# Patient Record
Sex: Female | Born: 1960 | Hispanic: No | Marital: Single | Smoking: Former smoker
Health system: Southern US, Community
[De-identification: ages and names within clinical notes are randomized; demographics above are authoritative.]

## PROBLEM LIST (undated history)

## (undated) DIAGNOSIS — N200 Calculus of kidney: Secondary | ICD-10-CM

## (undated) DIAGNOSIS — E785 Hyperlipidemia, unspecified: Secondary | ICD-10-CM

## (undated) DIAGNOSIS — J45909 Unspecified asthma, uncomplicated: Secondary | ICD-10-CM

## (undated) DIAGNOSIS — I10 Essential (primary) hypertension: Secondary | ICD-10-CM

## (undated) DIAGNOSIS — G4730 Sleep apnea, unspecified: Secondary | ICD-10-CM

## (undated) DIAGNOSIS — N189 Chronic kidney disease, unspecified: Secondary | ICD-10-CM

## (undated) DIAGNOSIS — E119 Type 2 diabetes mellitus without complications: Secondary | ICD-10-CM

## (undated) DIAGNOSIS — I8290 Acute embolism and thrombosis of unspecified vein: Secondary | ICD-10-CM

## (undated) HISTORY — PX: TUBAL LIGATION: SHX77

## (undated) HISTORY — DX: Calculus of kidney: N20.0

## (undated) HISTORY — PX: CHOLECYSTECTOMY: SHX55

## (undated) HISTORY — DX: Acute embolism and thrombosis of unspecified vein: I82.90

## (undated) HISTORY — PX: ABLATION: SHX5711

## (undated) HISTORY — PX: ABDOMINAL HYSTERECTOMY: SHX81

## (undated) HISTORY — PX: REDUCTION MAMMAPLASTY: SUR839

## (undated) HISTORY — PX: ROTATOR CUFF REPAIR: SHX139

## (undated) HISTORY — DX: Chronic kidney disease, unspecified: N18.9

## (undated) HISTORY — DX: Type 2 diabetes mellitus without complications: E11.9

---

## 2000-02-23 ENCOUNTER — Encounter: Admission: RE | Admit: 2000-02-23 | Discharge: 2000-02-23 | Payer: Self-pay | Admitting: Neurosurgery

## 2015-02-24 ENCOUNTER — Emergency Department
Admit: 2015-02-24 | Disposition: A | Discharge status: Home / Self Care | Payer: Self-pay | Admitting: Emergency Medicine

## 2015-02-24 ENCOUNTER — Encounter: Payer: Self-pay | Admitting: Physician Assistant

## 2015-02-24 IMAGING — CR RIGHT FOOT COMPLETE - 3+ VIEW
1 series · 3 of 3 positions shown · non-contrast
Comparison: None.

CLINICAL DATA: Fell outside on sidewalk around 6pm. Pain right
lateral foot.

EXAM:
RIGHT FOOT COMPLETE - 3+ VIEW; RIGHT ANKLE - COMPLETE 3+ VIEW

[Series 1: dxr foot rt complete w/obliques · 0.14mm/px · 3 of 3 slices shown]
[im 1/3]
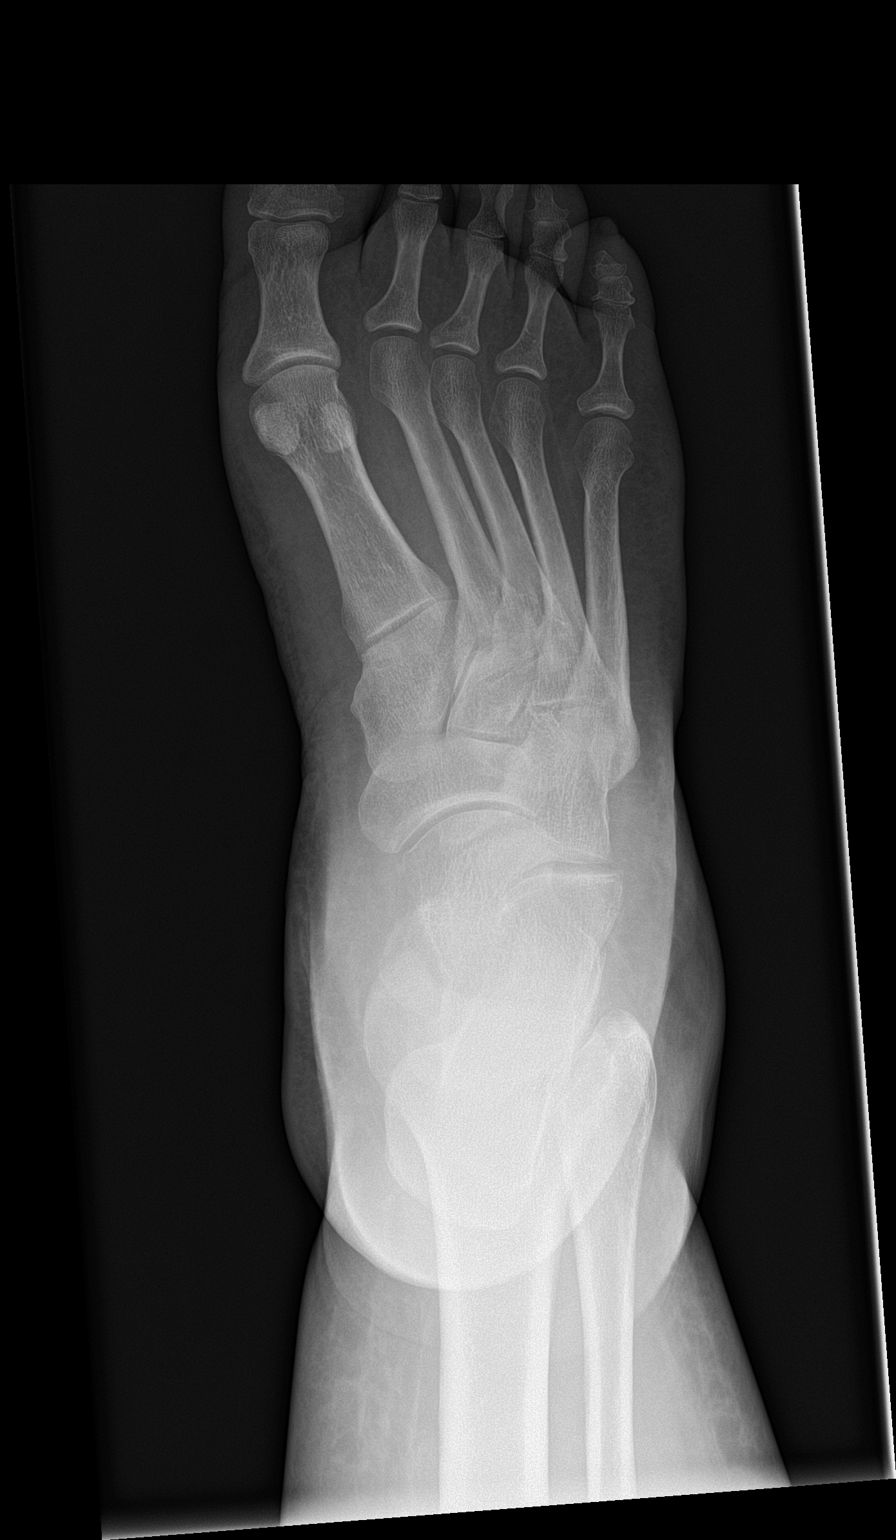
[im 2/3]
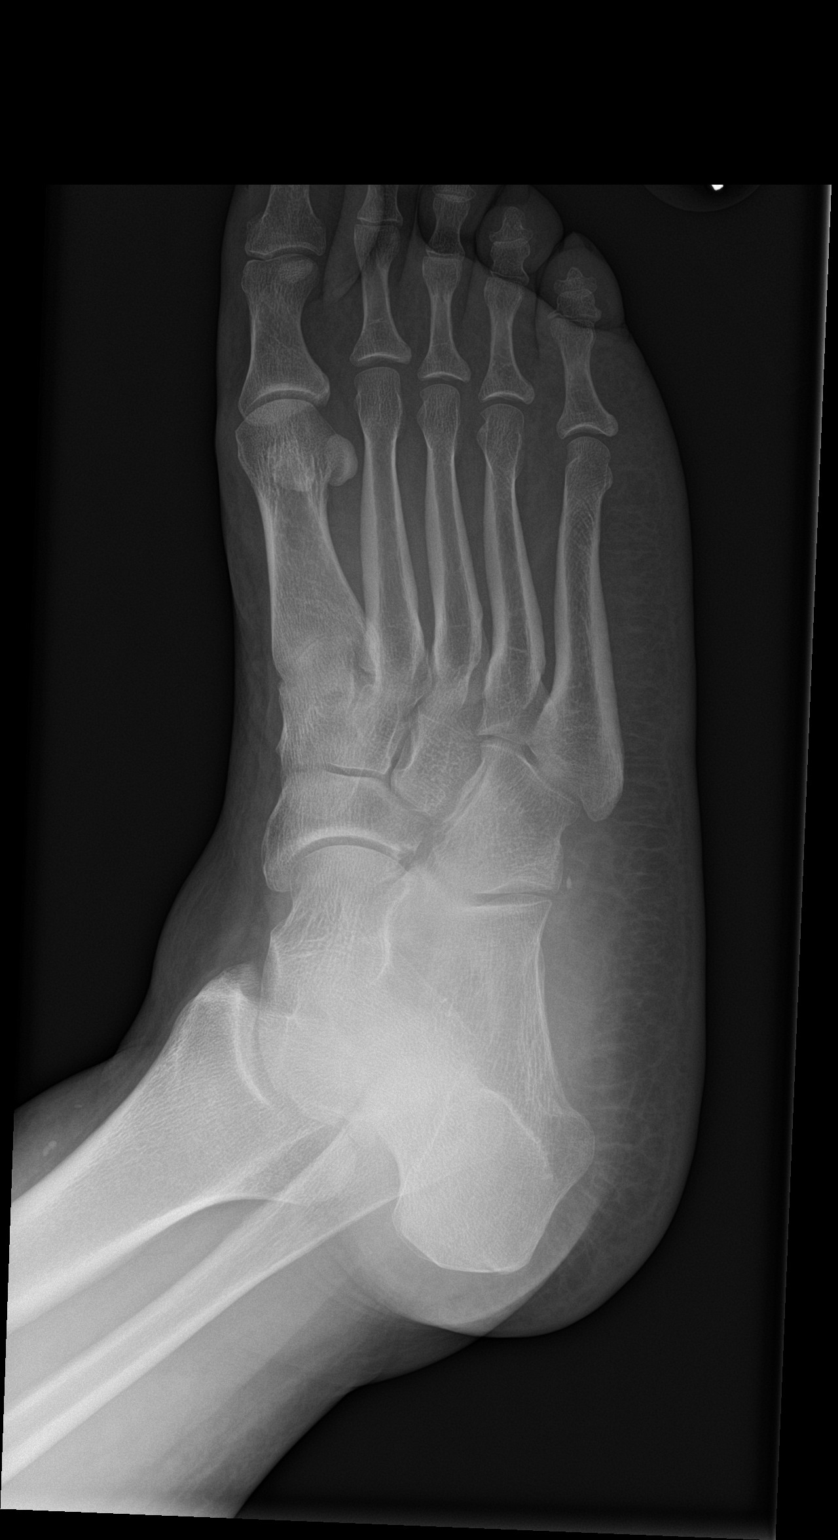
[im 3/3]
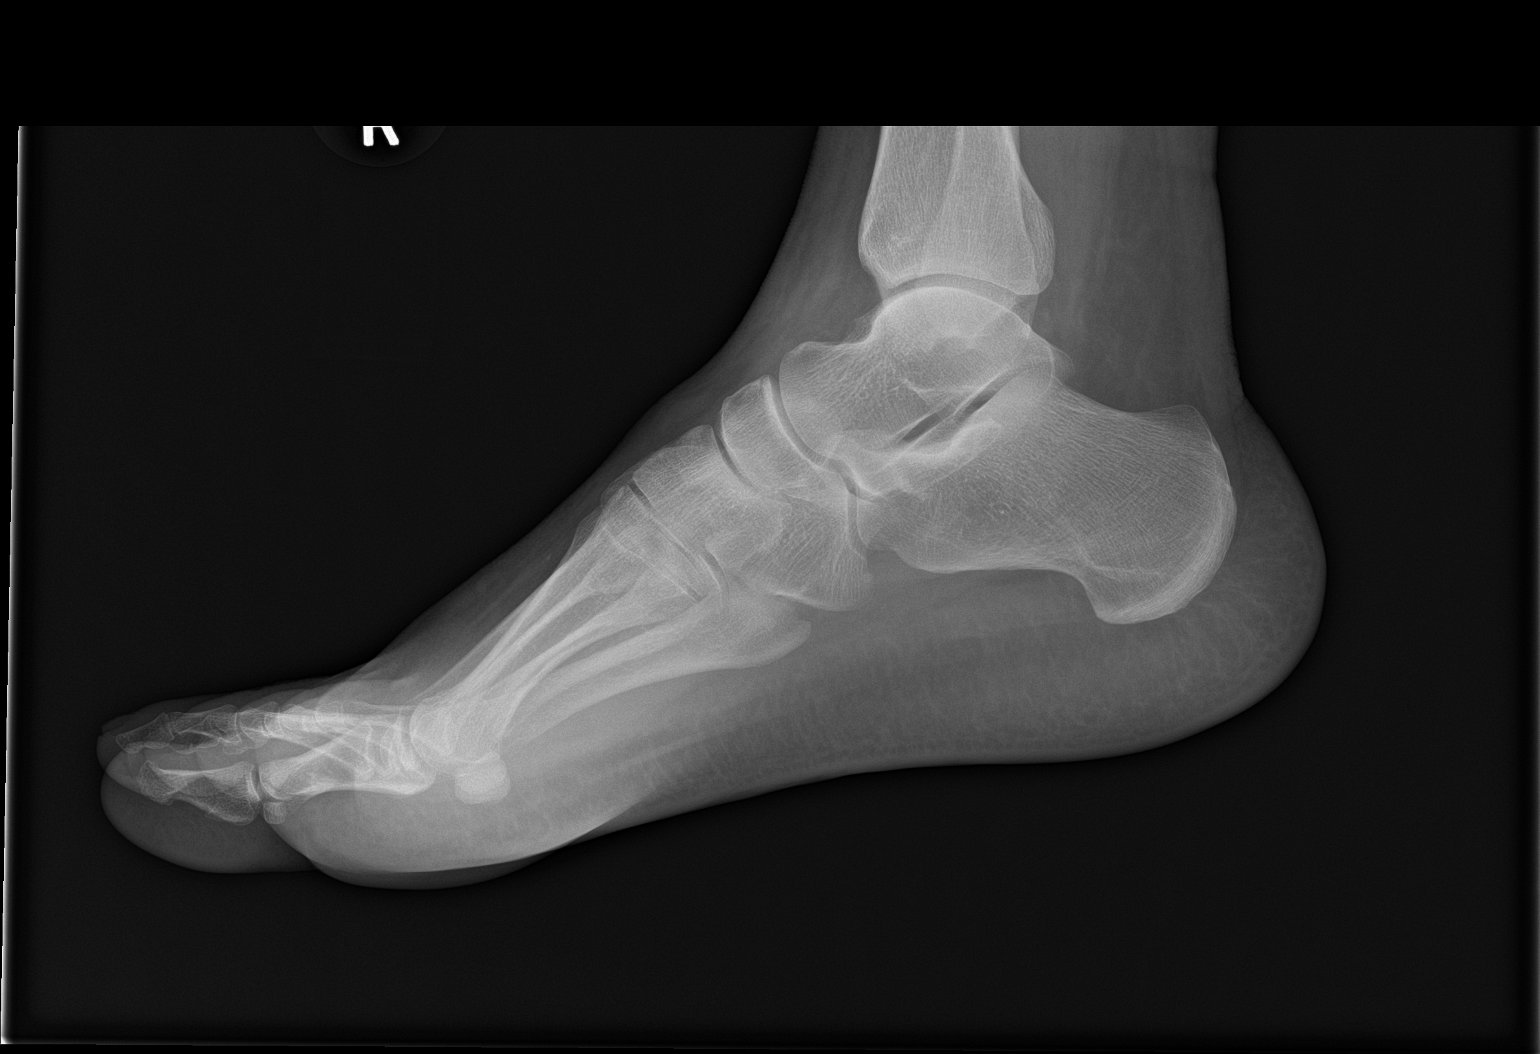

[3 of 3 positions shown; findings below may reference images not displayed]

FINDINGS: No fracture deformity nor dislocation. Tiny os perineum. The ankle
mortise appears congruent and the tibiofibular syndesmosis intact.
No destructive bony lesions. Lateral ankle soft tissue swelling
without subcutaneous gas or radiopaque foreign bodies. Phleboliths
in the pretibial soft tissues.
IMPRESSION: No acute osseous process.

By: Ohaila Crash

## 2015-02-24 IMAGING — CR DG KNEE COMPLETE 4+V*R*
1 series · 4 of 4 positions shown · non-contrast
Comparison: None.

CLINICAL DATA: pt fell on the side walk around 6pm. small scrape
anterior right knee, pain entire right knee, unable to bear weight.

EXAM:
RIGHT KNEE - COMPLETE 4+ VIEW

[Series 1: dxr knee rt comp with obliques · 0.14mm/px · 4 of 4 slices shown]
[im 1/4]
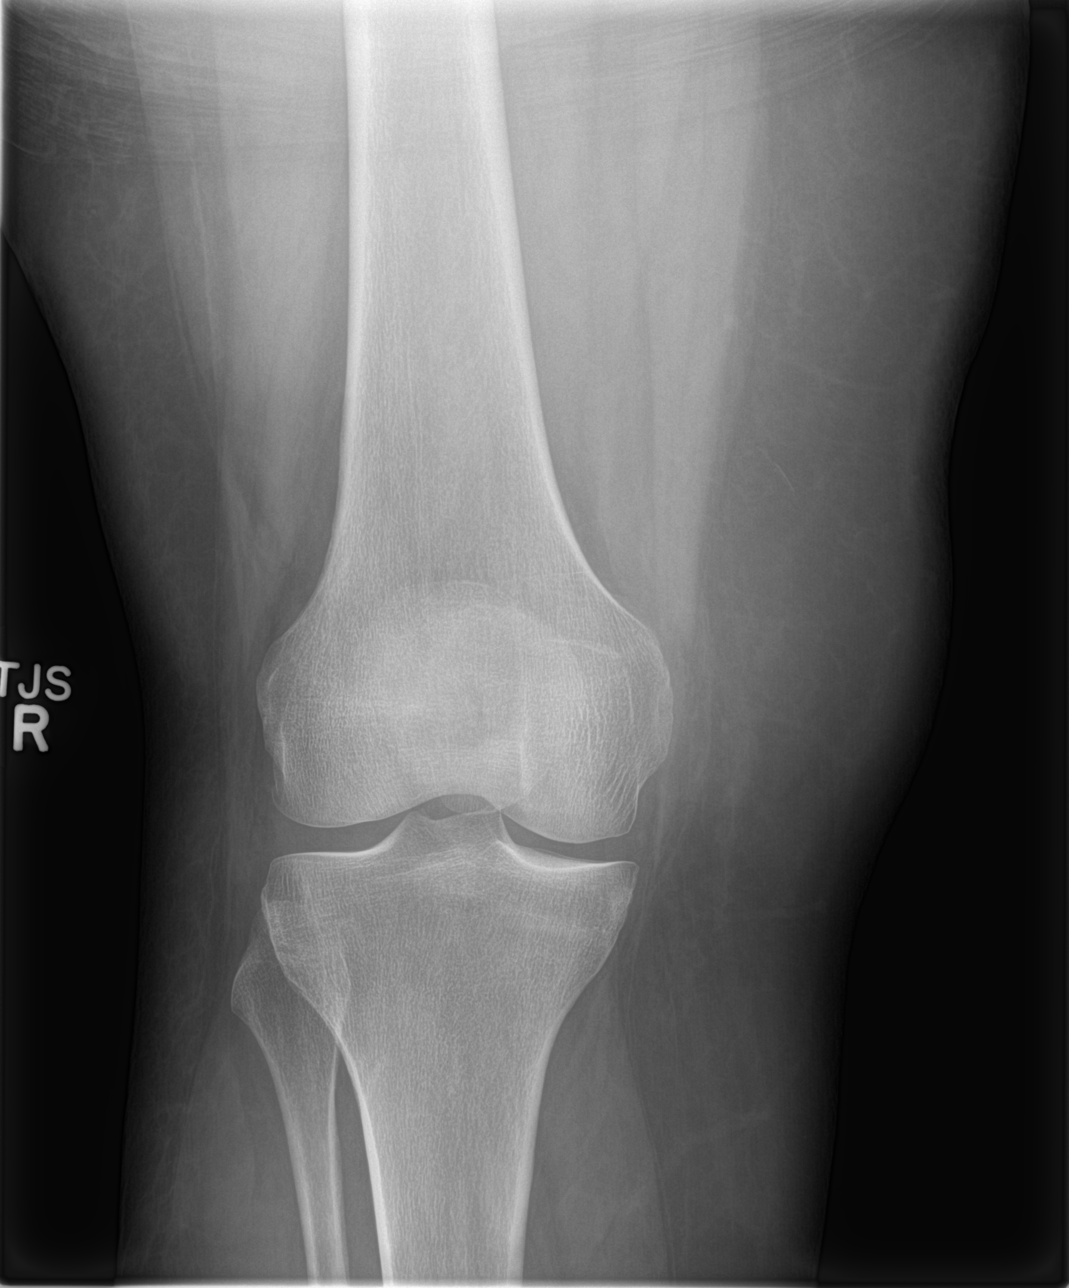
[im 2/4]
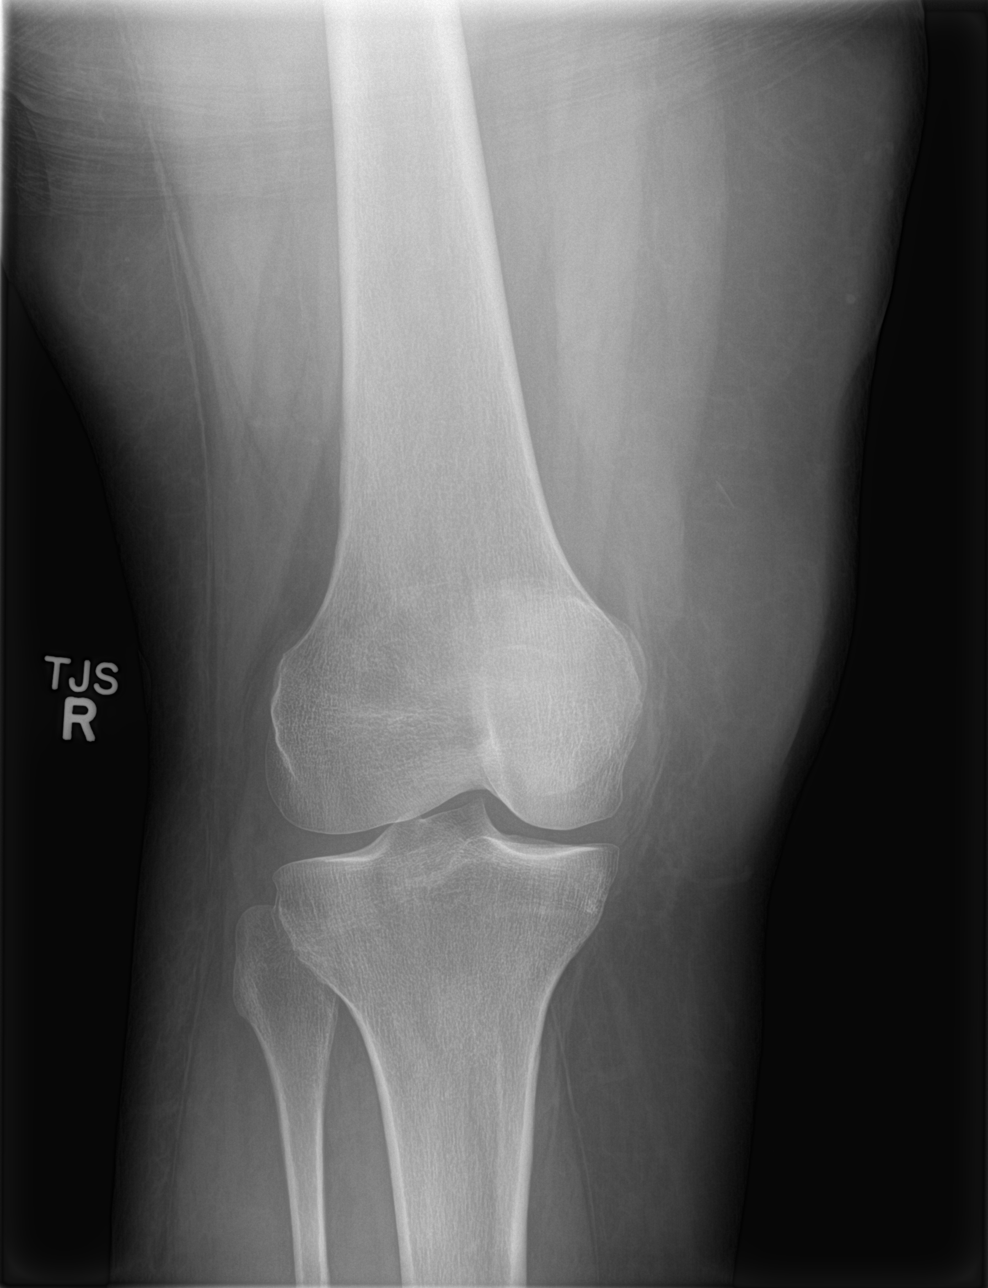
[im 3/4]
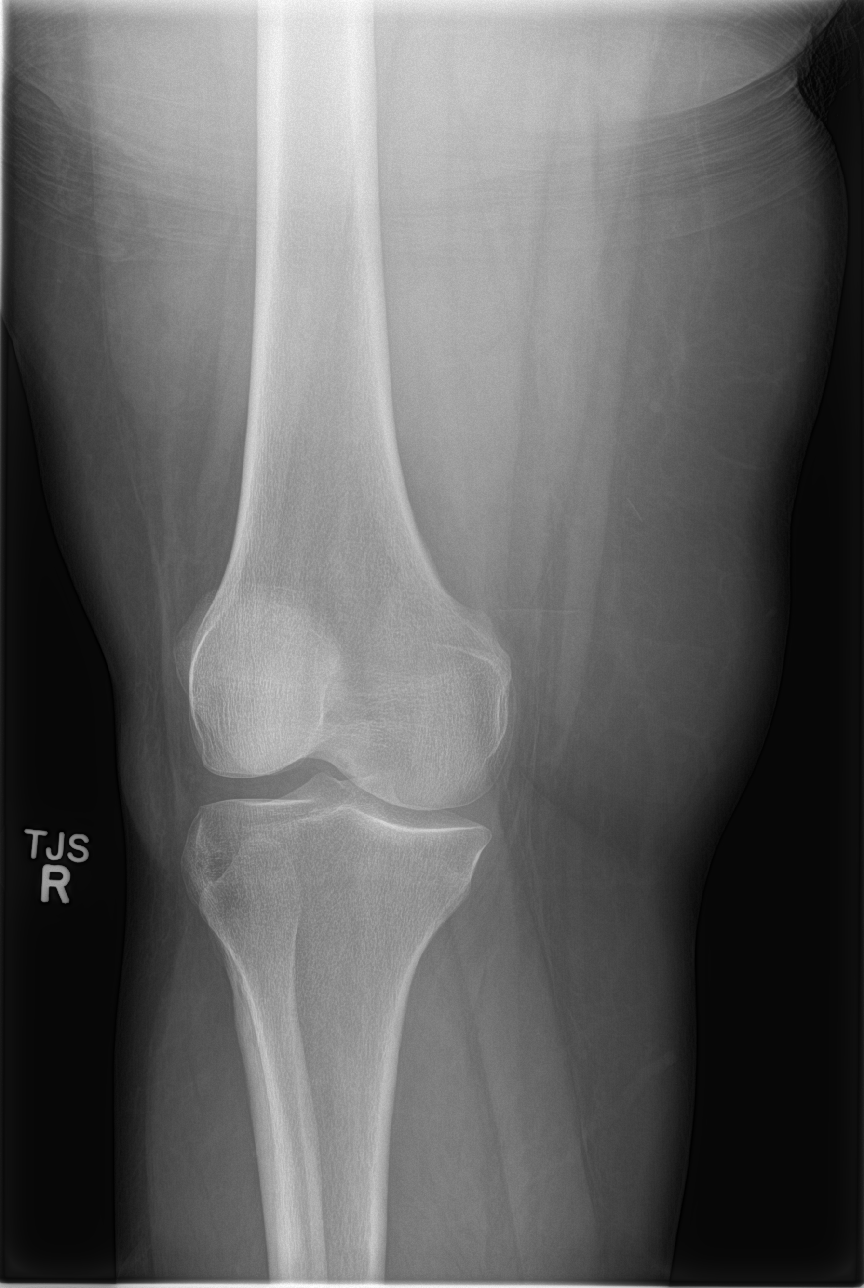
[im 4/4]
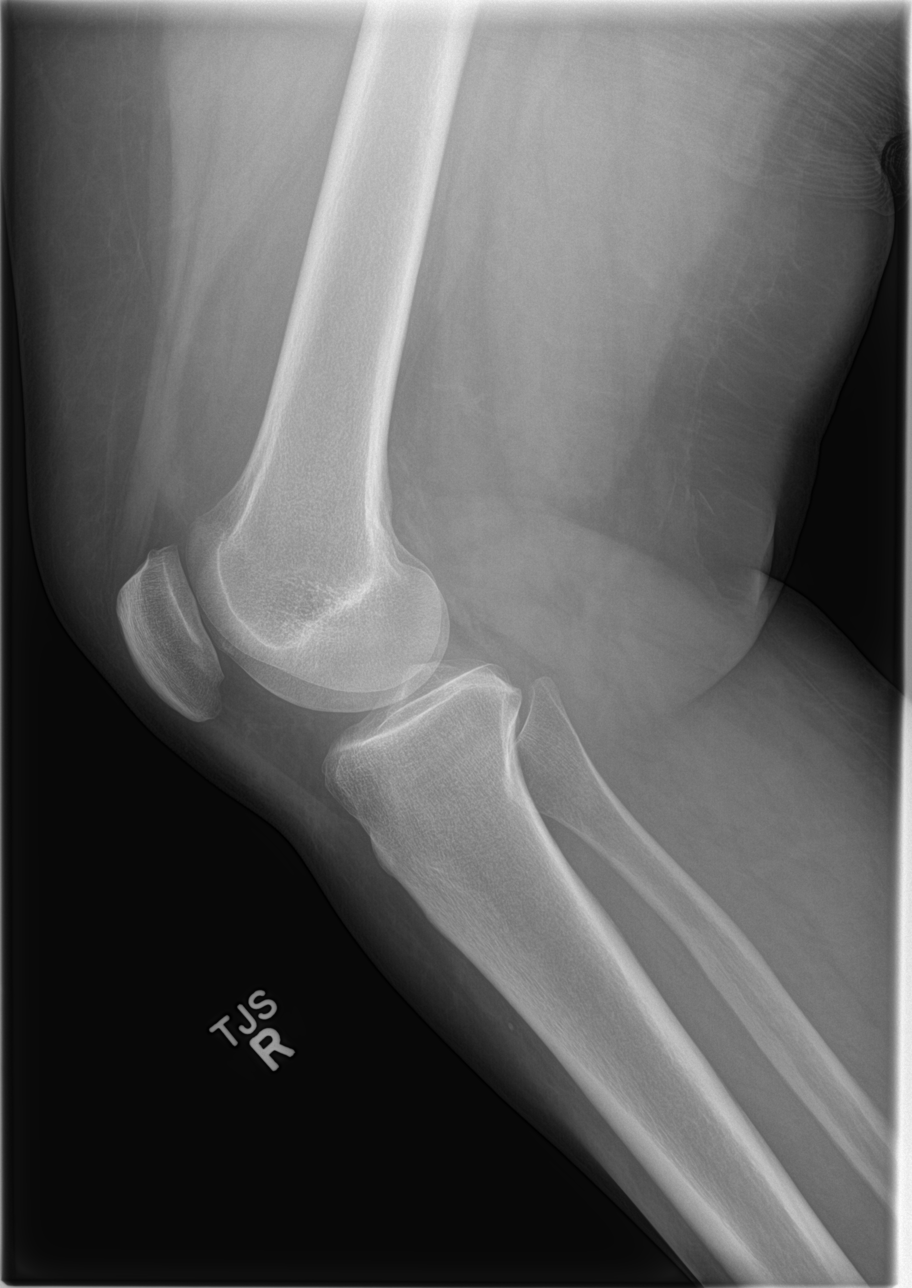

[4 of 4 positions shown; findings below may reference images not displayed]

FINDINGS: There is no evidence of fracture, dislocation, or joint effusion.
There is no evidence of arthropathy or other focal bone abnormality.
Soft tissues are unremarkable.
IMPRESSION: Negative.

## 2015-02-24 IMAGING — CR RIGHT ANKLE - COMPLETE 3+ VIEW
1 series · 3 of 3 positions shown · non-contrast
Comparison: None.

CLINICAL DATA: Fell outside on sidewalk around 6pm. Pain right
lateral foot.

EXAM:
RIGHT FOOT COMPLETE - 3+ VIEW; RIGHT ANKLE - COMPLETE 3+ VIEW

[Series 1: dxr ankle right complete · 0.14mm/px · 3 of 3 slices shown]
[im 1/3]
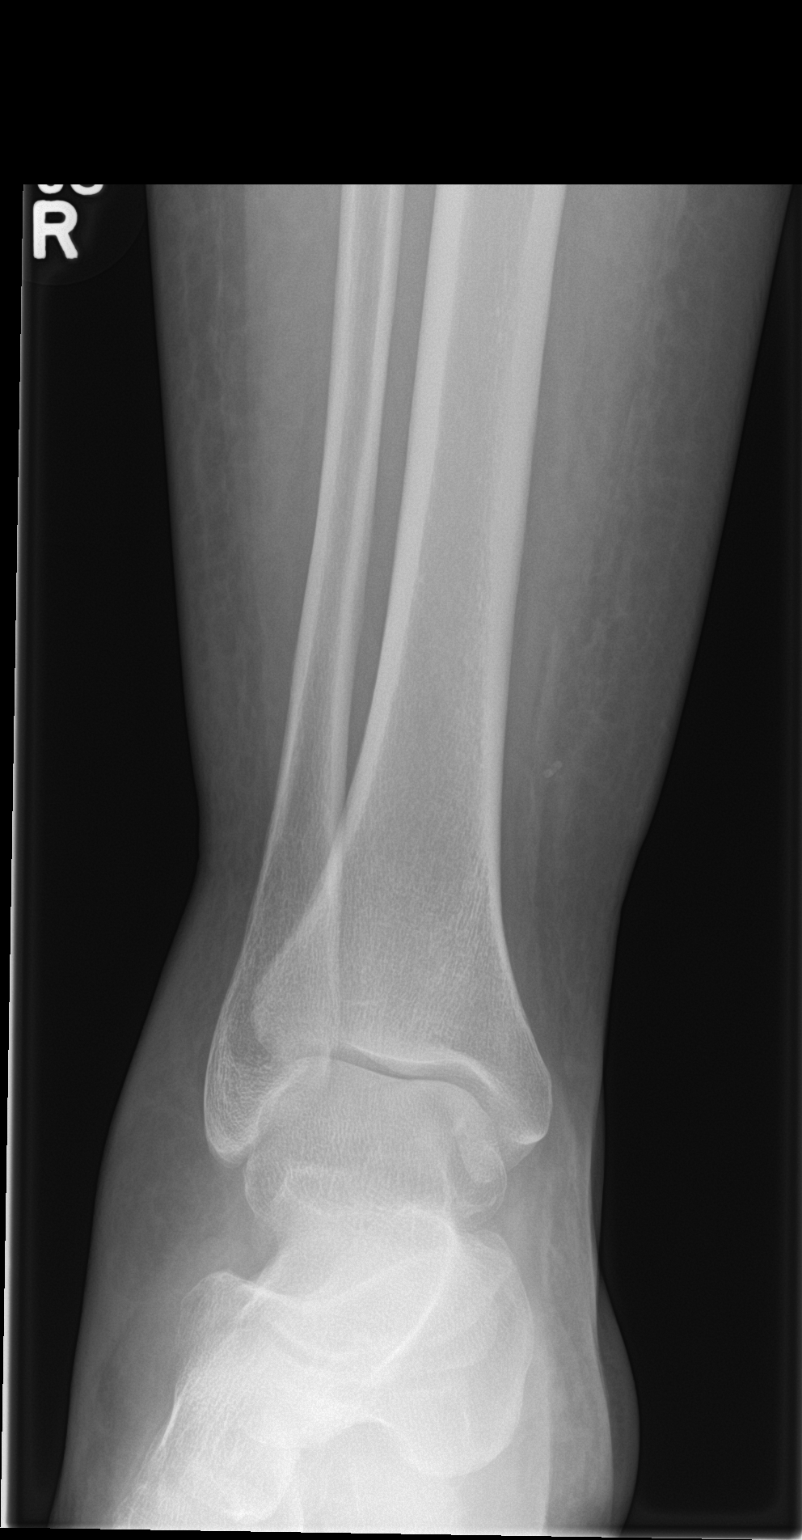
[im 2/3]
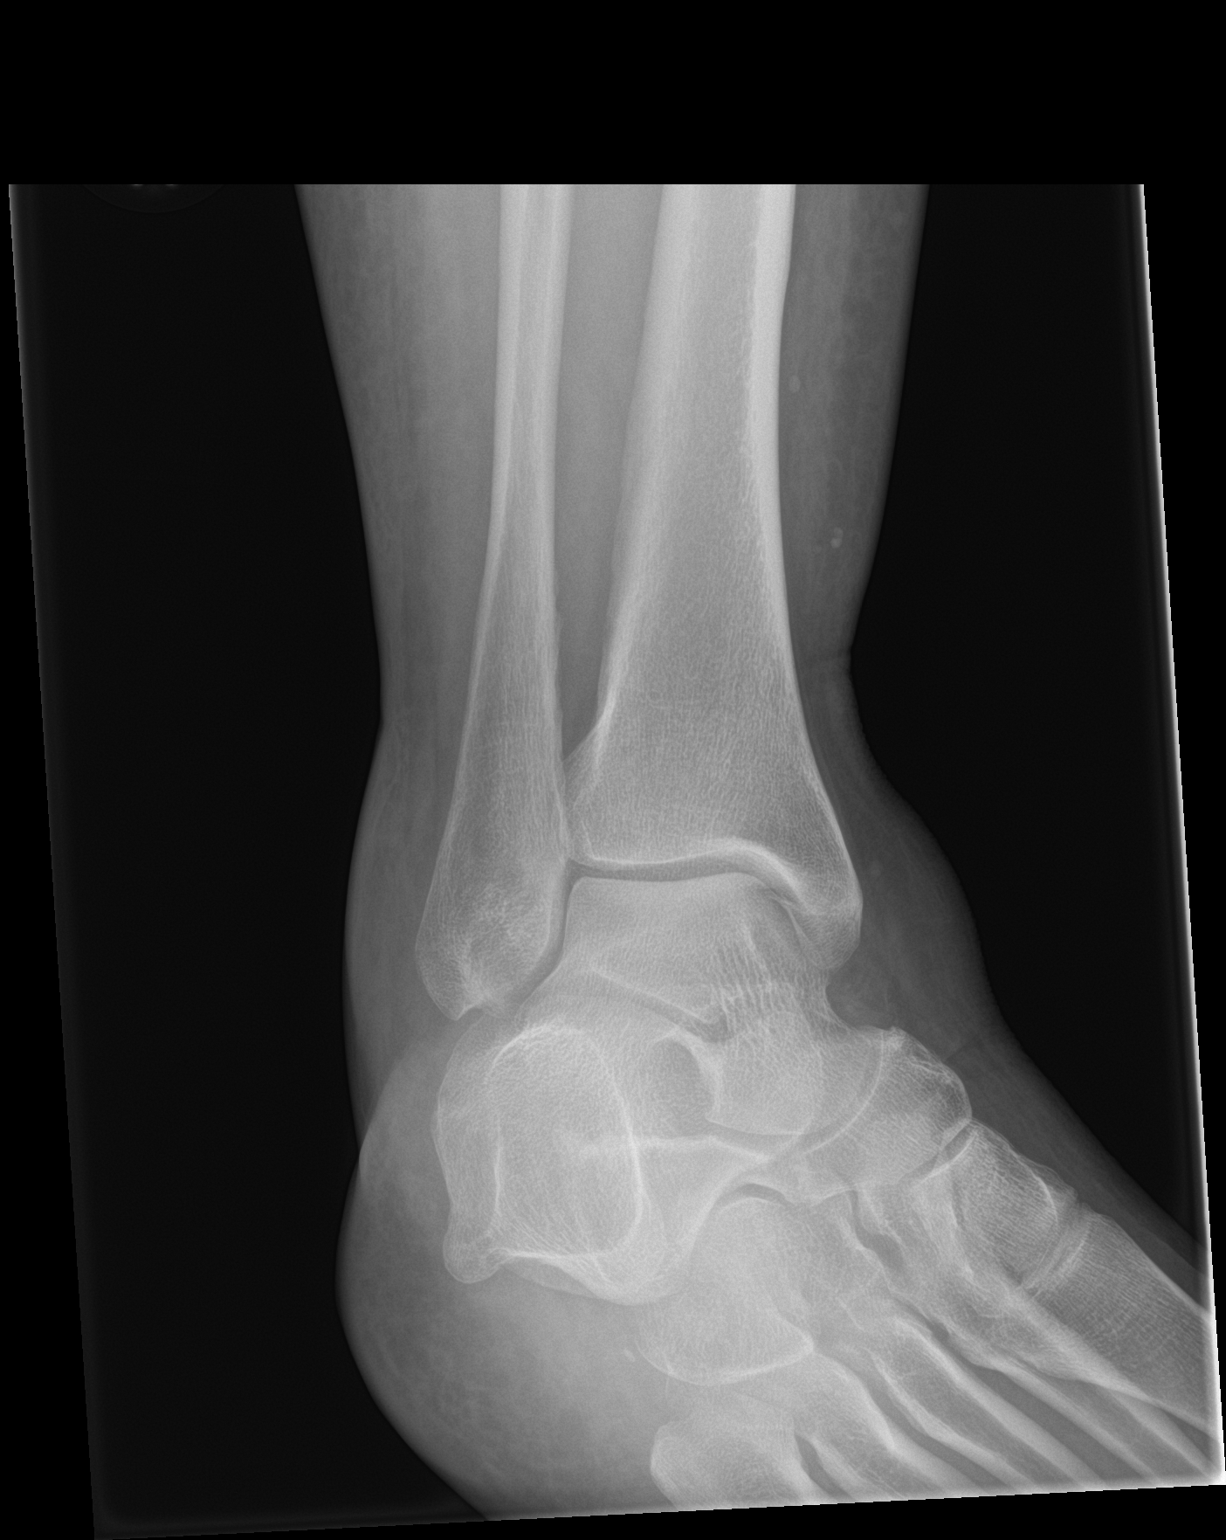
[im 3/3]
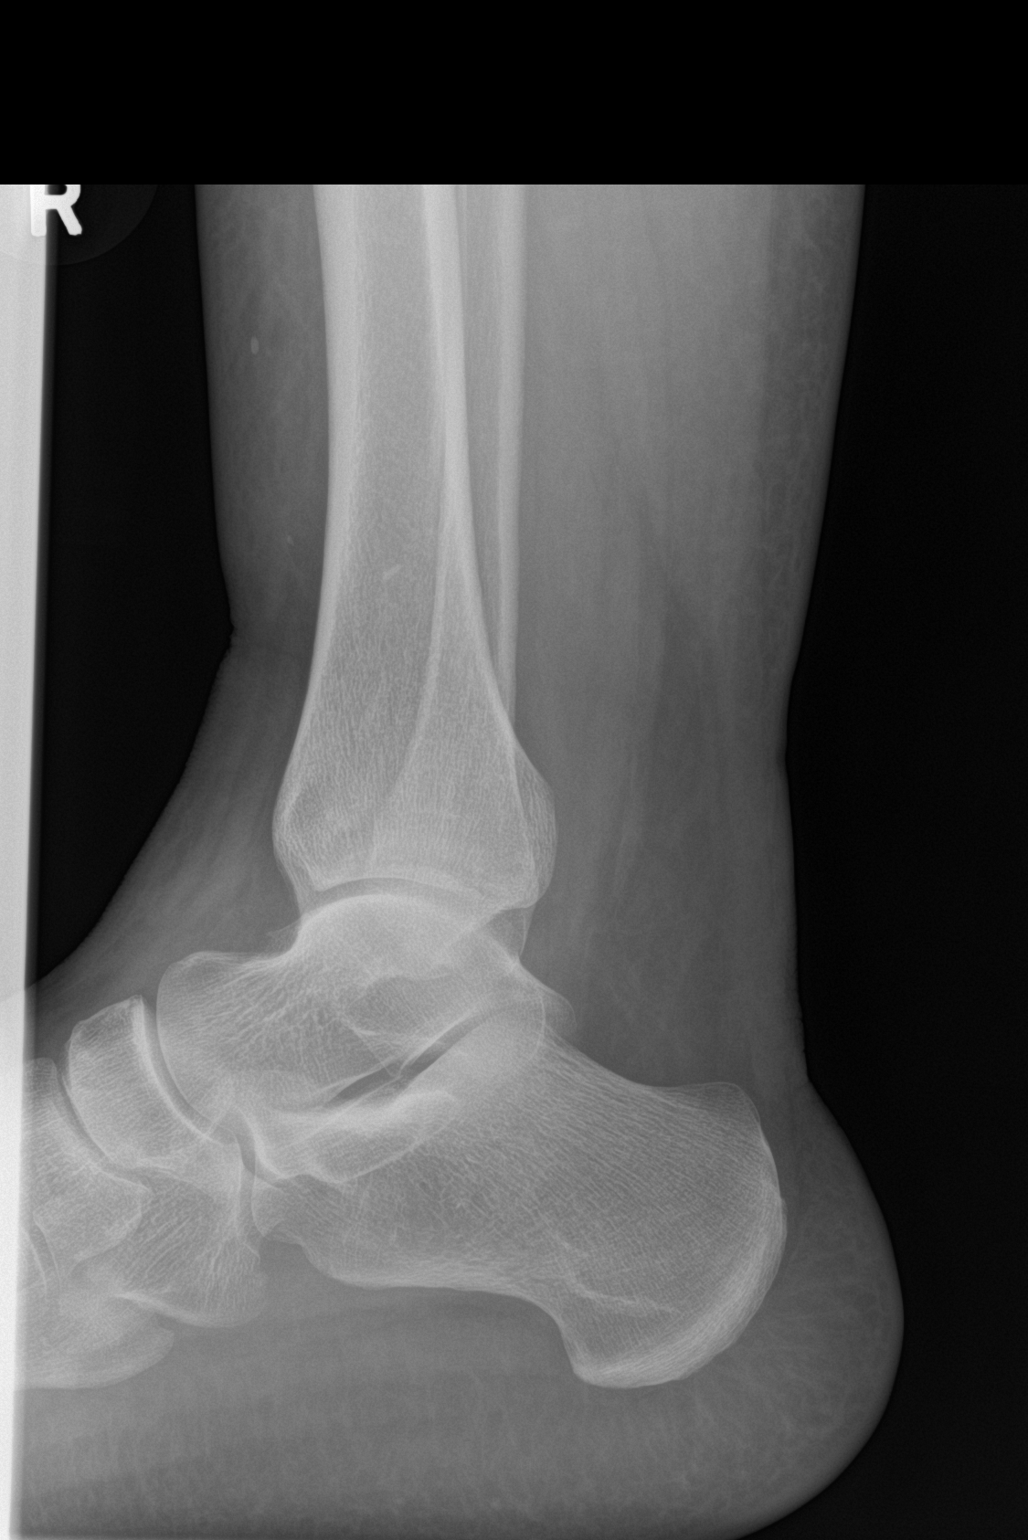

[3 of 3 positions shown; findings below may reference images not displayed]

FINDINGS: No fracture deformity nor dislocation. Tiny os perineum. The ankle
mortise appears congruent and the tibiofibular syndesmosis intact.
No destructive bony lesions. Lateral ankle soft tissue swelling
without subcutaneous gas or radiopaque foreign bodies. Phleboliths
in the pretibial soft tissues.
IMPRESSION: No acute osseous process.

By: Ohaila Crash

## 2015-10-07 ENCOUNTER — Emergency Department
Admission: EM | Admit: 2015-10-07 | Discharge: 2015-10-07 | Disposition: A | Discharge status: Home / Self Care | Payer: 59 | Attending: Emergency Medicine | Admitting: Emergency Medicine

## 2015-10-07 DIAGNOSIS — Z880 Allergy status to penicillin: Secondary | ICD-10-CM | POA: Diagnosis not present

## 2015-10-07 DIAGNOSIS — H6092 Unspecified otitis externa, left ear: Secondary | ICD-10-CM

## 2015-10-07 DIAGNOSIS — J320 Chronic maxillary sinusitis: Secondary | ICD-10-CM | POA: Diagnosis not present

## 2015-10-07 DIAGNOSIS — H6983 Other specified disorders of Eustachian tube, bilateral: Secondary | ICD-10-CM

## 2015-10-07 DIAGNOSIS — H6993 Unspecified Eustachian tube disorder, bilateral: Secondary | ICD-10-CM | POA: Insufficient documentation

## 2015-10-07 DIAGNOSIS — H9202 Otalgia, left ear: Secondary | ICD-10-CM | POA: Diagnosis present

## 2015-10-07 MED ORDER — FLUTICASONE PROPIONATE 50 MCG/ACT NA SUSP: 1.0000 | Freq: Two times a day (BID) | NASAL | Status: DC

## 2015-10-07 MED ORDER — CIPROFLOXACIN-DEXAMETHASONE 0.3-0.1 % OT SUSP
4.0000 [drp] | Freq: Once | OTIC | Status: AC
  Administered 2015-10-07: 4 [drp] via OTIC
  Filled 2015-10-07: qty 7.5

## 2015-10-07 MED ORDER — CIPROFLOXACIN-DEXAMETHASONE 0.3-0.1 % OT SUSP: 4.0000 [drp] | Freq: Two times a day (BID) | OTIC | Status: AC

## 2015-10-07 MED ORDER — CETIRIZINE HCL 10 MG PO TABS: 10.0000 mg | ORAL_TABLET | Freq: Every day | ORAL | Status: DC

## 2015-10-07 NOTE — ED Notes (Signed)
Pt presents with c/o left earache started Sunday and continues to throb and hurts when she swallows. Denies any drainage from left ear. Pt has been taking some tylenol with no relief.

## 2015-10-07 NOTE — ED Provider Notes (Signed)
[facility] Emergency Department Provider Note  ____________________________________________  Time seen: Approximately 9:09 AM  I have reviewed the triage vital signs and the nursing notes.   HISTORY  Chief Complaint Otalgia    HPI Shannon Bryan is a 54 y.o. female who presents to the emergency department complaining of sharp left ear pain 2 days. She states that symptoms began insidiously and have greatly increased over the last 2 days. She states that pain is sharp, constant, worse with palpation over the the external ear. She denies any headache, visual acuity changes, sore throat, difficulty breathing,. She does endorse chronic nasal congestion and mild intermittent cough. Patient denies any barotrauma to hear. She denies any drainage from ear   No past medical history on file.  There are no active problems to display for this patient.   No past surgical history on file.  Current Outpatient Rx  Name  Route  Sig  Dispense  Refill  . cetirizine (ZYRTEC) 10 MG tablet   Oral   Take 1 tablet (10 mg total) by mouth daily.   30 tablet   0   . ciprofloxacin-dexamethasone (CIPRODEX) otic suspension   Left Ear   Place 4 drops into the left ear 2 (two) times daily.   7.5 mL   0   . fluticasone (FLONASE) 50 MCG/ACT nasal spray   Each Nare   Place 1 spray into both nostrils 2 (two) times daily.   16 g   0     Allergies Bactrim and Penicillins  No family history on file.  Social History Social History  Substance Use Topics  . Smoking status: Not on file  . Smokeless tobacco: Not on file  . Alcohol Use: Not on file    Review of Systems Constitutional: No fever/chills Eyes: No visual changes. ENT: No sore throat. Endorses left ear pain. Endorses chronic nasal congestion. Cardiovascular: Denies chest pain. Respiratory: Denies shortness of breath. Gastrointestinal: No abdominal pain.  No nausea, no vomiting.  No diarrhea.  No  constipation. Genitourinary: Negative for dysuria. Musculoskeletal: Negative for back pain. Skin: Negative for rash. Neurological: Negative for headaches, focal weakness or numbness.  10-point ROS otherwise negative.  ____________________________________________   PHYSICAL EXAM:  VITAL SIGNS: ED Triage Vitals  Enc Vitals Group     BP 10/07/15 0837 144/92 mmHg     Pulse Rate 10/07/15 0837 65     Resp 10/07/15 0837 18     Temp 10/07/15 0837 98.7 F (37.1 C)     Temp Source 10/07/15 0837 Oral     SpO2 10/07/15 0837 96 %     Weight 10/07/15 0837 297 lb (134.718 kg)     Height 10/07/15 0837 5\' 9"  (1.753 m)     Head Cir --      Peak Flow --      Pain Score 10/07/15 0837 10     Pain Loc --      Pain Edu? --      Excl. in [place]? --     Constitutional: Alert and oriented. Well appearing and in no acute distress. Eyes: Conjunctivae are normal. PERRL. EOMI. Head: Atraumatic. Ears: External auditory canal  On right side is unremarkable. TM is visualized and is mildly bulging. No air-fluid level noted. External auditory canal is edematous and erythematous. External auditory canal is constricted but TM is visualized. TM is visualized and is mildly bulging with no air-fluid level. Nose: Mild congestion/rhinnorhea. Turbinates are boggy. Mouth/Throat: Mucous membranes are moist.  Oropharynx non-erythematous. Neck: No stridor.   Hematological/Lymphatic/Immunilogical: No cervical lymphadenopathy. Cardiovascular: Normal rate, regular rhythm. Grossly normal heart sounds.  Good peripheral circulation. Respiratory: Normal respiratory effort.  No retractions. Lungs CTAB. Gastrointestinal: Soft and nontender. No distention. No abdominal bruits. No CVA tenderness. Musculoskeletal: No lower extremity tenderness nor edema.  No joint effusions. Neurologic:  Normal speech and language. No gross focal neurologic deficits are appreciated. No gait instability. Skin:  Skin is warm, dry and intact. No rash  noted. Psychiatric: Mood and affect are normal. Speech and behavior are normal.  ____________________________________________   LABS (all labs ordered are listed, but only abnormal results are displayed)  Labs Reviewed - No data to display ____________________________________________  EKG   ____________________________________________  RADIOLOGY   ____________________________________________   PROCEDURES  Procedure(s) performed: None  Critical Care performed: No  ____________________________________________   INITIAL IMPRESSION / ASSESSMENT AND PLAN / ED COURSE  Pertinent labs & imaging results that were available during my care of the patient were reviewed by me and considered in my medical decision making (see chart for details).  Patient's diagnosis is consistent with acute otitis externa to the left ear, chronic maxillary sinusitis, and eustachian tube dysfunction. Patient is given Ciprodex eardrops here in the emergency department. Patient will be discharged home with prescription for Ciprodex, Flonase, and Zyrtec. Patient is to follow-up with primary care should symptoms persist past treatment course. Patient verbalizes understanding of diagnosis and treatment plan and verbalizes compliance with same. ____________________________________________   FINAL CLINICAL IMPRESSION(S) / ED DIAGNOSES  Final diagnoses:  Otitis externa, left  Chronic maxillary sinusitis  Eustachian tube dysfunction, bilateral      Darletta Moll, PA-C 10/07/15 [id number]  Lavonia Drafts, MD 10/07/15 1056

## 2015-10-07 NOTE — Discharge Instructions (Signed)
Ear Drops, Adult °You have been diagnosed with a condition requiring you to put drops of medicine into your outer ear. °HOME CARE INSTRUCTIONS  °· Put drops in the affected ear as instructed. After putting the drops in, you will need to lie down with the affected ear facing up for ten minutes so the drops will remain in the ear canal and run down and fill the canal. Continue using the ear drops for as long as directed by your health care provider. °· Prior to getting up, put a cotton ball gently in your ear canal. Leave enough of the cotton ball out so it can be easily removed. Do not attempt to push this down into the canal with a cotton-tipped swab or other instrument. °· Do not irrigate or wash out your ears if you have had a perforated eardrum or mastoid surgery, or unless instructed to do so by your health care provider. °· Keep appointments with your health care provider as instructed. °· Finish all medicine, or use for the length of time prescribed by your health care provider. Continue the drops even if your problem seems to be doing well after a couple days, or continue as instructed. °SEEK MEDICAL CARE IF: °· You become worse or develop increasing pain. °· You notice any unusual drainage from your ear (particularly if the drainage has a bad smell). °· You develop hearing difficulties. °· You experience a serious form of dizziness in which you feel as if the room is spinning, and you feel nauseated (vertigo). °· The outside of your ear becomes red or swollen or both. This may be a sign of an allergic reaction. °MAKE SURE YOU:  °· Understand these instructions. °· Will watch your condition. °· Will get help right away if you are not doing well or get worse. °  °This information is not intended to replace advice given to you by your health care provider. Make sure you discuss any questions you have with your health care provider. °  °Document Released: 10/12/2001 Document Revised: 11/08/2014 Document  Reviewed: 05/15/2013 °Elsevier Interactive Patient Education ©2016 Elsevier Inc[employer] ° °Otitis Externa °Otitis externa is a bacterial or fungal infection of the outer ear canal. This is the area from the eardrum to the outside of the ear. Otitis externa is sometimes called "swimmer's ear." °CAUSES  °Possible causes of infection include: °· Swimming in dirty water. °· Moisture remaining in the ear after swimming or bathing. °· Mild injury (trauma) to the ear. °· Objects stuck in the ear (foreign body). °· Cuts or scrapes (abrasions) on the outside of the ear. °SIGNS AND SYMPTOMS  °The first symptom of infection is often itching in the ear canal. Later signs and symptoms may include swelling and redness of the ear canal, ear pain, and yellowish-white fluid (pus) coming from the ear. The ear pain may be worse when pulling on the earlobe. °DIAGNOSIS  °Your health care provider will perform a physical exam. A sample of fluid may be taken from the ear and examined for bacteria or fungi. °TREATMENT  °Antibiotic ear drops are often given for 10 to 14 days. Treatment may also include pain medicine or corticosteroids to reduce itching and swelling. °HOME CARE INSTRUCTIONS  °· Apply antibiotic ear drops to the ear canal as prescribed by your health care provider. °· Take medicines only as directed by your health care provider. °· If you have diabetes, follow any additional treatment instructions from your health care provider. °· Keep all follow-up visits   as directed by your health care provider. PREVENTION   Keep your ear dry. Use the corner of a towel to absorb water out of the ear canal after swimming or bathing.  Avoid scratching or putting objects inside your ear. This can damage the ear canal or remove the protective wax that lines the canal. This makes it easier for bacteria and fungi to grow.  Avoid swimming in lakes, polluted water, or poorly chlorinated pools.  You may use ear drops made of rubbing alcohol and  vinegar after swimming. Combine equal parts of white vinegar and alcohol in a bottle. Put 3 or 4 drops into each ear after swimming. SEEK MEDICAL CARE IF:   You have a fever.  Your ear is still red, swollen, painful, or draining pus after 3 days.  Your redness, swelling, or pain gets worse.  You have a severe headache.  You have redness, swelling, pain, or tenderness in the area behind your ear. MAKE SURE YOU:   Understand these instructions.  Will watch your condition.  Will get help right away if you are not doing well or get worse.   This information is not intended to replace advice given to you by your health care provider. Make sure you discuss any questions you have with your health care provider.   Document Released: 10/18/2005 Document Revised: 11/08/2014 Document Reviewed: 11/04/2011 Elsevier Interactive Patient Education 2016 Elsevier Inc.  Sinusitis, Adult Sinusitis is redness, soreness, and inflammation of the paranasal sinuses. Paranasal sinuses are air pockets within the bones of your face. They are located beneath your eyes, in the middle of your forehead, and above your eyes. In healthy paranasal sinuses, mucus is able to drain out, and air is able to circulate through them by way of your nose. However, when your paranasal sinuses are inflamed, mucus and air can become trapped. This can allow bacteria and other germs to grow and cause infection. Sinusitis can develop quickly and last only a short time (acute) or continue over a long period (chronic). Sinusitis that lasts for more than 12 weeks is considered chronic. CAUSES Causes of sinusitis include:  Allergies.  Structural abnormalities, such as displacement of the cartilage that separates your nostrils (deviated septum), which can decrease the air flow through your nose and sinuses and affect sinus drainage.  Functional abnormalities, such as when the small hairs (cilia) that line your sinuses and help remove  mucus do not work properly or are not present. SIGNS AND SYMPTOMS Symptoms of acute and chronic sinusitis are the same. The primary symptoms are pain and pressure around the affected sinuses. Other symptoms include:  Upper toothache.  Earache.  Headache.  Bad breath.  Decreased sense of smell and taste.  A cough, which worsens when you are lying flat.  Fatigue.  Fever.  Thick drainage from your nose, which often is green and may contain pus (purulent).  Swelling and warmth over the affected sinuses. DIAGNOSIS Your health care provider will perform a physical exam. During your exam, your health care provider may perform any of the following to help determine if you have acute sinusitis or chronic sinusitis:  Look in your nose for signs of abnormal growths in your nostrils (nasal polyps).  Tap over the affected sinus to check for signs of infection.  View the inside of your sinuses using an imaging device that has a light attached (endoscope). If your health care provider suspects that you have chronic sinusitis, one or more of the following tests may  be recommended:  Allergy tests.  Nasal culture. A sample of mucus is taken from your nose, sent to a lab, and screened for bacteria.  Nasal cytology. A sample of mucus is taken from your nose and examined by your health care provider to determine if your sinusitis is related to an allergy. TREATMENT Most cases of acute sinusitis are related to a viral infection and will resolve on their own within 10 days. Sometimes, medicines are prescribed to help relieve symptoms of both acute and chronic sinusitis. These may include pain medicines, decongestants, nasal steroid sprays, or saline sprays. However, for sinusitis related to a bacterial infection, your health care provider will prescribe antibiotic medicines. These are medicines that will help kill the bacteria causing the infection. Rarely, sinusitis is caused by a fungal  infection. In these cases, your health care provider will prescribe antifungal medicine. For some cases of chronic sinusitis, surgery is needed. Generally, these are cases in which sinusitis recurs more than 3 times per year, despite other treatments. HOME CARE INSTRUCTIONS  Drink plenty of water. Water helps thin the mucus so your sinuses can drain more easily.  Use a humidifier.  Inhale steam 3-4 times a day (for example, sit in the bathroom with the shower running).  Apply a warm, moist washcloth to your face 3-4 times a day, or as directed by your health care provider.  Use saline nasal sprays to help moisten and clean your sinuses.  Take medicines only as directed by your health care provider.  If you were prescribed either an antibiotic or antifungal medicine, finish it all even if you start to feel better. SEEK IMMEDIATE MEDICAL CARE IF:  You have increasing pain or severe headaches.  You have nausea, vomiting, or drowsiness.  You have swelling around your face.  You have vision problems.  You have a stiff neck.  You have difficulty breathing.   This information is not intended to replace advice given to you by your health care provider. Make sure you discuss any questions you have with your health care provider.   Document Released: 10/18/2005 Document Revised: 11/08/2014 Document Reviewed: 11/02/2011 Elsevier Interactive Patient Education [employer].

## 2015-10-07 NOTE — ED Notes (Signed)
Pt ambulatory with steady gait; talking in complete coherent sentences; pt c/o left earache; says it started Sunday afternoon; feels swollen inside; says the pain is making her throat hurt and also painful to swallow

## 2015-11-24 ENCOUNTER — Encounter: Payer: Self-pay | Admitting: Emergency Medicine

## 2015-11-24 ENCOUNTER — Emergency Department
Admission: EM | Admit: 2015-11-24 | Discharge: 2015-11-24 | Disposition: A | Discharge status: Home / Self Care | Payer: 59 | Attending: Emergency Medicine | Admitting: Emergency Medicine

## 2015-11-24 DIAGNOSIS — Z79899 Other long term (current) drug therapy: Secondary | ICD-10-CM | POA: Diagnosis not present

## 2015-11-24 DIAGNOSIS — Z7951 Long term (current) use of inhaled steroids: Secondary | ICD-10-CM | POA: Insufficient documentation

## 2015-11-24 DIAGNOSIS — L03312 Cellulitis of back [any part except buttock]: Secondary | ICD-10-CM | POA: Insufficient documentation

## 2015-11-24 DIAGNOSIS — I10 Essential (primary) hypertension: Secondary | ICD-10-CM | POA: Insufficient documentation

## 2015-11-24 DIAGNOSIS — J340 Abscess, furuncle and carbuncle of nose: Secondary | ICD-10-CM

## 2015-11-24 DIAGNOSIS — Z880 Allergy status to penicillin: Secondary | ICD-10-CM | POA: Insufficient documentation

## 2015-11-24 DIAGNOSIS — F17200 Nicotine dependence, unspecified, uncomplicated: Secondary | ICD-10-CM | POA: Diagnosis not present

## 2015-11-24 DIAGNOSIS — J329 Chronic sinusitis, unspecified: Secondary | ICD-10-CM | POA: Insufficient documentation

## 2015-11-24 DIAGNOSIS — L02212 Cutaneous abscess of back [any part, except buttock]: Secondary | ICD-10-CM | POA: Diagnosis present

## 2015-11-24 HISTORY — DX: Hyperlipidemia, unspecified: E78.5

## 2015-11-24 HISTORY — DX: Essential (primary) hypertension: I10

## 2015-11-24 MED ORDER — IBUPROFEN 800 MG PO TABS: 800.0000 mg | ORAL_TABLET | Freq: Three times a day (TID) | ORAL | Status: DC | PRN

## 2015-11-24 MED ORDER — MUPIROCIN 2 % EX OINT: TOPICAL_OINTMENT | CUTANEOUS | Status: AC

## 2015-11-24 MED ORDER — DOXYCYCLINE HYCLATE 100 MG PO CAPS: 100.0000 mg | ORAL_CAPSULE | Freq: Two times a day (BID) | ORAL | Status: DC

## 2015-11-24 MED ORDER — HYDROCODONE-ACETAMINOPHEN 5-325 MG PO TABS: 1.0000 | ORAL_TABLET | ORAL | Status: DC | PRN

## 2015-11-24 NOTE — Discharge Instructions (Signed)

## 2015-11-24 NOTE — ED Notes (Signed)
Pt presents with reddened swollen area to back. Pt states feels like she has a boil inside her nostril.

## 2015-11-24 NOTE — ED Provider Notes (Signed)
[facility] Emergency Department Provider Note  ____________________________________________  Time seen: Approximately 11:25 AM  I have reviewed the triage vital signs and the nursing notes.   HISTORY  Chief Complaint Abscess  HPI Shannon Bryan is a 55 y.o. female presents to the emergency department for evaluation of abscess to her back and inside her left nare. Symptoms started on Friday. She states that her back started itching and when she scratched it, she noticed that it was sore. The area has increased in size. She also has a small abscess inside the left nare.    Past Medical History  Diagnosis Date  . Hypertension   . Hyperlipidemia     There are no active problems to display for this patient.   Past Surgical History  Procedure Laterality Date  . Rotator cuff repair    . Cholecystectomy    . Tubal ligation      Current Outpatient Rx  Name  Route  Sig  Dispense  Refill  . cetirizine (ZYRTEC) 10 MG tablet   Oral   Take 1 tablet (10 mg total) by mouth daily.   30 tablet   0   . doxycycline (VIBRAMYCIN) 100 MG capsule   Oral   Take 1 capsule (100 mg total) by mouth 2 (two) times daily.   20 capsule   0   . fluticasone (FLONASE) 50 MCG/ACT nasal spray   Each Nare   Place 1 spray into both nostrils 2 (two) times daily.   16 g   0   . HYDROcodone-acetaminophen (NORCO/VICODIN) 5-325 MG tablet   Oral   Take 1 tablet by mouth every 4 (four) hours as needed for moderate pain.   20 tablet   0   . ibuprofen (ADVIL,MOTRIN) 800 MG tablet   Oral   Take 1 tablet (800 mg total) by mouth every 8 (eight) hours as needed.   30 tablet   0   . mupirocin ointment (BACTROBAN) 2 %      Apply to affected area 3 times daily   22 g   0     Allergies Bactrim and Penicillins  No family history on file.  Social History Social History  Substance Use Topics  . Smoking status: Current Some Day Smoker  . Smokeless tobacco: Former Systems developer   . Alcohol Use: Yes     Comment: occasional    Review of Systems   Constitutional: No fever/chills Eyes: No visual changes. ENT: No congestion or rhinorrhea Cardiovascular: Denies chest pain. Respiratory: Denies shortness of breath. Gastrointestinal: No abdominal pain.  No nausea, no vomiting.  No diarrhea.  No constipation. Genitourinary: Negative for dysuria. Musculoskeletal: Negative for back pain. Skin: Abscess to the left back and inside the nostril. Neurological: Negative for headaches, focal weakness or numbness.  10-point ROS otherwise negative.  ____________________________________________   PHYSICAL EXAM:  VITAL SIGNS: ED Triage Vitals  Enc Vitals Group     BP 11/24/15 1053 150/83 mmHg     Pulse Rate 11/24/15 1053 68     Resp 11/24/15 1053 20     Temp 11/24/15 1053 98.3 F (36.8 C)     Temp Source 11/24/15 1053 Oral     SpO2 11/24/15 1053 100 %     Weight 11/24/15 1053 330 lb (149.687 kg)     Height 11/24/15 1053 5\' 9"  (1.753 m)     Head Cir --      Peak Flow --      Pain Score 11/24/15  1056 6     Pain Loc --      Pain Edu? --      Excl. in [place]? --     Constitutional: Alert and oriented. Well appearing and in no acute distress. Eyes: Conjunctivae are normal. PERRL. EOMI. Head: Atraumatic. Nose: No congestion/rhinnorhea. Mouth/Throat: Mucous membranes are moist.  Oropharynx non-erythematous. No oral lesions. Neck: No stridor. Cardiovascular: Normal rate, regular rhythm.  Good peripheral circulation. Respiratory: Normal respiratory effort.  No retractions. Lungs CTAB. Gastrointestinal: Soft and nontender. No distention. No abdominal bruits.  Musculoskeletal: No lower extremity tenderness nor edema.  No joint effusions. Neurologic:  Normal speech and language. No gross focal neurologic deficits are appreciated. Speech is normal. No gait instability. Skin:  Approximately 4 cm area of induration noted to the right mid back with surrounding erythema, but no  fluctuance. Small papule noted inside of the left nare with edematous nasal mucosa noted.  Psychiatric: Mood and affect are normal. Speech and behavior are normal.  ____________________________________________   LABS (all labs ordered are listed, but only abnormal results are displayed)  Labs Reviewed - No data to display ____________________________________________  EKG   ____________________________________________  RADIOLOGY  Not indicated ____________________________________________   PROCEDURES  Procedure(s) performed: None ____________________________________________   INITIAL IMPRESSION / ASSESSMENT AND PLAN / ED COURSE  Pertinent labs & imaging results that were available during my care of the patient were reviewed by me and considered in my medical decision making (see chart for details).  Patient was advised to return to the emergency department or follow-up with the primary care provider of her choice for symptoms that are not improving over the next 48 hours. She was advised to return to emergency department if she develops a fever. She was encouraged to take all of the antibiotic as prescribed. ____________________________________________   FINAL CLINICAL IMPRESSION(S) / ED DIAGNOSES  Final diagnoses:  Cellulitis of skin of back  Abscess of nose       Victorino Dike, FNP 11/24/15 1414  Carrie Mew, MD 11/24/15 1436

## 2015-11-28 ENCOUNTER — Encounter: Payer: Self-pay | Admitting: Radiology

## 2015-11-28 ENCOUNTER — Emergency Department: Payer: 59

## 2015-11-28 ENCOUNTER — Emergency Department
Admission: EM | Admit: 2015-11-28 | Discharge: 2015-11-28 | Disposition: A | Discharge status: Home / Self Care | Payer: 59 | Attending: Emergency Medicine | Admitting: Emergency Medicine

## 2015-11-28 DIAGNOSIS — L0291 Cutaneous abscess, unspecified: Secondary | ICD-10-CM

## 2015-11-28 DIAGNOSIS — Z880 Allergy status to penicillin: Secondary | ICD-10-CM | POA: Insufficient documentation

## 2015-11-28 DIAGNOSIS — Z792 Long term (current) use of antibiotics: Secondary | ICD-10-CM | POA: Diagnosis not present

## 2015-11-28 DIAGNOSIS — L03818 Cellulitis of other sites: Secondary | ICD-10-CM | POA: Diagnosis not present

## 2015-11-28 DIAGNOSIS — F17200 Nicotine dependence, unspecified, uncomplicated: Secondary | ICD-10-CM | POA: Diagnosis not present

## 2015-11-28 DIAGNOSIS — Z79899 Other long term (current) drug therapy: Secondary | ICD-10-CM | POA: Diagnosis not present

## 2015-11-28 DIAGNOSIS — I10 Essential (primary) hypertension: Secondary | ICD-10-CM | POA: Diagnosis not present

## 2015-11-28 DIAGNOSIS — L02212 Cutaneous abscess of back [any part, except buttock]: Secondary | ICD-10-CM | POA: Diagnosis present

## 2015-11-28 HISTORY — DX: Unspecified asthma, uncomplicated: J45.909

## 2015-11-28 LAB — CBC
HCT: 40.5 % (ref 35.0–47.0)
HEMOGLOBIN: 13.4 g/dL (ref 12.0–16.0)
MCH: 26.6 pg (ref 26.0–34.0)
MCHC: 33.1 g/dL (ref 32.0–36.0)
MCV: 80.3 fL (ref 80.0–100.0)
Platelets: 317 10*3/uL (ref 150–440)
RBC: 5.05 MIL/uL (ref 3.80–5.20)
RDW: 15.1 % — ABNORMAL HIGH (ref 11.5–14.5)
WBC: 12.2 10*3/uL — ABNORMAL HIGH (ref 3.6–11.0)

## 2015-11-28 LAB — BASIC METABOLIC PANEL
ANION GAP: 7 (ref 5–15)
BUN: 12 mg/dL (ref 6–20)
CHLORIDE: 109 mmol/L (ref 101–111)
CO2: 23 mmol/L (ref 22–32)
CREATININE: 0.82 mg/dL (ref 0.44–1.00)
Calcium: 8.8 mg/dL — ABNORMAL LOW (ref 8.9–10.3)
GFR calc non Af Amer: >60 mL/min (ref >60)
Glucose, Bld: 89 mg/dL (ref 65–99)
POTASSIUM: 3.9 mmol/L (ref 3.5–5.1)
SODIUM: 139 mmol/L (ref 135–145)

## 2015-11-28 IMAGING — CT CT T SPINE W/ CM
1 series · 12 of 14 positions shown, 15 images · non-contrast
Comparison: None.

CLINICAL DATA: Patient reports abscess in the mid back for
weeks. Patient has previously been seen for same problem here and is
on doxycycline. Patient reports the abscess has rib occurred.

EXAM:
CT THORACIC SPINE WITHOUT CONTRAST
TECHNIQUE: Multidetector CT imaging of the thoracic spine was performed without
intravenous contrast administration. Multiplanar CT image
reconstructions were also generated.

[Series 4: t spine soft · axial · 0.43mm/px · z∈[-926,-606]mm · 12 of 190 slices shown, 15 images]
[im 15/190  soft-tissue]
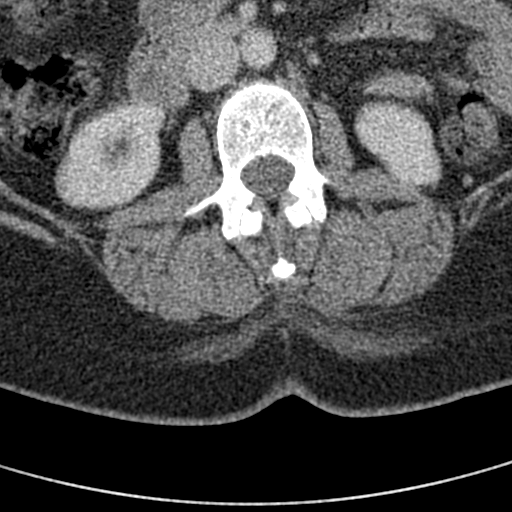
[im 15/190  bone]
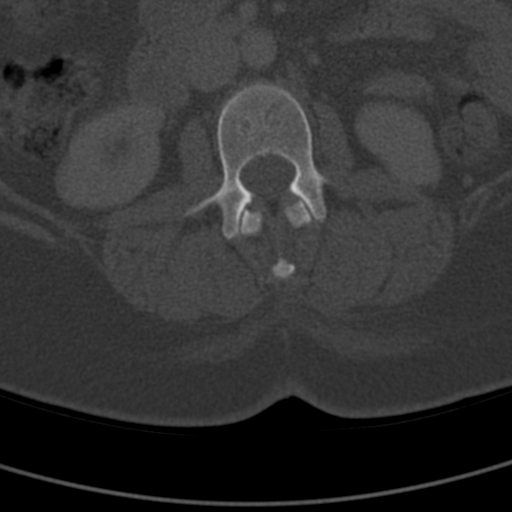
[im 30/190  bone]
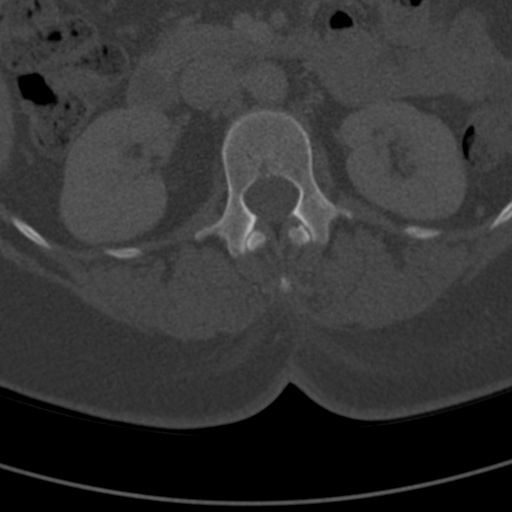
[im 44/190  bone]
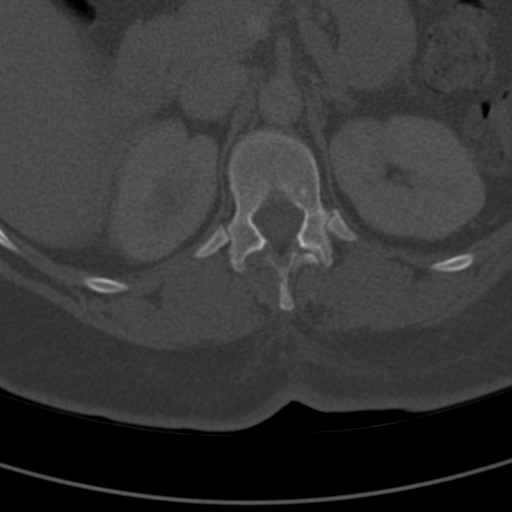
[im 59/190  bone]
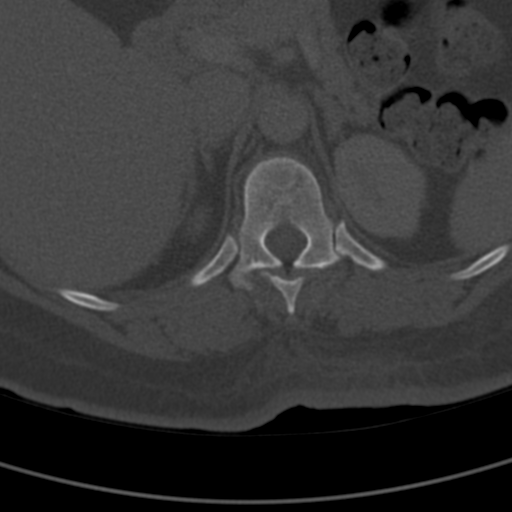
[im 73/190  soft-tissue]
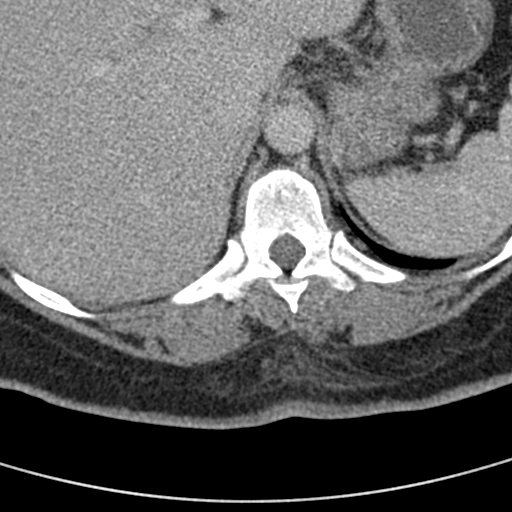
[im 73/190  bone]
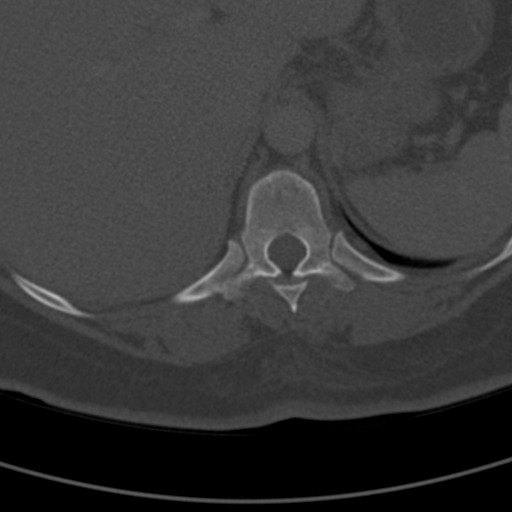
[im 88/190  bone]
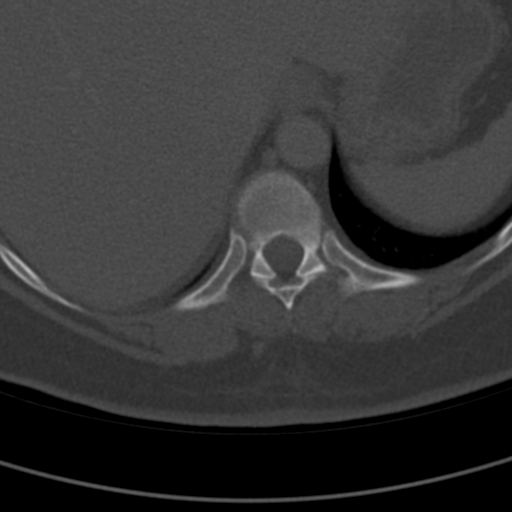
[im 102/190  bone]
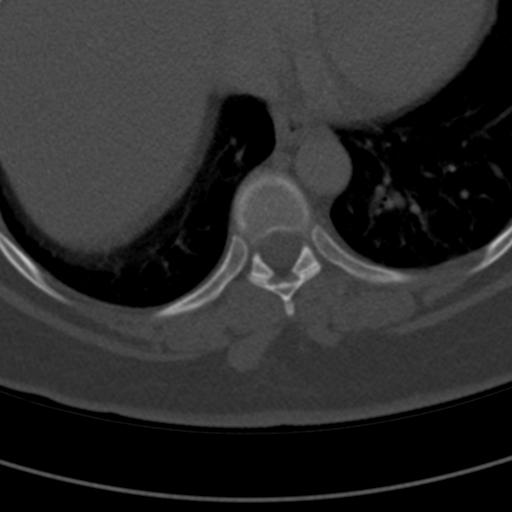
[im 117/190  bone]
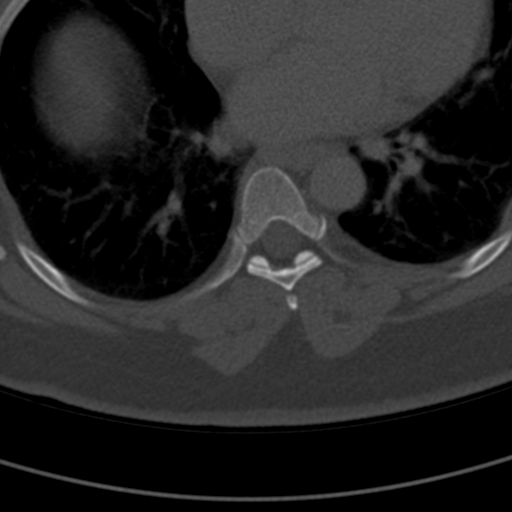
[im 131/190  soft-tissue]
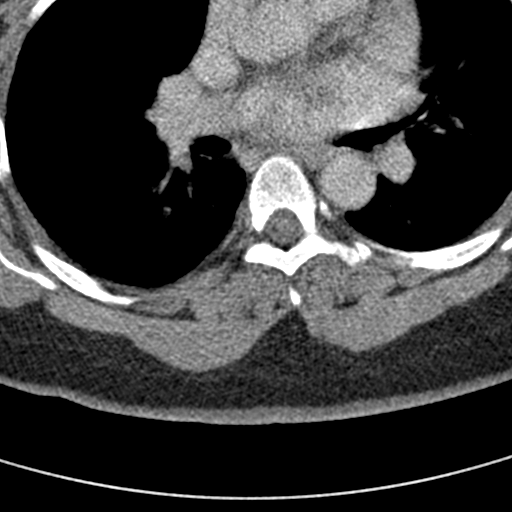
[im 131/190  bone]
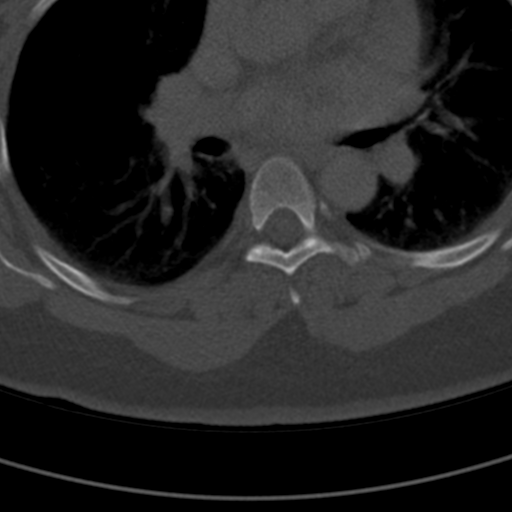
[im 146/190  bone]
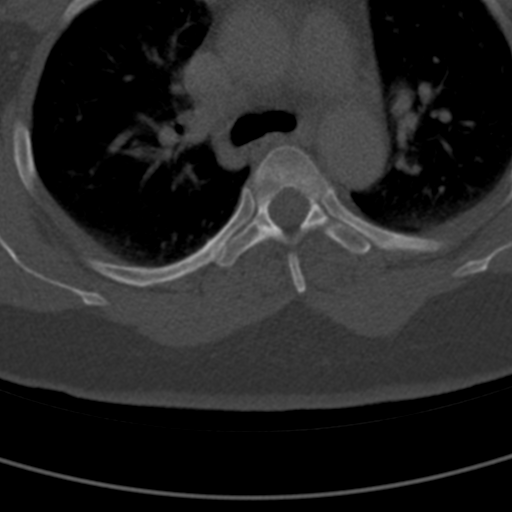
[im 160/190  bone]
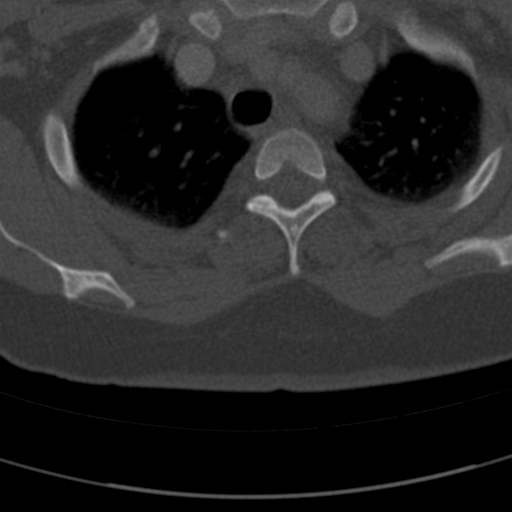
[im 175/190  bone]
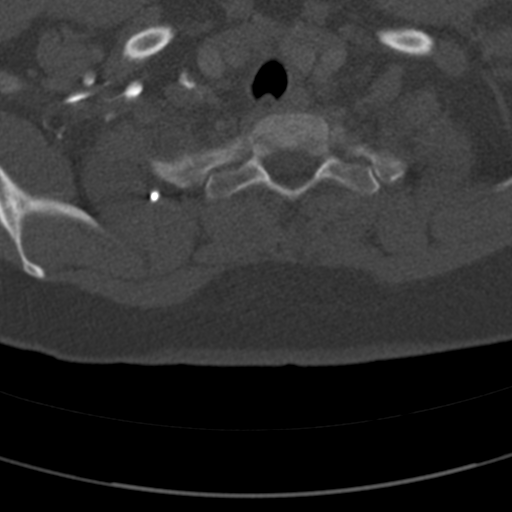

[12 of 14 positions shown; findings below may reference images not displayed]

FINDINGS: There is a focal area of skin thickening and induration in the
subcutaneous fat demonstrated to the left of midline posterior to
the T11 vertebra. This may indicate an area of cellulitis. There is
no discrete fluid collection to suggest an abscess. There is no
abnormal contrast enhancement. Involvement appears to be localized
to the skin and subcutaneous fat. No apparent intramuscular
involvement.

Normal alignment of the thoracic vertebrae. Mild degenerative
changes in the lower thoracic spine with narrowed interspaces and
endplate hypertrophic changes. Visualized central canal appears
intact without bony encroachment. No vertebral compression
deformities. No focal bone destruction or cortical destruction. No
paraspinal soft tissue swelling or infiltration. No abnormal dural
enhancement is identified.

Incidental note of postoperative changes cholecystectomy. Visualized
portions of the lungs are clear.
IMPRESSION: Focal area of skin thickening and infiltration in the subcutaneous
fat demonstrated to the left of midline at the level of T11
posteriorly. This suggests an area of cellulitis. No discrete fluid
collection to suggest an abscess.

## 2015-11-28 MED ORDER — LIDOCAINE-EPINEPHRINE 1 %-1:100000 IJ SOLN
1.3000 mL | Freq: Once | INTRAMUSCULAR | Status: DC
  Filled 2015-11-28: qty 1.3

## 2015-11-28 MED ORDER — HYDROCODONE-ACETAMINOPHEN 5-325 MG PO TABS: 1.0000 | ORAL_TABLET | Freq: Four times a day (QID) | ORAL | Status: DC | PRN

## 2015-11-28 MED ORDER — DIPHENHYDRAMINE HCL 50 MG/ML IJ SOLN
INTRAMUSCULAR | Status: AC
  Filled 2015-11-28: qty 1

## 2015-11-28 MED ORDER — CLINDAMYCIN PHOSPHATE 600 MG/50ML IV SOLN
600.0000 mg | Freq: Once | INTRAVENOUS | Status: AC
  Administered 2015-11-28: 600 mg via INTRAVENOUS
  Filled 2015-11-28: qty 50

## 2015-11-28 MED ORDER — CLINDAMYCIN PHOSPHATE 900 MG/6ML IJ SOLN
600.0000 mg | Freq: Once | INTRAMUSCULAR | Status: AC
  Administered 2015-11-28: 600 mg via INTRAMUSCULAR
  Filled 2015-11-28: qty 6

## 2015-11-28 MED ORDER — DIPHENHYDRAMINE HCL 50 MG/ML IJ SOLN
25.0000 mg | Freq: Once | INTRAMUSCULAR | Status: AC
  Administered 2015-11-28: 25 mg via INTRAVENOUS

## 2015-11-28 MED ORDER — LIDOCAINE-EPINEPHRINE (PF) 1 %-1:200000 IJ SOLN
INTRAMUSCULAR | Status: AC
  Administered 2015-11-28: 30 mL
  Filled 2015-11-28: qty 30

## 2015-11-28 MED ORDER — IOHEXOL 350 MG/ML SOLN
100.0000 mL | Freq: Once | INTRAVENOUS | Status: AC | PRN
  Administered 2015-11-28: 100 mL via INTRAVENOUS
  Filled 2015-11-28: qty 100

## 2015-11-28 MED ORDER — MORPHINE SULFATE (PF) 2 MG/ML IV SOLN
2.0000 mg | Freq: Once | INTRAVENOUS | Status: AC
  Administered 2015-11-28: 2 mg via INTRAVENOUS
  Filled 2015-11-28: qty 1

## 2015-11-28 NOTE — ED Notes (Signed)
Pt reports abscess on her back. Pt seen for same here and on doxycycline. Pt reports it has came back.

## 2015-11-28 NOTE — Discharge Instructions (Signed)
Abscess °An abscess is an infected area that contains a collection of pus and debris. It can occur in almost any part of the body. An abscess is also known as a furuncle or boil. °CAUSES  °An abscess occurs when tissue gets infected. This can occur from blockage of oil or sweat glands, infection of hair follicles, or a minor injury to the skin. As the body tries to fight the infection, pus collects in the area and creates pressure under the skin. This pressure causes pain. People with weakened immune systems have difficulty fighting infections and get certain abscesses more often.  °SYMPTOMS °Usually an abscess develops on the skin and becomes a painful mass that is red, warm, and tender. If the abscess forms under the skin, you may feel a moveable soft area under the skin. Some abscesses break open (rupture) on their own, but most will continue to get worse without care. The infection can spread deeper into the body and eventually into the bloodstream, causing you to feel ill.  °DIAGNOSIS  °Your caregiver will take your medical history and perform a physical exam. A sample of fluid may also be taken from the abscess to determine what is causing your infection. °TREATMENT  °Your caregiver may prescribe antibiotic medicines to fight the infection. However, taking antibiotics alone usually does not cure an abscess. Your caregiver may need to make a small cut (incision) in the abscess to drain the pus. In some cases, gauze is packed into the abscess to reduce pain and to continue draining the area. °HOME CARE INSTRUCTIONS  °· Only take over-the-counter or prescription medicines for pain, discomfort, or fever as directed by your caregiver. °· If you were prescribed antibiotics, take them as directed. Finish them even if you start to feel better. °· If gauze is used, follow your caregiver's directions for changing the gauze. °· To avoid spreading the infection: °· Keep your draining abscess covered with a  bandage. °· Wash your hands well. °· Do not share personal care items, towels, or whirlpools with others. °· Avoid skin contact with others. °· Keep your skin and clothes clean around the abscess. °· Keep all follow-up appointments as directed by your caregiver. °SEEK MEDICAL CARE IF:  °· You have increased pain, swelling, redness, fluid drainage, or bleeding. °· You have muscle aches, chills, or a general ill feeling. °· You have a fever. °MAKE SURE YOU:  °· Understand these instructions. °· Will watch your condition. °· Will get help right away if you are not doing well or get worse. °  °This information is not intended to replace advice given to you by your health care provider. Make sure you discuss any questions you have with your health care provider. °  °Document Released: 07/28/2005 Document Revised: 04/18/2012 Document Reviewed: 12/31/2011 °Elsevier Interactive Patient Education ©2016 [employer] ° °Cellulitis °Cellulitis is an infection of the skin and the tissue beneath it. The infected area is usually red and tender. Cellulitis occurs most often in the arms and lower legs.  °CAUSES  °Cellulitis is caused by bacteria that enter the skin through cracks or cuts in the skin. The most common types of bacteria that cause cellulitis are staphylococci and streptococci. °SIGNS AND SYMPTOMS  °· Redness and warmth. °· Swelling. °· Tenderness or pain. °· Fever. °DIAGNOSIS  °Your health care provider can usually determine what is wrong based on a physical exam. Blood tests may also be done. °TREATMENT  °Treatment usually involves taking an antibiotic medicine. °HOME CARE INSTRUCTIONS  °·   Take your antibiotic medicine as directed by your health care provider. Finish the antibiotic even if you start to feel better. °· Keep the infected arm or leg elevated to reduce swelling. °· Apply a warm cloth to the affected area up to 4 times per day to relieve pain. °· Take medicines only as directed by your health care  provider. °· Keep all follow-up visits as directed by your health care provider. °SEEK MEDICAL CARE IF:  °· You notice red streaks coming from the infected area. °· Your red area gets larger or turns dark in color. °· Your bone or joint underneath the infected area becomes painful after the skin has healed. °· Your infection returns in the same area or another area. °· You notice a swollen bump in the infected area. °· You develop new symptoms. °· You have a fever. °SEEK IMMEDIATE MEDICAL CARE IF:  °· You feel very sleepy. °· You develop vomiting or diarrhea. °· You have a general ill feeling (malaise) with muscle aches and pains. °  °This information is not intended to replace advice given to you by your health care provider. Make sure you discuss any questions you have with your health care provider. °  °Document Released: 07/28/2005 Document Revised: 07/09/2015 Document Reviewed: 01/03/2012 °Elsevier Interactive Patient Education ©2016 [employer] ° °Incision and Drainage °Incision and drainage is a procedure in which a sac-like structure (cystic structure) is opened and drained. The area to be drained usually contains material such as pus, fluid, or blood.  °LET YOUR CAREGIVER KNOW ABOUT:  °· Allergies to medicine. °· Medicines taken, including vitamins, herbs, eyedrops, over-the-counter medicines, and creams. °· Use of steroids (by mouth or creams). °· Previous problems with anesthetics or numbing medicines. °· History of bleeding problems or blood clots. °· Previous surgery. °· Other health problems, including diabetes and kidney problems. °· Possibility of pregnancy, if this applies. °RISKS AND COMPLICATIONS °· Pain. °· Bleeding. °· Scarring. °· Infection. °BEFORE THE PROCEDURE  °You may need to have an ultrasound or other imaging tests to see how large or deep your cystic structure is. Blood tests may also be used to determine if you have an infection or how severe the infection is. You may need to have a  tetanus shot. °PROCEDURE  °The affected area is cleaned with a cleaning fluid. The cyst area will then be numbed with a medicine (local anesthetic). A small incision will be made in the cystic structure. A syringe or catheter may be used to drain the contents of the cystic structure, or the contents may be squeezed out. The area will then be flushed with a cleansing solution. After cleansing the area, it is often gently packed with a gauze or another wound dressing. Once it is packed, it will be covered with gauze and tape or some other type of wound dressing.  °AFTER THE PROCEDURE  °· Often, you will be allowed to go home right after the procedure. °· You may be given antibiotic medicine to prevent or heal an infection. °· If the area was packed with gauze or some other wound dressing, you will likely need to come back in 1 to 2 days to get it removed. °· The area should heal in about 14 days. °  °This information is not intended to replace advice given to you by your health care provider. Make sure you discuss any questions you have with your health care provider. °  °Document Released: 04/13/2001 Document Revised: 04/18/2012 Document Reviewed: 12/13/2011 °  Elsevier Interactive Patient Education ©2016 [employer] Inc. ° °

## 2015-11-28 NOTE — ED Provider Notes (Signed)
CSN: [id number]     Arrival date & time 11/28/15  1614 History   First MD Initiated Contact with Patient 11/28/15 1701     Chief Complaint  Patient presents with  . Abscess     (Consider location/radiation/quality/duration/timing/severity/associated sxs/prior Treatment) HPI  55 year old female presents to the emergency department for evaluation of back pain. Back pain developed 9 days ago, she felt a bump on the left side of her thoracic spine. She came to the emergency department 4 days ago was diagnosed with abscess that was non-fluctuant and indurated, started on doxycycline. Patient saw improvement over the last few days, today developed drainage. Pain is currently 0 out of 10. She denies any fevers, numbness tingling or radicular symptoms. No weakness or loss of bowel or bladder symptoms.  Past Medical History  Diagnosis Date  . Hypertension   . Hyperlipidemia   . Asthma    Past Surgical History  Procedure Laterality Date  . Rotator cuff repair    . Cholecystectomy    . Tubal ligation     No family history on file. Social History  Substance Use Topics  . Smoking status: Current Some Day Smoker  . Smokeless tobacco: Former Systems developer  . Alcohol Use: Yes     Comment: occasional   OB History    No data available     Review of Systems  Constitutional: Negative for fever, chills, activity change and fatigue.  HENT: Negative for congestion, sinus pressure and sore throat.   Eyes: Negative for visual disturbance.  Respiratory: Negative for cough, chest tightness and shortness of breath.   Cardiovascular: Negative for chest pain and leg swelling.  Gastrointestinal: Negative for nausea, vomiting, abdominal pain and diarrhea.  Genitourinary: Negative for dysuria.  Musculoskeletal: Negative for arthralgias and gait problem.  Skin: Positive for wound. Negative for rash.  Neurological: Negative for weakness, numbness and headaches.  Hematological: Negative for adenopathy.   Psychiatric/Behavioral: Negative for behavioral problems, confusion and agitation.      Allergies  Bactrim and Penicillins  Home Medications   Prior to Admission medications   Medication Sig Start Date End Date Taking? Authorizing Provider  cetirizine (ZYRTEC) 10 MG tablet Take 1 tablet (10 mg total) by mouth daily. 10/07/15   Charline Bills Cuthriell, PA-C  doxycycline (VIBRAMYCIN) 100 MG capsule Take 1 capsule (100 mg total) by mouth 2 (two) times daily. 11/24/15   Victorino Dike, FNP  fluticasone (FLONASE) 50 MCG/ACT nasal spray Place 1 spray into both nostrils 2 (two) times daily. 10/07/15   Charline Bills Cuthriell, PA-C  HYDROcodone-acetaminophen (NORCO) 5-325 MG tablet Take 1 tablet by mouth every 6 (six) hours as needed for moderate pain. 11/28/15   Duanne Guess, PA-C  HYDROcodone-acetaminophen (NORCO/VICODIN) 5-325 MG tablet Take 1 tablet by mouth every 4 (four) hours as needed for moderate pain. 11/24/15   Victorino Dike, FNP  ibuprofen (ADVIL,MOTRIN) 800 MG tablet Take 1 tablet (800 mg total) by mouth every 8 (eight) hours as needed. 11/24/15   Victorino Dike, FNP  mupirocin ointment (BACTROBAN) 2 % Apply to affected area 3 times daily 11/24/15 11/23/16  Cari B Triplett, FNP   BP 127/76 mmHg  Pulse 63  Temp(Src) 98 F (36.7 C) (Oral)  Resp 18  Ht 5\' 9"  (1.753 m)  Wt 146.965 kg  BMI 47.82 kg/m2  SpO2 100% Physical Exam  Constitutional: She is oriented to person, place, and time. She appears well-developed and well-nourished. No distress.  HENT:  Head: Normocephalic and atraumatic.  Mouth/Throat: Oropharynx is clear and moist.  Eyes: EOM are normal. Pupils are equal, round, and reactive to light. Right eye exhibits no discharge. Left eye exhibits no discharge.  Neck: Normal range of motion. Neck supple.  Cardiovascular: Normal rate, regular rhythm and intact distal pulses.   Pulmonary/Chest: Effort normal and breath sounds normal. No respiratory distress. She exhibits no  tenderness.  Abdominal: Soft. She exhibits no distension. There is no tenderness.  Musculoskeletal:  Examination of the thoracic spine shows patient has no spinous process tenderness. Incision is intact with no signs of infection, warmth erythema or drainage. To the left of the incision there is a 6 cm in diameter area of induration with minimal drainage present. There is no fluctuance. No increase in drainage with compression. Mild warmth with erythema is present. Indurated area is tender to palpation. She is nontender over the spinous process she has full range of motion of the thoracic and lumbar spine with no discomfort. Examination of bilateral lower extremity shows patient has full range of motion of the hips knees and ankles with no discomfort.  Neurological: She is alert and oriented to person, place, and time. She has normal reflexes.  Skin: Skin is warm and dry.  Psychiatric: She has a normal mood and affect. Her behavior is normal. Thought content normal.    ED Course  Procedures (including critical care time) INCISION AND DRAINAGE Performed by: Feliberto Gottron Consent: Verbal consent obtained. Risks and benefits: risks, benefits and alternatives were discussed Type: abscess  Body area: Thoracic spine  Anesthesia: local infiltration  Incision was made with a scalpel.  Local anesthetic: lidocaine 1% % with epinephrine  Anesthetic total: 2 ml  Complexity: complex Blunt dissection to break up loculations  Drainage: purulent  Drainage amount: 2 cc   Packing material: 1/4 in iodoform gauze  Patient tolerance: Patient tolerated the procedure well with no immediate complications.     Labs Review Labs Reviewed  CBC - Abnormal; Notable for the following:    WBC 12.2 (*)    RDW 15.1 (*)    All other components within normal limits  BASIC METABOLIC PANEL - Abnormal; Notable for the following:    Calcium 8.8 (*)    All other components within normal limits   WOUND CULTURE    Imaging Review Ct Thoracic Spine W Contrast  11/28/2015  CLINICAL DATA:  Patient reports abscess in the mid back for 1.5 weeks. Patient has previously been seen for same problem here and is on doxycycline. Patient reports the abscess has rib occurred. EXAM: CT THORACIC SPINE WITHOUT CONTRAST TECHNIQUE: Multidetector CT imaging of the thoracic spine was performed without intravenous contrast administration. Multiplanar CT image reconstructions were also generated. COMPARISON:  None. FINDINGS: There is a focal area of skin thickening and induration in the subcutaneous fat demonstrated to the left of midline posterior to the T11 vertebra. This may indicate an area of cellulitis. There is no discrete fluid collection to suggest an abscess. There is no abnormal contrast enhancement. Involvement appears to be localized to the skin and subcutaneous fat. No apparent intramuscular involvement. Normal alignment of the thoracic vertebrae. Mild degenerative changes in the lower thoracic spine with narrowed interspaces and endplate hypertrophic changes. Visualized central canal appears intact without bony encroachment. No vertebral compression deformities. No focal bone destruction or cortical destruction. No paraspinal soft tissue swelling or infiltration. No abnormal dural enhancement is identified. Incidental note of postoperative changes cholecystectomy. Visualized portions of the lungs are clear. IMPRESSION: Focal  area of skin thickening and infiltration in the subcutaneous fat demonstrated to the left of midline at the level of T11 posteriorly. This suggests an area of cellulitis. No discrete fluid collection to suggest an abscess. Electronically Signed   By: Lucienne Capers M.D.   On: 11/28/2015 19:27   I have personally reviewed and evaluated these images and lab results as part of my medical decision-making.   EKG Interpretation None      MDM   Final diagnoses:  Abscess thoracic  spine  Cellulitis of other specified site    55 year old female presents with cellulitis and indurated area to the left thoracic spine along T11. Patient seen 4 days ago, treated with doxycycline, saw some improvement but noticed drainage today. She did see additional improvement after drainage. She was concerned about drainage so comes into the ED today. Vital signs are stable, afebrile. White count slightly elevated at 12.2. CT with contrast of the thoracic spine showed evidence of cellulitis and no focal area of abscess formation. Due to significant drainage earlier today, incision and drainage was made. Minimal amount of drainage was collected and sent for culture. Wound was packed with iodoform. She will follow-up in 2-3 days for a wound check. She is given 1 dose of IV clindamycin 600 mg. Continue doxycycline by mouth twice a day.     Duanne Guess, PA-C 11/28/15 2040  Ahmed Prima, MD 11/29/15 [phone]

## 2015-11-28 NOTE — ED Notes (Signed)
Reviewed d/c instructions, follow-up care and prescriptions with pt. Pt verbalized understanding 

## 2015-12-01 ENCOUNTER — Encounter: Payer: Self-pay | Admitting: Emergency Medicine

## 2015-12-01 ENCOUNTER — Emergency Department
Admission: EM | Admit: 2015-12-01 | Discharge: 2015-12-01 | Disposition: A | Discharge status: Home / Self Care | Payer: 59 | Attending: Emergency Medicine | Admitting: Emergency Medicine

## 2015-12-01 DIAGNOSIS — I10 Essential (primary) hypertension: Secondary | ICD-10-CM | POA: Diagnosis not present

## 2015-12-01 DIAGNOSIS — IMO0001 Reserved for inherently not codable concepts without codable children: Secondary | ICD-10-CM

## 2015-12-01 DIAGNOSIS — R21 Rash and other nonspecific skin eruption: Secondary | ICD-10-CM | POA: Diagnosis not present

## 2015-12-01 DIAGNOSIS — Z79899 Other long term (current) drug therapy: Secondary | ICD-10-CM | POA: Diagnosis not present

## 2015-12-01 DIAGNOSIS — Z792 Long term (current) use of antibiotics: Secondary | ICD-10-CM | POA: Insufficient documentation

## 2015-12-01 DIAGNOSIS — Z880 Allergy status to penicillin: Secondary | ICD-10-CM | POA: Diagnosis not present

## 2015-12-01 DIAGNOSIS — L02212 Cutaneous abscess of back [any part, except buttock]: Secondary | ICD-10-CM | POA: Diagnosis not present

## 2015-12-01 DIAGNOSIS — F17200 Nicotine dependence, unspecified, uncomplicated: Secondary | ICD-10-CM | POA: Insufficient documentation

## 2015-12-01 DIAGNOSIS — T814XXA Infection following a procedure, initial encounter: Secondary | ICD-10-CM

## 2015-12-01 DIAGNOSIS — Z4801 Encounter for change or removal of surgical wound dressing: Secondary | ICD-10-CM | POA: Diagnosis present

## 2015-12-01 LAB — WOUND CULTURE

## 2015-12-01 NOTE — ED Notes (Signed)
Packing intact to area on back   No increased swelling or drainage

## 2015-12-01 NOTE — ED Notes (Signed)
Here to have packing removed from cyst on back   Also developed a rash d/t antibiotics

## 2015-12-01 NOTE — Discharge Instructions (Signed)
Discontinue taking doxycycline and begin taking Benadryl  1 or 2 every 6 hours as needed for itching. Change dressing twice a day. Follow-up with [facility] clinic if any continued problems.

## 2015-12-01 NOTE — ED Provider Notes (Signed)
[facility] Emergency Department Provider Note  ____________________________________________  Time seen: Approximately 8:31 AM  I have reviewed the triage vital signs and the nursing notes.   HISTORY  Chief Complaint Wound Check   HPI Shannon Bryan is a 55 y.o. female is here to have packing removed from her back. Patient was here 2 days ago where she had an abscess that was I&D. Patient states that she developed a rash at the same time and is unsure whether it was the injection that she got or the oral medication. Patient states that she is relatively sure that it was the injections due to the fact she is taking oral medication before. She denies any fever or continued back pain.Patient does have a rash mostly on the trunk but upper extremities are involved as well. Patient is been taking Benadryl with some relief of the itching. She denies any difficulty swallowing, breathing or talking. Currently she is not having any pain.   Past Medical History  Diagnosis Date  . Hypertension   . Hyperlipidemia   . Asthma     There are no active problems to display for this patient.   Past Surgical History  Procedure Laterality Date  . Rotator cuff repair    . Cholecystectomy    . Tubal ligation      Current Outpatient Rx  Name  Route  Sig  Dispense  Refill  . cetirizine (ZYRTEC) 10 MG tablet   Oral   Take 1 tablet (10 mg total) by mouth daily.   30 tablet   0   . doxycycline (VIBRAMYCIN) 100 MG capsule   Oral   Take 1 capsule (100 mg total) by mouth 2 (two) times daily.   20 capsule   0   . fluticasone (FLONASE) 50 MCG/ACT nasal spray   Each Nare   Place 1 spray into both nostrils 2 (two) times daily.   16 g   0   . HYDROcodone-acetaminophen (NORCO) 5-325 MG tablet   Oral   Take 1 tablet by mouth every 6 (six) hours as needed for moderate pain.   15 tablet   0   . HYDROcodone-acetaminophen (NORCO/VICODIN) 5-325 MG tablet   Oral   Take 1  tablet by mouth every 4 (four) hours as needed for moderate pain.   20 tablet   0   . ibuprofen (ADVIL,MOTRIN) 800 MG tablet   Oral   Take 1 tablet (800 mg total) by mouth every 8 (eight) hours as needed.   30 tablet   0   . mupirocin ointment (BACTROBAN) 2 %      Apply to affected area 3 times daily   22 g   0     Allergies Bactrim; Penicillins; and Cleocin  No family history on file.  Social History Social History  Substance Use Topics  . Smoking status: Current Some Day Smoker  . Smokeless tobacco: Former Systems developer  . Alcohol Use: Yes     Comment: occasional    Review of Systems Constitutional: No fever/chills ENT: No sore throat. Cardiovascular: Denies chest pain. Respiratory: Denies shortness of breath. Gastrointestinal: No abdominal pain.  No nausea, no vomiting.   Musculoskeletal: Negative for back pain. Skin: Positive for abscess. Positive for rash. Neurological: Negative for headaches, focal weakness or numbness.  10-point ROS otherwise negative.  ____________________________________________   PHYSICAL EXAM:  VITAL SIGNS: ED Triage Vitals  Enc Vitals Group     BP --      Pulse --  Resp --      Temp --      Temp src --      SpO2 --      Weight --      Height --      Head Cir --      Peak Flow --      Pain Score --      Pain Loc --      Pain Edu? --      Excl. in [place]? --     Constitutional: Alert and oriented. Well appearing and in no acute distress. Eyes: Conjunctivae are normal. PERRL. EOMI. Head: Atraumatic. Nose: No congestion/rhinnorhea. Neck: No stridor.   Respiratory: Normal respiratory effort.   Gastrointestinal: Soft and nontender. No distention.  Musculoskeletal: No lower extremity tenderness nor edema.  No joint effusions. Neurologic:  Normal speech and language. No gross focal neurologic deficits are appreciated. No gait instability. Skin:  Skin is warm, dry and intact. Resolving abscess left lower back. Patient has   erythematous rash diffusely over the torso and upper extremities. This is suggestive and questionable for a drug reaction. Psychiatric: Mood and affect are normal. Speech and behavior are normal.  ____________________________________________   LABS (all labs ordered are listed, but only abnormal results are displayed)  Labs Reviewed - No data to display  PROCEDURES  Procedure(s) performed: None  Critical Care performed: No  ____________________________________________   INITIAL IMPRESSION / ASSESSMENT AND PLAN / ED COURSE  Pertinent labs & imaging results that were available during my care of the patient were reviewed by me and considered in my medical decision making (see chart for details).   ____________________________________________   FINAL CLINICAL IMPRESSION(S) / ED DIAGNOSES  Final diagnoses:  Wound abscess, initial encounter  Rash/skin eruption      Johnn Hai, PA-C 12/01/15 [place], MD 12/01/15 [phone]

## 2017-01-14 ENCOUNTER — Emergency Department
Admission: EM | Admit: 2017-01-14 | Discharge: 2017-01-14 | Disposition: A | Discharge status: Home / Self Care | Payer: 59 | Attending: Emergency Medicine | Admitting: Emergency Medicine

## 2017-01-14 ENCOUNTER — Encounter: Payer: Self-pay | Admitting: Emergency Medicine

## 2017-01-14 DIAGNOSIS — F17200 Nicotine dependence, unspecified, uncomplicated: Secondary | ICD-10-CM | POA: Insufficient documentation

## 2017-01-14 DIAGNOSIS — J301 Allergic rhinitis due to pollen: Secondary | ICD-10-CM | POA: Insufficient documentation

## 2017-01-14 DIAGNOSIS — Z79899 Other long term (current) drug therapy: Secondary | ICD-10-CM | POA: Insufficient documentation

## 2017-01-14 DIAGNOSIS — I10 Essential (primary) hypertension: Secondary | ICD-10-CM | POA: Insufficient documentation

## 2017-01-14 MED ORDER — FEXOFENADINE-PSEUDOEPHED ER 60-120 MG PO TB12: 1.0000 | ORAL_TABLET | Freq: Two times a day (BID) | ORAL | 0 refills | Status: DC

## 2017-01-14 MED ORDER — METHYLPREDNISOLONE 4 MG PO TBPK: ORAL_TABLET | ORAL | 0 refills | Status: DC

## 2017-01-14 MED ORDER — TRAMADOL HCL 50 MG PO TABS: 50.0000 mg | ORAL_TABLET | Freq: Four times a day (QID) | ORAL | 0 refills | Status: DC | PRN

## 2017-01-14 NOTE — ED Provider Notes (Signed)
[facility] Emergency Department Provider Note   ____________________________________________   None    (approximate)  I have reviewed the triage vital signs and the nursing notes.   HISTORY  Chief Complaint Sore Throat    HPI Shannon Bryan is a 56 y.o. female patient complain of nasal congestion, frontal headache, facial pain, and sore throat. Patient stated onset 2 days. Patient also complaining of bilateral ear pressure and watery eyes. Patient state increased windy conditions is caused a flareup of her allergic rhinitis. Patient also has a nonproductive cough with this complaint. Patient rates the pain as a 4/10. Patient describes her pain as "pressure/aching". No palliative measures for her complaint.   Past Medical History:  Diagnosis Date  . Asthma   . Hyperlipidemia   . Hypertension     There are no active problems to display for this patient.   Past Surgical History:  Procedure Laterality Date  . CHOLECYSTECTOMY    . ROTATOR CUFF REPAIR    . TUBAL LIGATION      Prior to Admission medications   Medication Sig Start Date End Date Taking? Authorizing Provider  cetirizine (ZYRTEC) 10 MG tablet Take 1 tablet (10 mg total) by mouth daily. 10/07/15   Charline Bills Cuthriell, PA-C  doxycycline (VIBRAMYCIN) 100 MG capsule Take 1 capsule (100 mg total) by mouth 2 (two) times daily. 11/24/15   Cari B Triplett, FNP  fexofenadine-pseudoephedrine (ALLEGRA-D) 60-120 MG 12 hr tablet Take 1 tablet by mouth 2 (two) times daily. 01/14/17   Sable Feil, PA-C  fluticasone (FLONASE) 50 MCG/ACT nasal spray Place 1 spray into both nostrils 2 (two) times daily. 10/07/15   Charline Bills Cuthriell, PA-C  HYDROcodone-acetaminophen (NORCO) 5-325 MG tablet Take 1 tablet by mouth every 6 (six) hours as needed for moderate pain. 11/28/15   Duanne Guess, PA-C  HYDROcodone-acetaminophen (NORCO/VICODIN) 5-325 MG tablet Take 1 tablet by mouth every 4 (four) hours as needed  for moderate pain. 11/24/15   Victorino Dike, FNP  ibuprofen (ADVIL,MOTRIN) 800 MG tablet Take 1 tablet (800 mg total) by mouth every 8 (eight) hours as needed. 11/24/15   Victorino Dike, FNP  methylPREDNISolone (MEDROL DOSEPAK) 4 MG TBPK tablet Take Tapered dose as directed 01/14/17   Sable Feil, PA-C  traMADol (ULTRAM) 50 MG tablet Take 1 tablet (50 mg total) by mouth every 6 (six) hours as needed for moderate pain. 01/14/17   Sable Feil, PA-C    Allergies Bactrim [sulfamethoxazole-trimethoprim]; Penicillins; and Cleocin [clindamycin hcl]  No family history on file.  Social History Social History  Substance Use Topics  . Smoking status: Current Some Day Smoker  . Smokeless tobacco: Former Systems developer  . Alcohol use Yes     Comment: occasional    Review of Systems Constitutional: No fever/chills Eyes: No visual changes. ENT: No sore throat. Cardiovascular: Denies chest pain. Respiratory: Denies shortness of breath. Gastrointestinal: No abdominal pain.  No nausea, no vomiting.  No diarrhea.  No constipation. Genitourinary: Negative for dysuria. Musculoskeletal: Negative for back pain. Skin: Negative for rash. Neurological: Negative for headaches, focal weakness or numbness. Endocrine:Hypertension hyperlipidemia. Allergic/Immunilogical: See medication list   ____________________________________________   PHYSICAL EXAM:  VITAL SIGNS: ED Triage Vitals [01/14/17 1350]  Enc Vitals Group     BP (!) 156/85     Pulse Rate 72     Resp 17     Temp 98.6 F (37 C)     Temp Source Oral  SpO2 96 %     Weight (!) 325 lb (147.4 kg)     Height 5\' 9"  (1.753 m)     Head Circumference      Peak Flow      Pain Score 4     Pain Loc      Pain Edu?      Excl. in [place]?     Constitutional: Alert and oriented. Well appearing and in no acute distress.Morbid obesity Eyes: Conjunctivae are normal. PERRL. EOMI. Head: Atraumatic. Nose: Bilaterally frontal maxillary sinus guarding.  Edematous bilateral nasal turbinates with clear rhinorrhea. Mouth/Throat: Mucous membranes are moist.  Oropharynx non-erythematous. Postnasal drainage Neck: No stridor.  No cervical spine tenderness to palpation. Cardiovascular: Normal rate, regular rhythm. Grossly normal heart sounds.  Good peripheral circulation. Respiratory: Normal respiratory effort.  No retractions. Lungs CTAB. Gastrointestinal: Soft and nontender. No distention. No abdominal bruits. No CVA tenderness. Musculoskeletal: No lower extremity tenderness nor edema.  No joint effusions. Neurologic:  Normal speech and language. No gross focal neurologic deficits are appreciated. No gait instability. Skin:  Skin is warm, dry and intact. No rash noted. Psychiatric: Mood and affect are normal. Speech and behavior are normal.  ____________________________________________   LABS (all labs ordered are listed, but only abnormal results are displayed)  Labs Reviewed - No data to display ____________________________________________  EKG   ____________________________________________  RADIOLOGY   ____________________________________________   PROCEDURES  Procedure(s) performed: None  Procedures  Critical Care performed: No  ____________________________________________   INITIAL IMPRESSION / ASSESSMENT AND PLAN / ED COURSE  Pertinent labs & imaging results that were available during my care of the patient were reviewed by me and considered in my medical decision making (see chart for details).  Allergic rhinitis. Patient given discharge care instructions. Patient given a work note. Patient given a prescription for Allegra-D, Medrol Dosepak, prescription of tramadol.      ____________________________________________   FINAL CLINICAL IMPRESSION(S) / ED DIAGNOSES  Final diagnoses:  Acute seasonal allergic rhinitis due to pollen      NEW MEDICATIONS STARTED DURING THIS VISIT:  New Prescriptions    FEXOFENADINE-PSEUDOEPHEDRINE (ALLEGRA-D) 60-120 MG 12 HR TABLET    Take 1 tablet by mouth 2 (two) times daily.   METHYLPREDNISOLONE (MEDROL DOSEPAK) 4 MG TBPK TABLET    Take Tapered dose as directed   TRAMADOL (ULTRAM) 50 MG TABLET    Take 1 tablet (50 mg total) by mouth every 6 (six) hours as needed for moderate pain.     Note:  This document was prepared using Dragon voice recognition software and may include unintentional dictation errors.    Sable Feil, PA-C 01/14/17 [place], MD 01/14/17 [phone]

## 2017-01-14 NOTE — ED Triage Notes (Signed)
Patient presents to ED via POV with c/o sore throat and HA that started last night. Tonsils are not swollen at this time. Patient speaking in full, clear sentences.

## 2017-03-25 ENCOUNTER — Ambulatory Visit (INDEPENDENT_AMBULATORY_CARE_PROVIDER_SITE_OTHER): Payer: Self-pay | Admitting: Nurse Practitioner

## 2017-03-25 ENCOUNTER — Encounter: Payer: Self-pay | Admitting: Nurse Practitioner

## 2017-03-25 VITALS — BP 135/86 | HR 66 | Temp 98.3°F | Resp 16 | Ht 69.0 in | Wt 306.5 lb

## 2017-03-25 DIAGNOSIS — R7303 Prediabetes: Secondary | ICD-10-CM | POA: Diagnosis not present

## 2017-03-25 DIAGNOSIS — L89892 Pressure ulcer of other site, stage 2: Secondary | ICD-10-CM

## 2017-03-25 DIAGNOSIS — Z7689 Persons encountering health services in other specified circumstances: Secondary | ICD-10-CM | POA: Diagnosis not present

## 2017-03-25 DIAGNOSIS — R600 Localized edema: Secondary | ICD-10-CM | POA: Diagnosis not present

## 2017-03-25 DIAGNOSIS — I10 Essential (primary) hypertension: Secondary | ICD-10-CM

## 2017-03-25 DIAGNOSIS — M5442 Lumbago with sciatica, left side: Secondary | ICD-10-CM

## 2017-03-25 MED ORDER — "AMD FOAM DRESSING 6""X6"" PADS": 4.0000 | MEDICATED_PAD | Freq: Once | 1 refills | Status: AC

## 2017-03-25 MED ORDER — HYDROCHLOROTHIAZIDE 12.5 MG PO TABS: 12.5000 mg | ORAL_TABLET | Freq: Every day | ORAL | 2 refills | Status: DC

## 2017-03-25 MED ORDER — BACLOFEN 10 MG PO TABS: 10.0000 mg | ORAL_TABLET | Freq: Three times a day (TID) | ORAL | 0 refills | Status: DC

## 2017-03-25 NOTE — Patient Instructions (Addendum)
Shannon Bryan, Thank you for coming in to clinic today.  1.For your BACK PAIN: - Start taking Tylenol extra strength 1 to 2 tablets every 6-8 hours for aches or fever/chills for next few days as needed.  Do not take more than 3,000 mg in 24 hours from all medicines.  May take Ibuprofen as well if tolerated 200-400mg  every 8 hours as needed. - Use heat and ice.  Apply this for 15 minutes at a time 6-8 times per day.   - Muscle rub with lidocaine.  Avoid using this with heat and ice to avoid burns. - Take baclofen 10 mg up to 3 times daily as needed for spasm.  2. For your Blood Pressure: - START hydrochlorothiazide 12.5 mg once daily. - Can continue your vinegar.  - Return for fasting blood work 8a-12 noon. - RETURN for follow up 4 weeks.  Bring log of BP readings, we can make med changes.  3. Shoulder lesions: - Use the foam dressing - keep it applied for about 5-7 days, then replace.  Use until skin has healed.  Please schedule a follow-up appointment with Cassell Smiles, AGNP to Return in about 4 weeks (around 04/22/2017) for Blood pressure.  If you have any other questions or concerns, please feel free to call the clinic or send a message through [facility]. You may also schedule an earlier appointment if necessary.  Cassell Smiles, DNP, AGNP-BC Adult Gerontology Nurse Practitioner [facility], [facility]   DASH Eating Plan DASH stands for "Dietary Approaches to Stop Hypertension." The DASH eating plan is a healthy eating plan that has been shown to reduce high blood pressure (hypertension). It may also reduce your risk for type 2 diabetes, heart disease, and stroke. The DASH eating plan may also help with weight loss. What are tips for following this plan? General guidelines   Avoid eating more than 2,300 mg (milligrams) of salt (sodium) a day. If you have hypertension, you may need to reduce your sodium intake to 1,500 mg a day.  Limit alcohol intake to no more than 1 drink a  day for nonpregnant women and 2 drinks a day for men. One drink equals 12 oz of beer, 5 oz of wine, or 1 oz of hard liquor.  Work with your health care provider to maintain a healthy body weight or to lose weight. Ask what an ideal weight is for you.  Get at least 30 minutes of exercise that causes your heart to beat faster (aerobic exercise) most days of the week. Activities may include walking, swimming, or biking.  Work with your health care provider or diet and nutrition specialist (dietitian) to adjust your eating plan to your individual calorie needs. Reading food labels   Check food labels for the amount of sodium per serving. Choose foods with less than 5 percent of the Daily Value of sodium. Generally, foods with less than 300 mg of sodium per serving fit into this eating plan.  To find whole grains, look for the word "whole" as the first word in the ingredient list. Shopping   Buy products labeled as "low-sodium" or "no salt added."  Buy fresh foods. Avoid canned foods and premade or frozen meals. Cooking   Avoid adding salt when cooking. Use salt-free seasonings or herbs instead of table salt or sea salt. Check with your health care provider or pharmacist before using salt substitutes.  Do not fry foods. Cook foods using healthy methods such as baking, boiling, grilling, and broiling instead.  Cook with heart-healthy oils, such as olive, canola, soybean, or sunflower oil. Meal planning    Eat a balanced diet that includes:  5 or more servings of fruits and vegetables each day. At each meal, try to fill half of your plate with fruits and vegetables.  Up to 6-8 servings of whole grains each day.  Less than 6 oz of lean meat, poultry, or fish each day. A 3-oz serving of meat is about the same size as a deck of cards. One egg equals 1 oz.  2 servings of low-fat dairy each day.  A serving of nuts, seeds, or beans 5 times each week.  Heart-healthy fats. Healthy fats  called Omega-3 fatty acids are found in foods such as flaxseeds and coldwater fish, like sardines, salmon, and mackerel.  Limit how much you eat of the following:  Canned or prepackaged foods.  Food that is high in trans fat, such as fried foods.  Food that is high in saturated fat, such as fatty meat.  Sweets, desserts, sugary drinks, and other foods with added sugar.  Full-fat dairy products.  Do not salt foods before eating.  Try to eat at least 2 vegetarian meals each week.  Eat more home-cooked food and less restaurant, buffet, and fast food.  When eating at a restaurant, ask that your food be prepared with less salt or no salt, if possible. What foods are recommended? The items listed may not be a complete list. Talk with your dietitian about what dietary choices are best for you. Grains  Whole-grain or whole-wheat bread. Whole-grain or whole-wheat pasta. Brown rice. [place]. Bulgur. Whole-grain and low-sodium cereals. Pita bread. Low-fat, low-sodium crackers. Whole-wheat flour tortillas. Vegetables  Fresh or frozen vegetables (raw, steamed, roasted, or grilled). Low-sodium or reduced-sodium tomato and vegetable juice. Low-sodium or reduced-sodium tomato sauce and tomato paste. Low-sodium or reduced-sodium canned vegetables. Fruits  All fresh, dried, or frozen fruit. Canned fruit in natural juice (without added sugar). Meat and other protein foods  Skinless chicken or [place]. Ground chicken or [place]. Pork with fat trimmed off. Fish and seafood. Egg whites. Dried beans, peas, or lentils. Unsalted nuts, nut butters, and seeds. Unsalted canned beans. Lean cuts of beef with fat trimmed off. Low-sodium, lean deli meat. Dairy  Low-fat (1%) or fat-free (skim) milk. Fat-free, low-fat, or reduced-fat cheeses. Nonfat, low-sodium ricotta or cottage cheese. Low-fat or nonfat yogurt. Low-fat, low-sodium cheese. Fats and oils  Soft margarine without trans fats. Vegetable oil.  Low-fat, reduced-fat, or light mayonnaise and salad dressings (reduced-sodium). Canola, safflower, olive, soybean, and sunflower oils. Avocado. Seasoning and other foods  Herbs. Spices. Seasoning mixes without salt. Unsalted popcorn and pretzels. Fat-free sweets. What foods are not recommended? The items listed may not be a complete list. Talk with your dietitian about what dietary choices are best for you. Grains  Baked goods made with fat, such as croissants, muffins, or some breads. Dry pasta or rice meal packs. Vegetables  Creamed or fried vegetables. Vegetables in a cheese sauce. Regular canned vegetables (not low-sodium or reduced-sodium). Regular canned tomato sauce and paste (not low-sodium or reduced-sodium). Regular tomato and vegetable juice (not low-sodium or reduced-sodium). Angie Fava. Olives. Fruits  Canned fruit in a light or heavy syrup. Fried fruit. Fruit in cream or butter sauce. Meat and other protein foods  Fatty cuts of meat. Ribs. Fried meat. Berniece Salines. Sausage. Bologna and other processed lunch meats. Salami. Fatback. Hotdogs. Bratwurst. Salted nuts and seeds. Canned beans with added salt. Canned or smoked  fish. Whole eggs or egg yolks. Chicken or [place] with skin. Dairy  Whole or 2% milk, cream, and half-and-half. Whole or full-fat cream cheese. Whole-fat or sweetened yogurt. Full-fat cheese. Nondairy creamers. Whipped toppings. Processed cheese and cheese spreads. Fats and oils  Butter. Stick margarine. Lard. Shortening. Ghee. Bacon fat. Tropical oils, such as coconut, palm kernel, or palm oil. Seasoning and other foods  Salted popcorn and pretzels. Onion salt, garlic salt, seasoned salt, table salt, and sea salt. Worcestershire sauce. Tartar sauce. Barbecue sauce. Teriyaki sauce. Soy sauce, including reduced-sodium. Steak sauce. Canned and packaged gravies. Fish sauce. Oyster sauce. Cocktail sauce. Horseradish that you find on the shelf. Ketchup. Mustard. Meat flavorings and  tenderizers. Bouillon cubes. Hot sauce and Tabasco sauce. Premade or packaged marinades. Premade or packaged taco seasonings. Relishes. Regular salad dressings. Where to find more information:  National Heart, Lung, and [place]: https://wilson-eaton.com/  American Heart Association: www.heart.org Summary  The DASH eating plan is a healthy eating plan that has been shown to reduce high blood pressure (hypertension). It may also reduce your risk for type 2 diabetes, heart disease, and stroke.  With the DASH eating plan, you should limit salt (sodium) intake to 2,300 mg a day. If you have hypertension, you may need to reduce your sodium intake to 1,500 mg a day.  When on the DASH eating plan, aim to eat more fresh fruits and vegetables, whole grains, lean proteins, low-fat dairy, and heart-healthy fats.  Work with your health care provider or diet and nutrition specialist (dietitian) to adjust your eating plan to your individual calorie needs. This information is not intended to replace advice given to you by your health care provider. Make sure you discuss any questions you have with your health care provider. Document Released: 10/07/2011 Document Revised: 10/11/2016 Document Reviewed: 10/11/2016 Elsevier Interactive Patient Education  2017 [employer].

## 2017-03-25 NOTE — Progress Notes (Signed)
Subjective:    Patient ID: Shannon Bryan, female    DOB: 1961/06/16, 56 y.o.   MRN: [id number]  Shannon Bryan is a 56 y.o. female presenting on 03/25/2017 for New Patient (Initial Visit) (Patient here today to establish care. Patient moved from [place]. Patient c/o lower back pain and needing BP medication. Patient reports she D/C lisinopril several months ago. Patient reports that she has been having worsening swelling on feet, pt took furosemide.)   HPI  Establish Care Last at a PCP 3-4 years ago when she lived in [place].  Pt is an LPN.  Hypertension Had taken Lisinopril - HCTZ 20-12.5 mg once daily and stopped taking last year.   Had been on it for years.  Has taken lasix 40 mg of someone else.  Has peed all day and had bad muscle cramps, so she took 40 meQ Kcl replacement.  Started changing diet and adding vinegar and states she has had "good BP readings" off medicine, but does not have exact numbers.  Stopped taking lisinopril b/c her Mother started fussing about lisinopril r/t side effect of angioedema.  Pt never experienced any side effects of lisinopril.  Pt endorses palpitations, dizziness (ear infection several months ago with severe infection), leg swelling. Pt denies headache, lightheadedness, changes in vision, chest tightness/pressure,  sudden loss of speech or loss of consiousness.  Diet: Has started "eating vegan."  Excluded soda.  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  Prediabetes Previously stated "Prediabetic" Long family history of DM. Endorses polydipsia.  Denies polyphagia, polyuria, headache, chills, diaphoresis, shakiness, tingling/numbness of extremities, and changes in vision.  Low back pain and Hip pain Pains in hips and lower back - DDD known.  Hips L leg shooting/burning pain,  Yesterday/today up back with tight muscle.  Took a friend's muscle relaxer (Flexeril) and hit helped.  Today better and bearable.      Skin lesion Skin wound bilateral shoulders r/t bra strap.  Had used silcone pads to try to prevent wound, got blisters.  Has not been able to find a cross-back bra locally to change where they sit on her skin.    Social History  Substance Use Topics  . Smoking status: Current Some Day Smoker    Years: 30.00  . Smokeless tobacco: Former Systems developer  . Alcohol use Yes     Comment: occasional    Review of Systems  Constitutional: Negative.   HENT: Negative.   Eyes: Negative.   Respiratory: Negative.   Cardiovascular: Positive for palpitations and leg swelling.       Hand swelling, abdomen swelling  Gastrointestinal: Negative.   Endocrine: Positive for polydipsia and polyuria.  Genitourinary: Negative.   Musculoskeletal: Negative.   Skin: Positive for wound.  Allergic/Immunologic: Negative.   Neurological: Positive for dizziness.  Hematological: Negative.   Psychiatric/Behavioral: Negative.    Per HPI unless specifically indicated above     Objective:    BP 135/86 (BP Location: Right Arm, Patient Position: Sitting, Cuff Size: Large)   Pulse 66   Temp 98.3 F (36.8 C) (Oral)   Resp 16   Ht 5\' 9"  (1.753 m)   Wt (!) 306 lb 8 oz (139 kg)   SpO2 100%   BMI 45.26 kg/m    Wt Readings from Last 3 Encounters:  03/25/17 (!) 306 lb 8 oz (139 kg)  01/14/17 (!) 325 lb (147.4 kg)  12/01/15 (!) 324 lb (147 kg)    Physical Exam  Constitutional:  She is oriented to person, place, and time. She appears well-developed and well-nourished. No distress.  HENT:  Head: Normocephalic and atraumatic.  Right Ear: External ear normal.  Left Ear: External ear normal.  Nose: Nose normal.  Mouth/Throat: Oropharynx is clear and moist.  Eyes: Conjunctivae are normal. Pupils are equal, round, and reactive to light.  Neck: Normal range of motion. Neck supple. No JVD present. No tracheal deviation present.  Cardiovascular: Normal rate, regular rhythm, normal heart sounds and intact distal pulses.    Pulmonary/Chest: Effort normal and breath sounds normal. No respiratory distress.  Abdominal: Soft. Bowel sounds are normal. She exhibits no distension and no mass. There is no tenderness.  Musculoskeletal: She exhibits edema.       Lumbar back: She exhibits spasm.       Back:  +1 pedal edema, BUE edema.  Pt states worse in evenings than now.  Lymphadenopathy:    She has no cervical adenopathy.  Neurological: She is alert and oriented to person, place, and time.  Skin: Skin is warm and dry. Lesion noted.     Psychiatric: She has a normal mood and affect. Her behavior is normal. Judgment and thought content normal.  Vitals reviewed.   Results for orders placed or performed in visit on 03/25/17  Lipid panel  Result Value Ref Range   Cholesterol 176 <200 mg/dL   Triglycerides 99 <150 mg/dL   HDL 42 (L) >50 mg/dL   Total CHOL/HDL Ratio 4.2 <5.0 Ratio   VLDL 20 <30 mg/dL   LDL Cholesterol 114 (H) <100 mg/dL  Comprehensive metabolic panel  Result Value Ref Range   Sodium 143 135 - 146 mmol/L   Potassium 3.8 3.5 - 5.3 mmol/L   Chloride 107 98 - 110 mmol/L   CO2 26 20 - 31 mmol/L   Glucose, Bld 109 (H) 65 - 99 mg/dL   BUN 9 7 - 25 mg/dL   Creat 0.89 0.50 - 1.05 mg/dL   Total Bilirubin 0.3 0.2 - 1.2 mg/dL   Alkaline Phosphatase 84 33 - 130 U/L   AST 15 10 - 35 U/L   ALT 18 6 - 29 U/L   Total Protein 6.3 6.1 - 8.1 g/dL   Albumin 3.8 3.6 - 5.1 g/dL   Calcium 9.1 8.6 - 10.4 mg/dL  Hemoglobin A1c  Result Value Ref Range   Hgb A1c MFr Bld 6.0 (H) <5.7 %   Mean Plasma Glucose 126 mg/dL      Assessment & Plan:   Problem List Items Addressed This Visit    None    Visit Diagnoses    Bilateral lower extremity edema    -  Primary Pt has taken other patient's lasix for this symptom without concomitant KCl replacement.  Experienced cramps and replaced KCl with resolution of symptoms.    Plan: 1. Encouraged pt never to take other people's medications.  Discussed concern for  getting proper cardiac evaluation if swelling continues. 2. Restart hydrochlorothiazide 12.5 mg once daily.       Hypertension, unspecified type     Pt with prior hypertension diagnosis with prior prescription for lisinopril-HCTZ 20-12.5 mg once daily.  She is no longer taking this medication and reports "good BP" at home with improved diet and vinegar.  Plan: 1. Continue aspirin 81 mg once daily. 2. START hydrochlorothiazide 12.5 mg once daily for BP and edema. 3. Discussed need to communicate with provider before stopping medications. 4. Screen common comorbid conditions with lipid panel, CMP, HgbA1c.  Results indicate Prediabetes, hyperlipidemia (LDL only) and normal electrolytes, kidney, liver function.   Relevant Medications   aspirin EC 81 MG tablet   hydrochlorothiazide (HYDRODIURIL) 12.5 MG tablet   Other Relevant Orders   Lipid panel (Completed)   Comprehensive metabolic panel (Completed)   Hemoglobin A1c (Completed)   Prediabetes     Pt with known prediabetes not currently on medications.  Pt only beginning lifestyle modification for vegan diet.    Plan: 1. Check Hgb A1c, lipid, CMP. 2. Consider starting metformin.  Discuss w/ pt at next appointment in 4 weeks.   Relevant Orders   Lipid panel (Completed)   Comprehensive metabolic panel (Completed)   Hemoglobin A1c (Completed)   Decubitus ulcer of other site, stage 2  Pt with incomplete healing of stage 2 pressure ulcer of bra-strap location at bilateral shoulders.  Pt with large, pendulous breasts and fat deposits on shoulders creating indentation for bra straps.  Plan: 1. Protect shoulder skin with cushioning foam.  Foam dressing ordered.  Keep in place x 2 weeks. 2. Encouraged pt to go online to buy cross-back bra that will redistribute the pressure from her bra to different location to facilitate healing.    3. Pt may benefit from breast reduction surgery in future if ongoing problem.  Before this, attempts should be  made for weight loss.     Acute bilateral low back pain with left-sided sciatica     Pain likely self-limited.  Muscle strain possible complicated by job as LPN requiring lifting, pushing, pulling. Pt has taken pain medications in past.  Currently tender, hypertonic muscles of lower back and posterior hips.  Plan:  1. Treat with NSAIDs (acetaminophen and ibuprofen).  Discussed alternate dosing and max dosing. 2. Apply heat and/or ice to affected area. 3. May also apply a muscle rub with lidocaine after heat or ice. 4. Take baclofen 5 mg during day. May take 10 mg tablet tid prn for spasm for next 20 days.   5. Encourage strectching, strengthening exercises and proper body mechanics when at work.  Possible PT referral if patient agrees in future.  Not agreeable today.   Relevant Medications   acetaminophen (TYLENOL) 500 MG tablet   aspirin EC 81 MG tablet   baclofen (LIORESAL) 10 MG tablet   Encounter to establish care     Pt without recent PCP in last several years.      Meds ordered this encounter  Medications  . acetaminophen (TYLENOL) 500 MG tablet    Sig: Take 500 mg by mouth every 6 (six) hours as needed.  Marland Kitchen aspirin EC 81 MG tablet    Sig: Take 81 mg by mouth daily.  . Multiple Vitamins-Minerals (ONE-A-DAY 50 PLUS PO)    Sig: Take 1 tablet by mouth daily.  . Gauze Pads & Dressings (AMD FOAM DRESSING) 6"X6" PADS    Sig: 4 Devices by Does not apply route once.    Dispense:  4 each    Refill:  1    Order Specific Question:   Supervising Provider    Answer:   Olin Hauser [[id number]]  . hydrochlorothiazide (HYDRODIURIL) 12.5 MG tablet    Sig: Take 1 tablet (12.5 mg total) by mouth daily.    Dispense:  30 tablet    Refill:  2    Order Specific Question:   Supervising Provider    Answer:   Olin Hauser [[id number]]  . baclofen (LIORESAL) 10 MG tablet    Sig: Take 1 tablet (  10 mg total) by mouth 3 (three) times daily.    Dispense:  60 each    Refill:  0    Order  Specific Question:   Supervising Provider    Answer:   Olin Hauser [[id number]]      Follow up plan: Return in about 4 weeks (around 04/22/2017) for Blood pressure.  Cassell Smiles, DNP, AGPCNP-BC Adult Gerontology Primary Care Nurse Practitioner [facility] 03/25/2017, 2:12 PM

## 2017-03-30 LAB — COMPREHENSIVE METABOLIC PANEL
ALT: 18 U/L (ref 6–29)
AST: 15 U/L (ref 10–35)
Albumin: 3.8 g/dL (ref 3.6–5.1)
Alkaline Phosphatase: 84 U/L (ref 33–130)
BUN: 9 mg/dL (ref 7–25)
CO2: 26 mmol/L (ref 20–31)
Calcium: 9.1 mg/dL (ref 8.6–10.4)
Chloride: 107 mmol/L (ref 98–110)
Creat: 0.89 mg/dL (ref 0.50–1.05)
Glucose, Bld: 109 mg/dL — ABNORMAL HIGH (ref 65–99)
Potassium: 3.8 mmol/L (ref 3.5–5.3)
Sodium: 143 mmol/L (ref 135–146)
Total Bilirubin: 0.3 mg/dL (ref 0.2–1.2)
Total Protein: 6.3 g/dL (ref 6.1–8.1)

## 2017-03-30 LAB — LIPID PANEL
Cholesterol: 176 mg/dL (ref <200)
HDL: 42 mg/dL — ABNORMAL LOW (ref >50)
LDL Cholesterol: 114 mg/dL — ABNORMAL HIGH (ref <100)
Total CHOL/HDL Ratio: 4.2 Ratio (ref <5.0)
Triglycerides: 99 mg/dL (ref <150)
VLDL: 20 mg/dL (ref <30)

## 2017-03-30 LAB — HEMOGLOBIN A1C
Hgb A1c MFr Bld: 6 % — ABNORMAL HIGH (ref <5.7)
Mean Plasma Glucose: 126 mg/dL

## 2017-03-31 NOTE — Progress Notes (Signed)
I have reviewed this encounter including the documentation in this note and/or discussed this patient with the provider, Cassell Smiles, AGPCNP-BC. I am certifying that I agree with the content of this note as supervising physician.  Nobie Putnam, [facility] 03/31/2017, 12:55 PM

## 2017-04-01 NOTE — Progress Notes (Signed)
Patient notified 04/01/17. Shannon Bryan

## 2017-08-17 ENCOUNTER — Ambulatory Visit: Payer: Managed Care, Other (non HMO) | Admitting: Nurse Practitioner

## 2017-08-18 ENCOUNTER — Ambulatory Visit: Payer: Managed Care, Other (non HMO) | Admitting: Nurse Practitioner

## 2017-11-25 ENCOUNTER — Ambulatory Visit (INDEPENDENT_AMBULATORY_CARE_PROVIDER_SITE_OTHER): Payer: 59 | Admitting: Nurse Practitioner

## 2017-11-25 ENCOUNTER — Other Ambulatory Visit: Payer: Self-pay

## 2017-11-25 ENCOUNTER — Encounter: Payer: Self-pay | Admitting: Nurse Practitioner

## 2017-11-25 VITALS — BP 182/86 | HR 65 | Temp 99.1°F | Ht 69.0 in | Wt 312.8 lb

## 2017-11-25 DIAGNOSIS — M5442 Lumbago with sciatica, left side: Secondary | ICD-10-CM

## 2017-11-25 DIAGNOSIS — J0140 Acute pansinusitis, unspecified: Secondary | ICD-10-CM

## 2017-11-25 DIAGNOSIS — I10 Essential (primary) hypertension: Secondary | ICD-10-CM | POA: Diagnosis not present

## 2017-11-25 MED ORDER — DOXYCYCLINE HYCLATE 100 MG PO TABS: 100.0000 mg | ORAL_TABLET | Freq: Two times a day (BID) | ORAL | 0 refills | Status: DC

## 2017-11-25 MED ORDER — HYDROCHLOROTHIAZIDE 25 MG PO TABS: ORAL_TABLET | ORAL | 5 refills | Status: DC

## 2017-11-25 MED ORDER — BACLOFEN 10 MG PO TABS: 10.0000 mg | ORAL_TABLET | Freq: Three times a day (TID) | ORAL | 0 refills | Status: DC

## 2017-11-25 NOTE — Patient Instructions (Addendum)
Shannon Bryan, Thank you for coming in to clinic today.  1. You have a muscle spasm after your degenerative disc disease. - Start taking Tylenol extra strength 1 to 2 tablets every 6-8 hours for aches or fever/chills for next few days as needed.  Do not take more than 3,000 mg in 24 hours from all medicines.  Avoid ibuprofen. - Use heat and ice.  Apply this for 15 minutes at a time 6-8 times per day.   - Muscle rub with lidocaine, lidocaine patch, Biofreeze, or tiger balm for topical pain relief.  Avoid using this with heat and ice to avoid burns. - START muscle relaxer baclofen 10 mg one tablet up to three times daily.  This can cause drowsiness, so use caution.  It may be best to only take this at night for helping you during sleep.  2. It sounds like you have an Upper Respiratory Bacterial infection.  Recommend good hand washing. - START taking doxycycline 100 mg twice daily for 10 days.  Make sure to take all doses of your antibiotic. - While you are on an antibiotic, take a probiotic.  Antibiotics kill good and bad bacteria.  A probiotic helps to replace your good bacteria. Probiotic pills can be found over the counter.  One brand is Florastor, but you can use any brand you prefer.  You can also get good bacteria from foods.  These foods are yogurt, kefir, kombucha, and fresh, refrigerated and uncooked sauerkraut. - Drink plenty of fluids.  - You may also use Flonase 2 sprays each nostril daily for up to 4-6 weeks - START antihistamine Claritin or Zyrtec  Other over the counter medications you may try, if needed for symptoms are: - If congestion is worse, start OTC Mucinex (or may try Mucinex-DM for cough) up to 7-10 days then stop - You may try over the counter Nasal Saline spray (Simply Saline, Ocean Spray) as needed to reduce congestion. - Drink warm herbal tea with honey for sore throat.   If symptoms are significantly worse with persistent fevers/chills despite tylenol/ibpurofen, nausea,  vomiting unable to tolerate food/fluids or medicine, body aches, or shortness of breath, sinus pain pressure or worsening productive cough, then follow-up for re-evaluation, may seek more immediate care at Urgent Care or the ED if you are more concerned that it is an emergency.  3. Blood pressure - Resume your hydrochlorothiazide.  Please schedule a follow-up appointment with Cassell Smiles, AGNP. Return in about 4 weeks (around 12/23/2017) for blood pressure.  If you have any other questions or concerns, please feel free to call the clinic or send a message through [place]. You may also schedule an earlier appointment if necessary.  You will receive a survey after today's visit either digitally by e-mail or paper by [employer]. Your experiences and feedback matter to [place].  Please respond so we know how we are doing as we provide care for you.   Cassell Smiles, DNP, AGNP-BC Adult Gerontology Nurse Practitioner [place]

## 2017-11-25 NOTE — Progress Notes (Signed)
Subjective:    Patient ID: Shannon Bryan, female    DOB: 02/24/1961, 57 y.o.   MRN: [id number]  Shannon Bryan is a 57 y.o. female presenting on 11/25/2017 for Hypertension (elevated bp since yesterday, with sharp pulsation pain in the abdomen x 1 day ) and Sinus Problem (sinus pressure)   HPI Hypertension - She is checking BP at home or outside of clinic with the following readings over last 24 hours: 1100: 206/70 1830:190/96  0200: 180/112  1313: 198/119.    - Current medications: none as pt has dealt with multiple deaths in family, did not get med refills of hctz 12.5 mg after last visit.   When taking, patient tolerated the medication well - She is symptomatic with headache and leg swelling. - Has had some face/neck swelling.  Was using vinegar and lemon water and swelling improved.  Started drinking 8-9 32 oz ice/water tumblers and is not urinating much.  Headache was worse this week.  - Ibuprofen 800 mg has helped headache.  BP resolved some, but increased again even though headache did not return. - Had been taking tylenol  For headacehs, was concerned for that medication, but has been taking ibuprofen for last 1 month, Headaches started last week, but would go away.  325 aspirin in am with tylenol would help.   650 mg tylenol x 2 tabs, taking more than 3 times per day.  Felt like back muscles wer tightening up.  Used lidocaine patches and is not working anymore. - Pt denies lightheadedness, dizziness, changes in vision, chest tightness/pressure, palpitations, sudden loss of speech or loss of consciousness. - She  reports no regular exercise routine. - Her diet is high in salt, moderate in fat, and high in carbohydrates.   Low back pain: pt reports DDD lumbar spine Pt has had prior provider treat her back pain and she has had good relief with baclofen in past.  States it helped muscle spasms and helped relax the pain.   Sinus pressure Pt reports sinus pressure and congestion.   Has had this for about 1 month with LAD both ears are hurting.   Social History   Tobacco Use  . Smoking status: Current Some Day Smoker    Packs/day: 0.10    Years: 30.00    Pack years: 3.00  . Smokeless tobacco: Former Systems developer  . Tobacco comment: 1/4 - 1/2 ppd had quit for 1 year in 2010  Substance Use Topics  . Alcohol use: Yes    Comment: occasional  . Drug use: No    Review of Systems Per HPI unless specifically indicated above     Objective:    BP (!) 182/86 (BP Location: Right Arm, Patient Position: Sitting, Cuff Size: Large)   Pulse 65   Temp 99.1 F (37.3 C) (Oral)   Ht 5\' 9"  (1.753 m)   Wt (!) 312 lb 12.8 oz (141.9 kg)   BMI 46.19 kg/m   Wt Readings from Last 3 Encounters:  12/22/17 (!) 302 lb 9.6 oz (137.3 kg)  11/25/17 (!) 312 lb 12.8 oz (141.9 kg)  03/25/17 (!) 306 lb 8 oz (139 kg)    Physical Exam  General - morbidly obese, well-appearing, NAD HEENT - Normocephalic, atraumatic, PERRL, EOMI, patent nares w/o congestion, oropharynx clear, MMM, TM bulging without erythema, Ear canal normal, external ear normal w/o lesions.  Sinus tenderness over frontal and maxillary sinuses bilaterally.  No appreciable facial swelling. Neck - supple, non-tender, mild cervical LAD Heart - RRR,  no murmurs heard Lungs - Clear throughout all lobes, no wheezing, crackles, or rhonchi. Normal work of breathing. Extremeties - non-tender, no edema, cap refill < 2 seconds, peripheral pulses intact +2 bilaterally Skin - warm, dry, no rashes Neuro - awake, alert, oriented x3, CN II-X intact, intact muscle strength 5/5 bilaterally, intact distal sensation to light touch, normal coordination, normal gait Psych - Normal mood and affect, normal behavior   Results for orders placed or performed in visit on 03/25/17  Lipid panel  Result Value Ref Range   Cholesterol 176 <200 mg/dL   Triglycerides 99 <150 mg/dL   HDL 42 (L) >50 mg/dL   Total CHOL/HDL Ratio 4.2 <5.0 Ratio   VLDL 20 <30  mg/dL   LDL Cholesterol 114 (H) <100 mg/dL  Comprehensive metabolic panel  Result Value Ref Range   Sodium 143 135 - 146 mmol/L   Potassium 3.8 3.5 - 5.3 mmol/L   Chloride 107 98 - 110 mmol/L   CO2 26 20 - 31 mmol/L   Glucose, Bld 109 (H) 65 - 99 mg/dL   BUN 9 7 - 25 mg/dL   Creat 0.89 0.50 - 1.05 mg/dL   Total Bilirubin 0.3 0.2 - 1.2 mg/dL   Alkaline Phosphatase 84 33 - 130 U/L   AST 15 10 - 35 U/L   ALT 18 6 - 29 U/L   Total Protein 6.3 6.1 - 8.1 g/dL   Albumin 3.8 3.6 - 5.1 g/dL   Calcium 9.1 8.6 - 10.4 mg/dL  Hemoglobin A1c  Result Value Ref Range   Hgb A1c MFr Bld 6.0 (H) <5.7 %   Mean Plasma Glucose 126 mg/dL      Assessment & Plan:   Problem List Items Addressed This Visit    None    Visit Diagnoses    Essential hypertension    -  Primary Severe and uncontrolled hypertension.  BP at goal.  Pt not currently working on lifestyle modifications.  Taking medications tolerating well without side effects.   Plan: 1. RESUME hydrochlorothiazide 25 mg once daily.  Cannot make BP titration without being on therapy previously prescribed. 2. Obtain labs CMP  3. Encouraged heart healthy diet and increasing exercise to 30 minutes most days of the week. 4. Check BP 1-2 x per week at home, keep log, and bring to clinic at next appointment. 5. Follow up 4 weeks.     Acute bilateral low back pain with left-sided sciatica     Pain exacerbation likely self-limited.  Muscle strain possible complicated by degenerative disc disease.  Plan:  1. Treat with OTC pain meds (acetaminophen and ibuprofen).  Discussed alternate dosing and max dosing. 2. Apply heat and/or ice to affected area. 3. May also apply a muscle rub with lidocaine or lidocaine patch after heat or ice. 4. Take muscle relaxer baclofen 10 mg up to three times daily.  Cautioned drowsiness. 5. Follow up 4-6 weeks prn   Relevant Medications   baclofen (LIORESAL) 10 MG tablet   Acute non-recurrent pansinusitis       Consistent with URI and secondary sinusitis with symptoms worsening over the past 7 days and initial symptoms of nasal congestion and sinus pressure over 2 weeks ago.   Plan: 1.START taking doxycycline 100 mg tablets every 12 hours for 10 days.  Discussed completing antibiotic. - While on antibiotic, take a probiotic OTC or from food. - Start Atrovent nasal spray decongestant 2 sprays each nostril up to 4 times daily for 5-7 days -  Continue anti-histamine loratadine 10mg  daily. - Can use Flonase 2 sprays each nostril daily for up to 4-6 weeks if no epistaxis. - Start Mucinex-DM OTC for  7-10 days prn congestion 2. Supportive care with nasal saline, warm herbal tea with honey, 3. Improve hydration 4. Tylenol / Motrin PRN fevers  5. Return criteria given       Meds ordered this encounter  Medications  . hydrochlorothiazide (HYDRODIURIL) 25 MG tablet    Sig: Take 1/2 tablet (12.5 mg) for 7 days.  Then, increase to 1 full tablet (25 mg) once daily.    Dispense:  30 tablet    Refill:  5    Order Specific Question:   Supervising Provider    Answer:   Olin Hauser [[id number]]  . baclofen (LIORESAL) 10 MG tablet    Sig: Take 1 tablet (10 mg total) by mouth 3 (three) times daily.    Dispense:  60 each    Refill:  0    Order Specific Question:   Supervising Provider    Answer:   Olin Hauser [[id number]]  . doxycycline (VIBRA-TABS) 100 MG tablet    Sig: Take 1 tablet (100 mg total) by mouth 2 (two) times daily.    Dispense:  20 tablet    Refill:  0    Order Specific Question:   Supervising Provider    Answer:   Olin Hauser [[id number]]    Follow up plan: Return in about 4 weeks (around 12/23/2017) for blood pressure.  Cassell Smiles, DNP, AGPCNP-BC Adult Gerontology Primary Care Nurse Practitioner [facility] 12/25/2017, 2:50 PM

## 2017-12-22 ENCOUNTER — Other Ambulatory Visit: Payer: Self-pay

## 2017-12-22 ENCOUNTER — Encounter: Payer: Self-pay | Admitting: Nurse Practitioner

## 2017-12-22 ENCOUNTER — Ambulatory Visit: Payer: 59 | Admitting: Nurse Practitioner

## 2017-12-22 VITALS — BP 115/80 | HR 108 | Temp 100.4°F | Resp 18 | Ht 69.0 in | Wt 302.6 lb

## 2017-12-22 DIAGNOSIS — I10 Essential (primary) hypertension: Secondary | ICD-10-CM | POA: Diagnosis not present

## 2017-12-22 DIAGNOSIS — J101 Influenza due to other identified influenza virus with other respiratory manifestations: Secondary | ICD-10-CM

## 2017-12-22 DIAGNOSIS — R509 Fever, unspecified: Secondary | ICD-10-CM

## 2017-12-22 LAB — POCT INFLUENZA A/B
Influenza A, POC: POSITIVE — AB
Influenza B, POC: NEGATIVE

## 2017-12-22 MED ORDER — OSELTAMIVIR PHOSPHATE 75 MG PO CAPS
75.0000 mg | ORAL_CAPSULE | Freq: Two times a day (BID) | ORAL | 0 refills | Status: AC
Start: 2017-12-22 — End: 2017-12-27

## 2017-12-22 MED ORDER — IPRATROPIUM BROMIDE 0.06 % NA SOLN: 2.0000 | Freq: Four times a day (QID) | NASAL | 0 refills | Status: DC

## 2017-12-22 MED ORDER — BENZONATATE 100 MG PO CAPS: 100.0000 mg | ORAL_CAPSULE | Freq: Three times a day (TID) | ORAL | 0 refills | Status: DC | PRN

## 2017-12-22 MED ORDER — HYDROCHLOROTHIAZIDE 25 MG PO TABS: 25.0000 mg | ORAL_TABLET | Freq: Every day | ORAL | 1 refills | Status: DC

## 2017-12-22 NOTE — Patient Instructions (Signed)
Shannon Bryan, Thank you for coming in to clinic today.  1. You tested positive for Influenza A today (rapid flu swab test in office) - Start Tamiflu (anti-flu medicine) take one capsule 75mg  twice a day for 5 days - Wash hands and cover cough very well to avoid spread of infection - For symptom control:      - Take Ibuprofen / Advil 400-600mg  every 6-8 hours as needed for fever / muscle aches.  You may also take Tylenol 500-1000mg  per dose every 6-8 hours or 3 times a day.  You can alternate dosing and take both in the same day.      - Do not take more than 3,000 mg acetaminophen (Tylenol) in a day.      - Start Atrovent nasal spray decongestant 2 sprays in each nostril up to 4 times daily for 7 days.      - Start taking tessalon 100 mg up to three times daily for cough.      - Start OTC Mucinex-DM for cough and congestion for up to 7 days. - Improve hydration with plenty of clear fluids.  Drink up to 8 glasses of water / fluids each day.  If significant worsening with poor fluid intake, worsening fever, difficulty breathing due to coughing, worsening body aches, weakness, or other more concerning symptoms difficulty breathing you can seek treatment at Emergency Department. If your flu symptoms have improved and then get worse several days to a week later with concerns for bronchitis, productive cough, fever, and chills we may need to check for possible pneumonia that can occur after the flu.  Please schedule a follow-up appointment with Cassell Smiles, AGNP in 1-2 weeks as needed if worsening from Flu / Bronchitis  If you have any other questions or concerns, please feel free to call the clinic or send a message through [place]. You may also schedule an earlier appointment if necessary.  You will receive a survey after today's visit either digitally by e-mail or paper by [employer]. Your experiences and feedback matter to [place].  Please respond so we know how we are doing as we provide care for  you.   Cassell Smiles, DNP, AGNP-BC Adult Gerontology Nurse Practitioner [place]

## 2017-12-22 NOTE — Progress Notes (Signed)
Subjective:    Patient ID: Shannon Bryan, female    DOB: [birth year], 57 y.o.   MRN: [id number]  Doryce Mcgregory is a 57 y.o. female presenting on 12/22/2017 for Influenza (chills, bodyaches, and fever x 2 days )   HPI URI Symptoms started Tuesday night with full body aches, headache, chills, fever, sweats, cough, rhinorrhea, malaise. - Lemonade, water are only intake since Tuesday night outside of a bite of rice and a bite of toast.  She has no appetite and has diarrhea immediately after eating even small amounts of food. 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 - She has been taking mucinex DM without relief.  Hypertension BP is very stable outside clinic. - She is checking BP at home or outside of clinic.  Readings 110-120/70-80 - Current medications: hydrochlorothiazide 25 mg once daily and is tolerating well without side effects. - She is symptomatic with flu symptoms above.  Prior to illness, pt was asymptomatic. - Pt denies headache, lightheadedness, dizziness, changes in vision, chest tightness/pressure, palpitations, leg swelling, sudden loss of speech or loss of consciousness. - She  reports no regular exercise routine. - Her diet is moderate in salt, moderate in fat, and moderate in carbohydrates.  Social History   Tobacco Use  . Smoking status: Current Some Day Smoker    Packs/day: 0.10    Years: 30.00    Pack years: 3.00  . Smokeless tobacco: Former Systems developer  . Tobacco comment: 1/4 - 1/2 ppd had quit for 1 year in 2010  Substance Use Topics  . Alcohol use: Yes    Comment: occasional  . Drug use: No    Review of Systems Per HPI unless specifically indicated above     Objective:    BP 115/80 (BP Location: Right Arm, Patient Position: Sitting, Cuff Size: Large)   Pulse (!) 108   Temp (!) 100.4 F (38 C) (Oral)   Resp 18   Ht 5\' 9"  (1.753 m)   Wt (!) 302 lb 9.6 oz (137.3 kg)   SpO2 100%   BMI 44.69 kg/m   Wt  Readings from Last 3 Encounters:  12/22/17 (!) 302 lb 9.6 oz (137.3 kg)  11/25/17 (!) 312 lb 12.8 oz (141.9 kg)  03/25/17 (!) 306 lb 8 oz (139 kg)    Physical Exam  General - obese, sickly-appearing, mild to moderate distress HEENT - Normocephalic, atraumatic, PERRL, EOMI, edematous nasal turbinates w/ congestion, oropharynx edematous/erythematous, MMM, TM, Ear canal normal, external ear normal w/o lesions Neck - supple, non-tender, moderate cervical LAD Heart - RRR, no murmurs heard Lungs - Rhonchi throughout all lobes clears with coughing, no wheezing, no crackles. Normal work of breathing.  Frequent cough. Extremeties - non-tender, no edema, cap refill < 2 seconds, peripheral pulses intact +2 bilaterally Skin - warm, dry, no rashes Neuro - awake, alert, oriented x3, normal gait Psych - Normal mood and affect, normal behavior   Results for orders placed or performed in visit on 12/22/17  POCT Influenza A/B  Result Value Ref Range   Influenza A, POC Positive (A) Negative   Influenza B, POC Negative Negative      Assessment & Plan:   Problem List Items Addressed This Visit      Cardiovascular and Mediastinum   Essential hypertension Controlled hypertension.  BP at goal.  Pt is minimally working on lifestyle modifications.  Taking medications tolerating well without side effects. No currently identified complications.  Plan: 1.  Continue taking hydrochlorothiazide 25 mg once daily 2. Obtain labs CMP at next visit. 3. Encouraged heart healthy diet and increasing exercise to 30 minutes most days of the week. 4. Check BP 1-2 x per week at home, keep log, and bring to clinic at next appointment. 5. Follow up 3 months.    Relevant Medications   hydrochlorothiazide (HYDRODIURIL) 25 MG tablet    Other Visit Diagnoses    Influenza A    -  Primary   Relevant Medications   oseltamivir (TAMIFLU) 75 MG capsule   ipratropium (ATROVENT) 0.06 % nasal spray   benzonatate (TESSALON) 100 MG  capsule   Fever and chills       Relevant Medications   oseltamivir (TAMIFLU) 75 MG capsule   Other Relevant Orders   POCT Influenza A/B (Completed)  Clinically consistent with flu and confirmed Influenza A today on rapid flu test, with sick contact flu at her place of employment. - Duration x 2 days, without complication. Tolerating PO liquids and well hydrated - No other focal findings of infection today - Did not receive influenza vaccine this season  Plan: 1. Start Tamiflu 75mg  capsules BID x 5 days 2. Supportive care as advised with NSAID / Tylenol PRN fever/myalgias, improve hydration, may take OTC Cold/Flu meds 3. Tessalon for cough 100 mg tid prn 4. Atrovent nasal spray 1 spray each nostril 6 x per day prn rhinorrhea 5. Return criteria given if significant worsening, consider post-influenza complications, otherwise follow-up if needed 1-2 weeks    Meds ordered this encounter  Medications  . hydrochlorothiazide (HYDRODIURIL) 25 MG tablet    Sig: Take 1 tablet (25 mg total) by mouth daily.    Dispense:  90 tablet    Refill:  1    Order Specific Question:   Supervising Provider    Answer:   Olin Hauser [[id number]]  . oseltamivir (TAMIFLU) 75 MG capsule    Sig: Take 1 capsule (75 mg total) by mouth 2 (two) times daily for 5 days.    Dispense:  10 capsule    Refill:  0    Order Specific Question:   Supervising Provider    Answer:   Olin Hauser [[id number]]  . ipratropium (ATROVENT) 0.06 % nasal spray    Sig: Place 2 sprays into both nostrils 4 (four) times daily.    Dispense:  15 mL    Refill:  0    Order Specific Question:   Supervising Provider    Answer:   Olin Hauser [[id number]]  . benzonatate (TESSALON) 100 MG capsule    Sig: Take 1 capsule (100 mg total) by mouth 3 (three) times daily as needed for cough.    Dispense:  30 capsule    Refill:  0    Order Specific Question:   Supervising Provider    Answer:   Olin Hauser [[id number]]       Follow up plan: Followup 1-2 weeks post flu.  Caution bronchitis/pneumonia.   Followup 3 months for BP.  Cassell Smiles, DNP, AGPCNP-BC Adult Gerontology Primary Care Nurse Practitioner [facility] 12/22/2017, 11:41 AM

## 2017-12-23 ENCOUNTER — Ambulatory Visit: Payer: 59 | Admitting: Nurse Practitioner

## 2017-12-25 ENCOUNTER — Encounter: Payer: Self-pay | Admitting: Nurse Practitioner

## 2018-04-10 ENCOUNTER — Other Ambulatory Visit: Payer: Self-pay

## 2018-04-10 ENCOUNTER — Ambulatory Visit: Payer: 59 | Admitting: Nurse Practitioner

## 2018-04-10 ENCOUNTER — Emergency Department: Payer: 59

## 2018-04-10 ENCOUNTER — Emergency Department
Admission: EM | Admit: 2018-04-10 | Discharge: 2018-04-10 | Disposition: A | Discharge status: Home / Self Care | Payer: 59 | Attending: Emergency Medicine | Admitting: Emergency Medicine

## 2018-04-10 DIAGNOSIS — I129 Hypertensive chronic kidney disease with stage 1 through stage 4 chronic kidney disease, or unspecified chronic kidney disease: Secondary | ICD-10-CM | POA: Diagnosis not present

## 2018-04-10 DIAGNOSIS — M79605 Pain in left leg: Secondary | ICD-10-CM | POA: Diagnosis not present

## 2018-04-10 DIAGNOSIS — N189 Chronic kidney disease, unspecified: Secondary | ICD-10-CM | POA: Diagnosis not present

## 2018-04-10 DIAGNOSIS — N3000 Acute cystitis without hematuria: Secondary | ICD-10-CM | POA: Insufficient documentation

## 2018-04-10 DIAGNOSIS — M545 Low back pain: Secondary | ICD-10-CM | POA: Diagnosis present

## 2018-04-10 DIAGNOSIS — F17210 Nicotine dependence, cigarettes, uncomplicated: Secondary | ICD-10-CM | POA: Insufficient documentation

## 2018-04-10 DIAGNOSIS — M549 Dorsalgia, unspecified: Secondary | ICD-10-CM

## 2018-04-10 DIAGNOSIS — M5416 Radiculopathy, lumbar region: Secondary | ICD-10-CM

## 2018-04-10 DIAGNOSIS — Z7982 Long term (current) use of aspirin: Secondary | ICD-10-CM | POA: Insufficient documentation

## 2018-04-10 LAB — URINALYSIS, ROUTINE W REFLEX MICROSCOPIC
BILIRUBIN URINE: NEGATIVE
GLUCOSE, UA: NEGATIVE mg/dL
Hgb urine dipstick: NEGATIVE
KETONES UR: NEGATIVE mg/dL
NITRITE: NEGATIVE
PH: 8 (ref 5.0–8.0)
Protein, ur: NEGATIVE mg/dL
Specific Gravity, Urine: 1.005 (ref 1.005–1.030)

## 2018-04-10 LAB — POCT PREGNANCY, URINE: Preg Test, Ur: NEGATIVE

## 2018-04-10 IMAGING — CT CT RENAL STONE PROTOCOL
2 of 4 series · 15 of 46 positions shown, 17 images · non-contrast
Comparison: None.

CLINICAL DATA: Back pain radiating to the pelvic floor for months.
History of kidney stones, degenerative spine, hysterectomy,
cholecystectomy.

EXAM:
CT ABDOMEN AND PELVIS WITHOUT CONTRAST
CT LUMBAR SPINE WITHOUT CONTRAST
TECHNIQUE: Multidetector CT imaging of the abdomen and pelvis was performed
following the standard protocol without IV contrast. CT reformations
of the lumbar spine obtained.

[Series 2: stone full standard · axial · 0.79mm/px · z∈[-171,+299]mm · 12 of 104 slices shown, 14 images]
[im 5/104  soft-tissue]
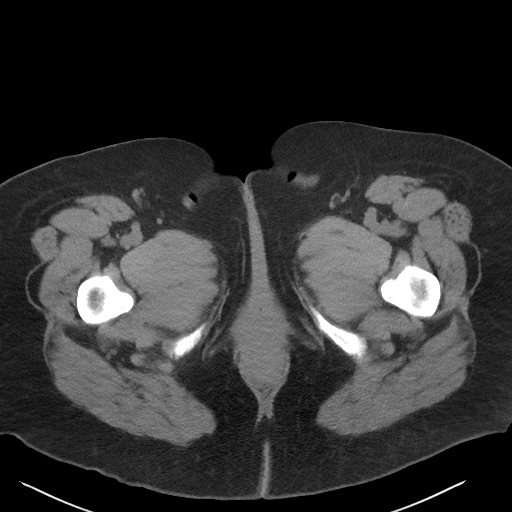
[im 5/104  bone]
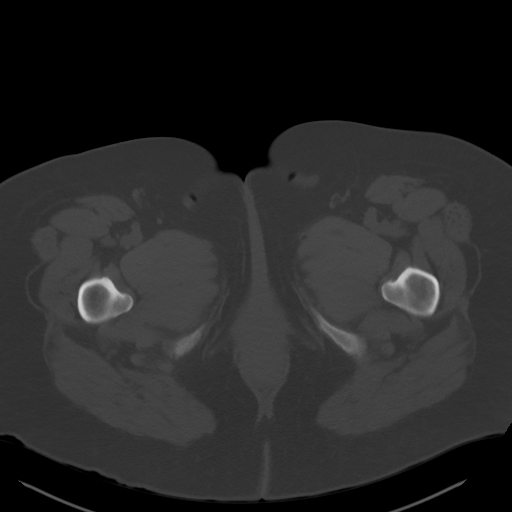
[im 14/104  soft-tissue]
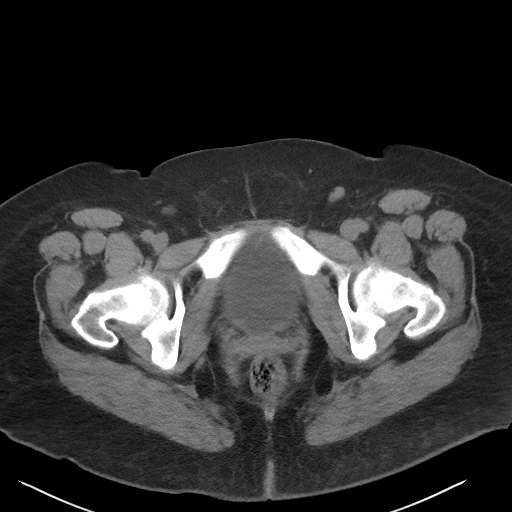
[im 23/104  soft-tissue]
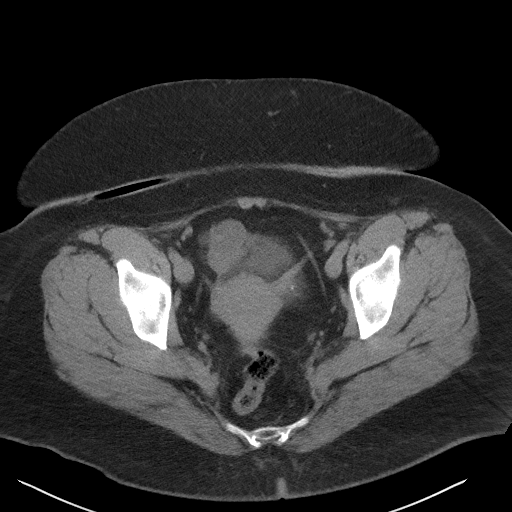
[im 32/104  soft-tissue]
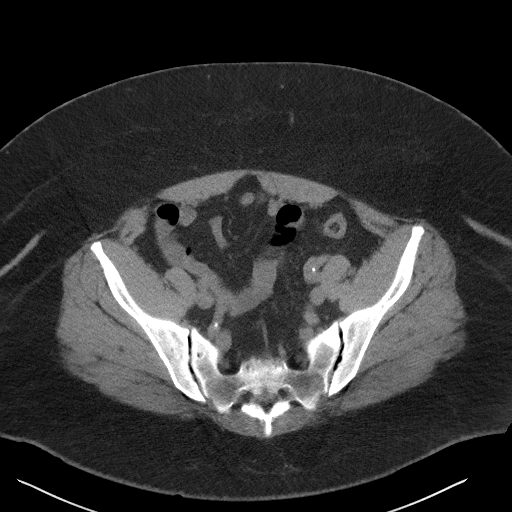
[im 41/104  soft-tissue]
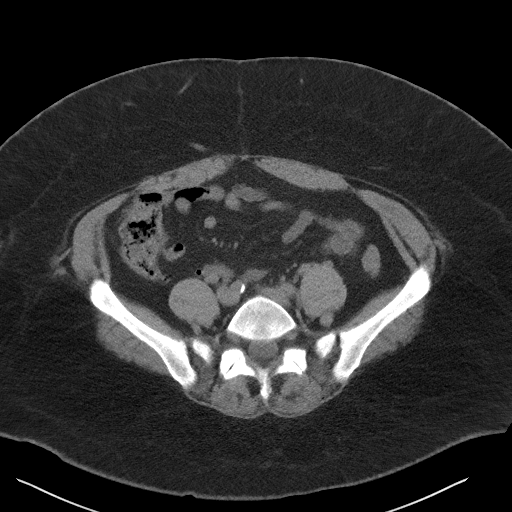
[im 50/104  soft-tissue]
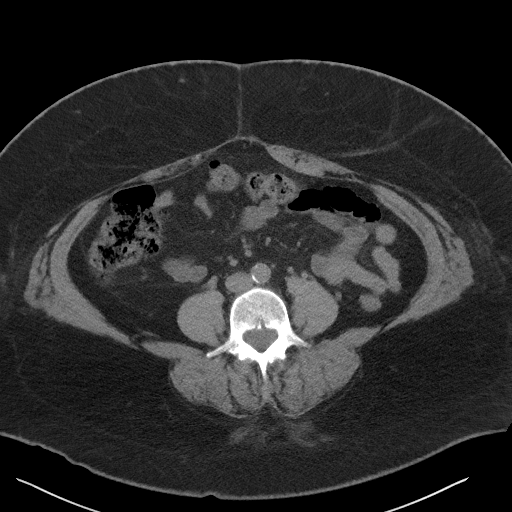
[im 54/104  soft-tissue]
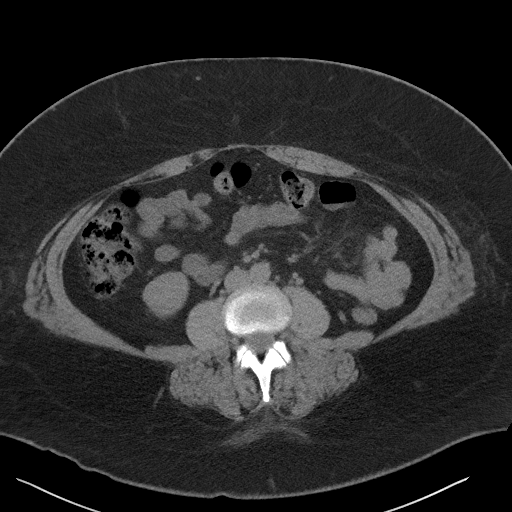
[im 63/104  soft-tissue]
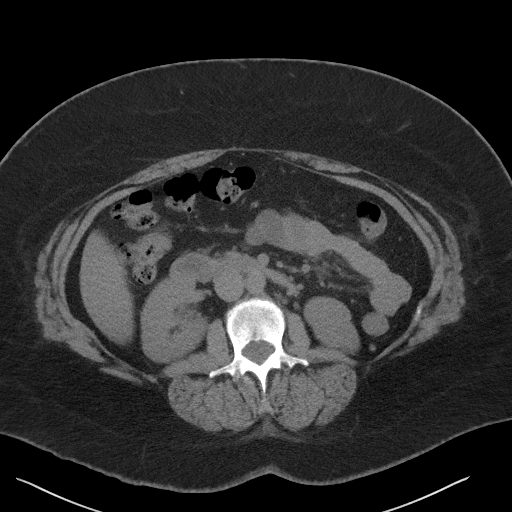
[im 72/104  soft-tissue]
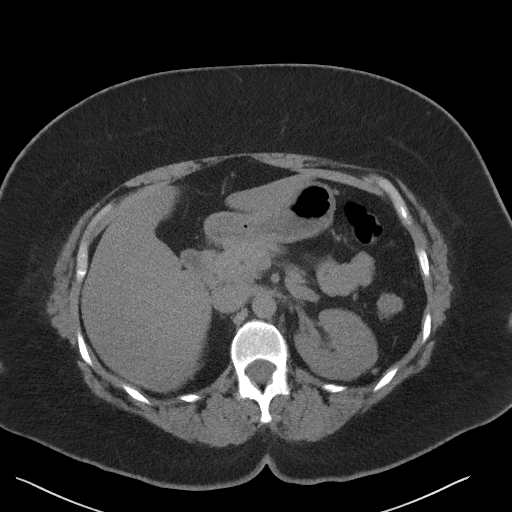
[im 72/104  bone]
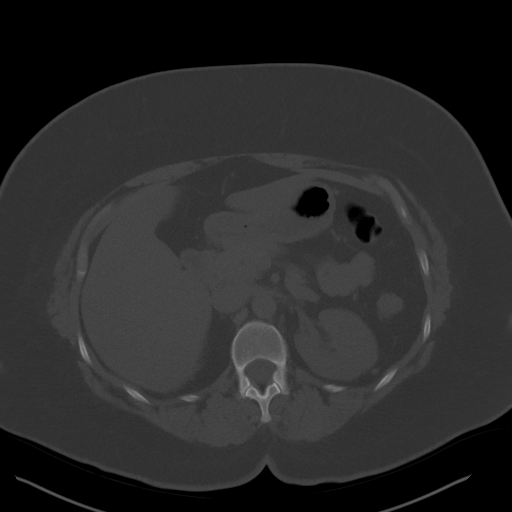
[im 81/104  soft-tissue]
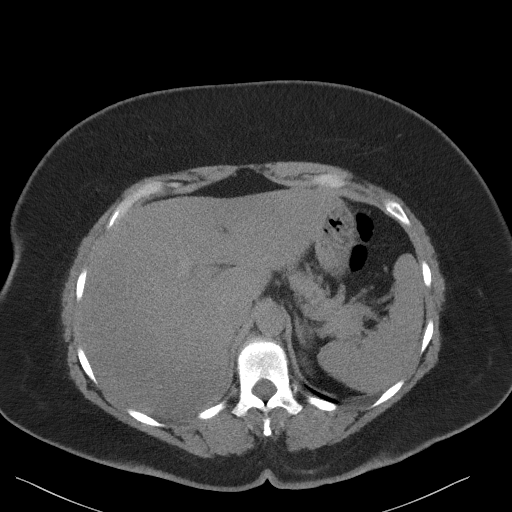
[im 90/104  soft-tissue]
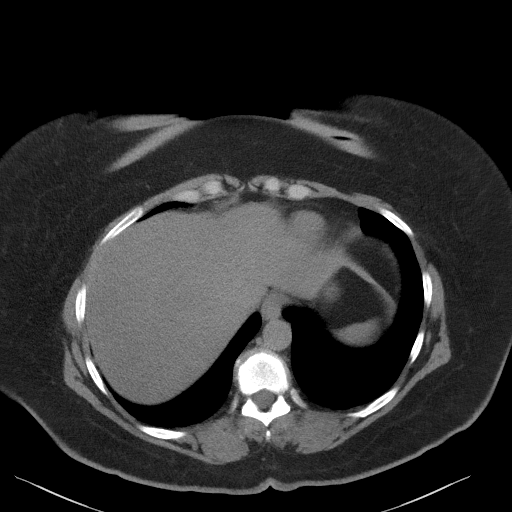
[im 99/104  soft-tissue]
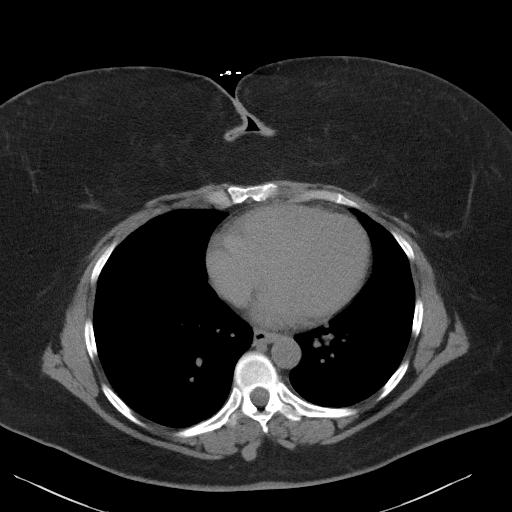

[Series 5: coronal · coronal · 0.78mm/px · 3 of 151 slices shown]
[im 51/151  soft-tissue]
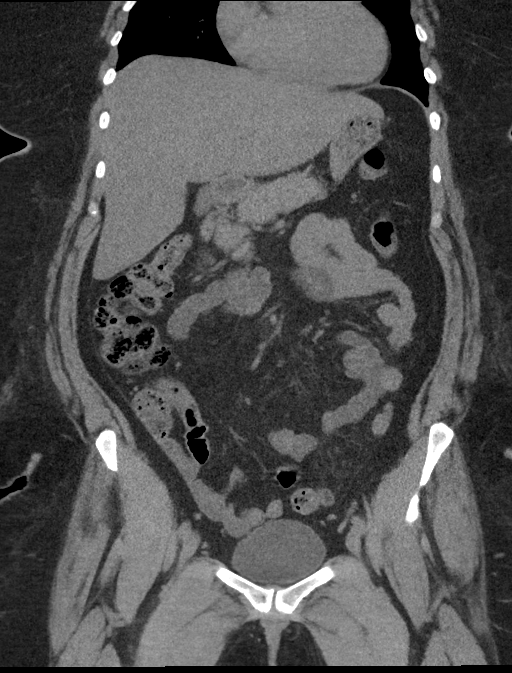
[im 67/151  soft-tissue]
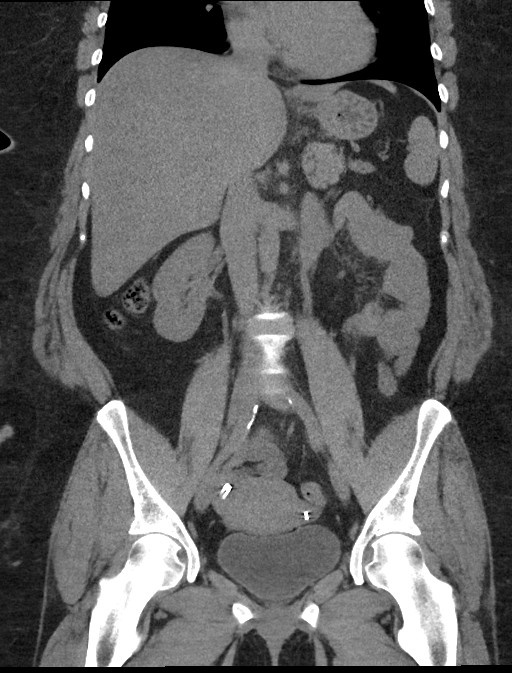
[im 84/151  soft-tissue]
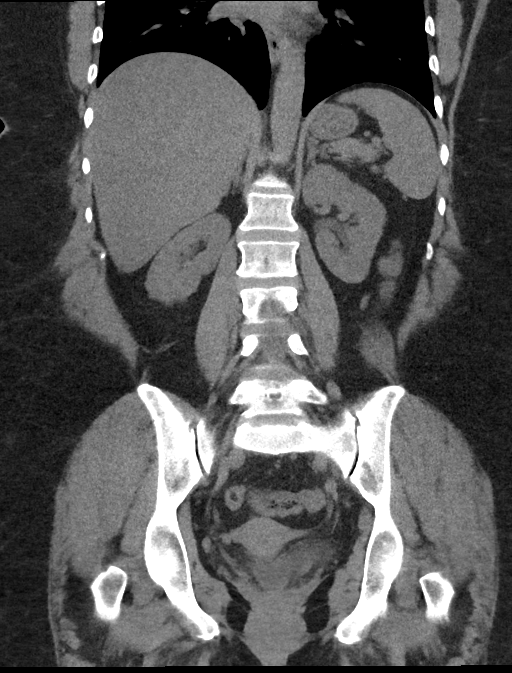

[15 of 46 positions shown; findings below may reference images not displayed]

FINDINGS: Large body habitus results in overall noisy image quality.

CT ABDOMEN AND PELVIS FINDINGS

LOWER CHEST: Lung bases are clear. The visualized heart size is
normal. No pericardial effusion.

HEPATOBILIARY: Status post cholecystectomy. Mildly hypodense liver
compatible with steatosis.

PANCREAS: Normal.

SPLEEN: Normal.

ADRENALS/URINARY TRACT: Kidneys are orthotopic, demonstrating normal
size and morphology. Fullness of the RIGHT renal collecting system
without frank hydronephrosis. No nephrolithiasis; limited assessment
for renal masses by nonenhanced CT. The unopacified ureters are
normal in course and caliber. Urinary bladder is partially distended
and unremarkable. Normal adrenal glands.

STOMACH/BOWEL: The stomach, small and large bowel are normal in
course and caliber without inflammatory changes, sensitivity
decreased by lack of enteric contrast. Normal appendix.

VASCULAR/LYMPHATIC: Aortoiliac vessels are normal in course and
caliber. Trace calcific atherosclerosis. No lymphadenopathy by CT
size criteria. Phleboliths in the pelvis.

REPRODUCTIVE: Normal.  Tubal ligation clips in situ.

OTHER: No intraperitoneal free fluid or free air.

MUSCULOSKELETAL: Non-acute. Moderate sacroiliac osteoarthrosis.
Moderate thoracic spondylosis.

CT LUMBAR SPINE FINDINGS

SEGMENTATION: For the purposes of this report the last well-formed
intervertebral disc space is reported as L5-S1.

ALIGNMENT: Maintained lumbar lordosis. No malalignment.

VERTEBRAE: Vertebral bodies and posterior elements are intact.
Intervertebral disc heights preserved. No destructive bony lesions.

PARASPINAL AND OTHER SOFT TISSUES: As above.

DISC LEVELS:

T12-L1 through L5-S1: No disc bulge, canal stenosis nor neural
foraminal narrowing. Moderate L4-5 and L5-S1 facet arthropathy.
IMPRESSION: CT abdomen and pelvis:

1. Mild fullness of the RIGHT renal collecting system seen with
urinary tract infection or recently passed stone. No nephrolithiasis
or frank hydronephrosis.
2. Hepatic steatosis.

CT lumbar spine:

1. No fracture or malalignment.
2. No spondylosis.  Moderate L4-5 and L5-S1 facet arthropathy.

Aortic Atherosclerosis (0U32E-PPZ.Z).

## 2018-04-10 IMAGING — US US EXTREM LOW VENOUS*L*
1 series · 13 of 24 positions shown · non-contrast
Comparison: None.

CLINICAL DATA: 56-year-old with left leg pain and swelling.



[Series 1: us extrem low venous*left* · 0.08mm/px · 13 of 38 slices shown]
[im 1/38]
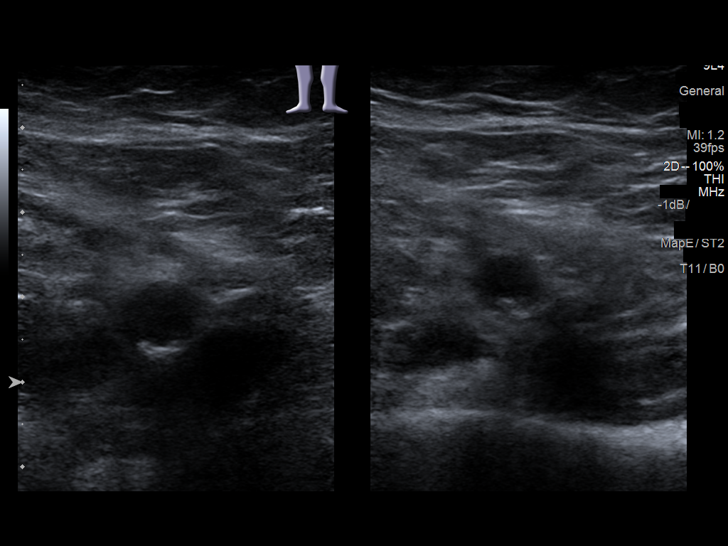
[im 4/38]
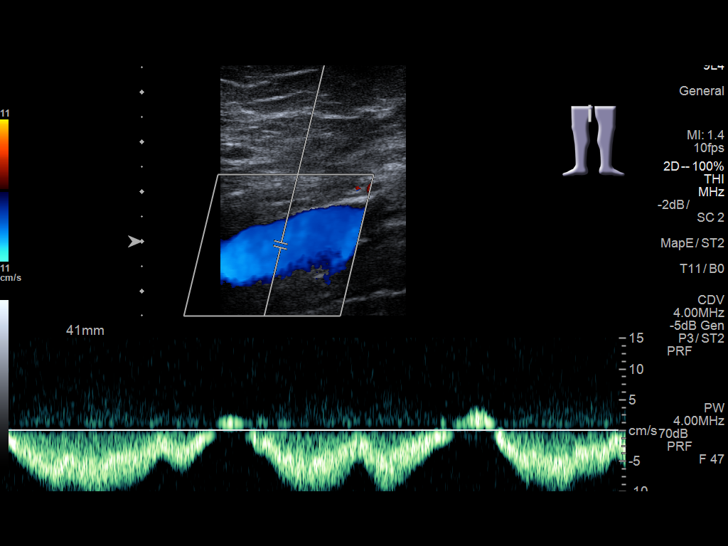
[im 7/38]
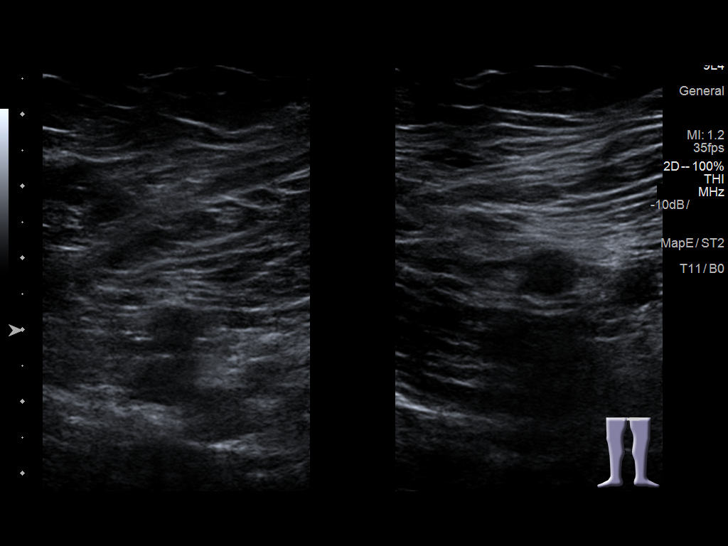
[im 10/38]
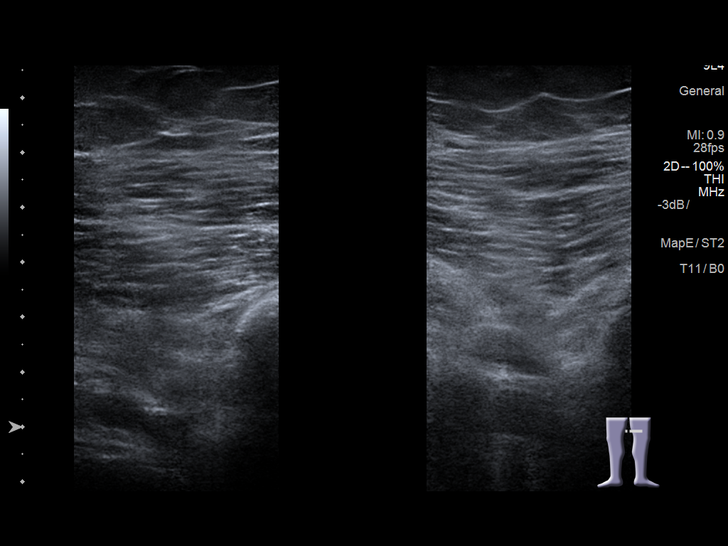
[im 13/38]
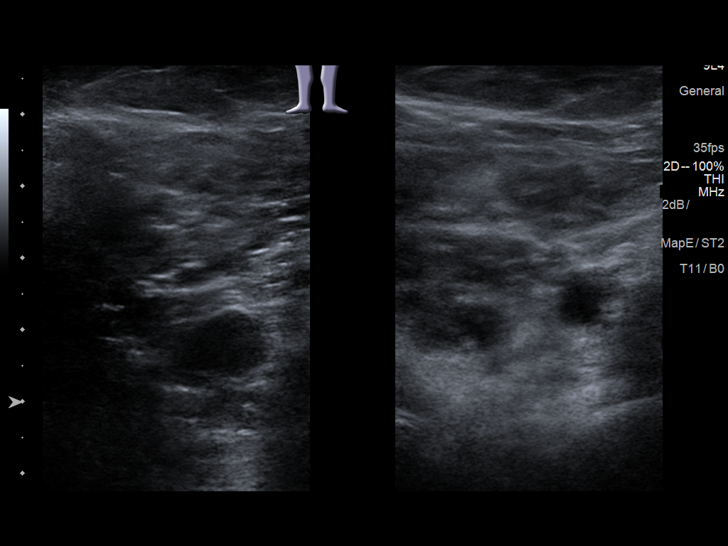
[im 17/38]
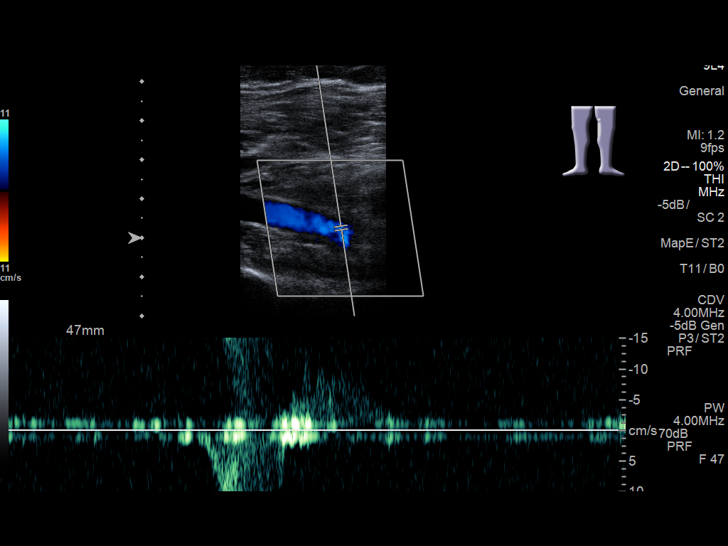
[im 20/38]
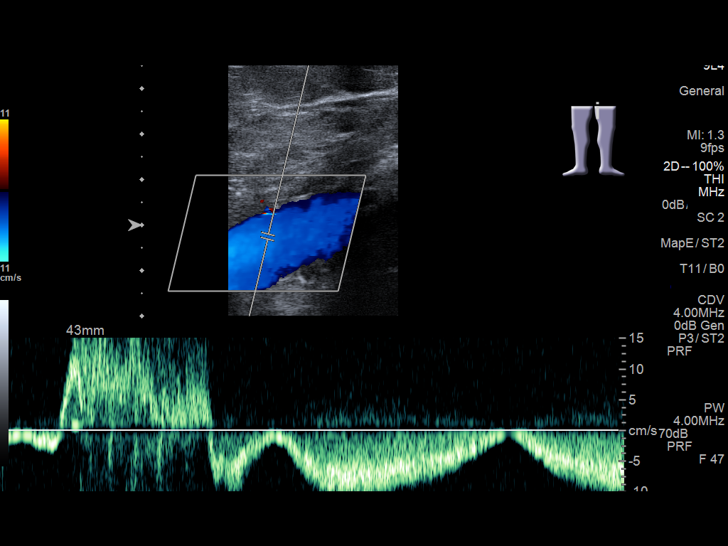
[im 21/38]
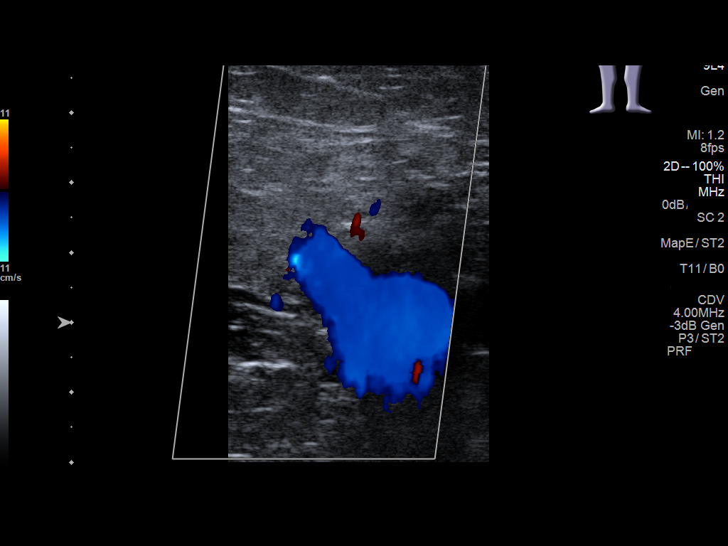
[im 25/38]
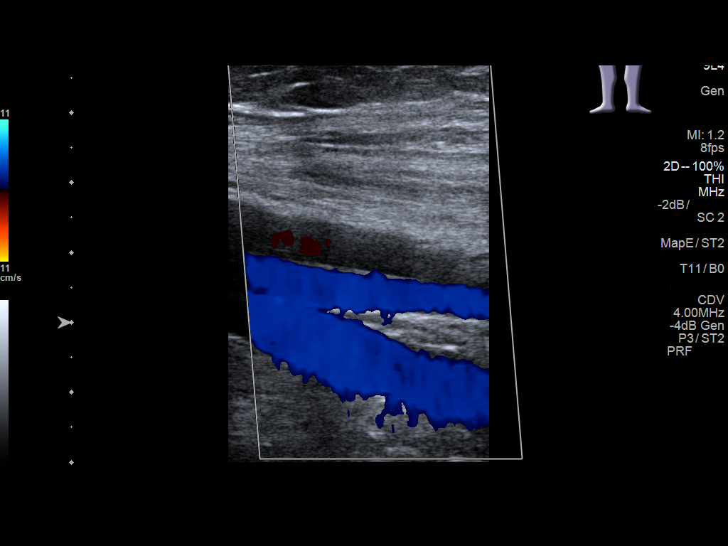
[im 28/38]
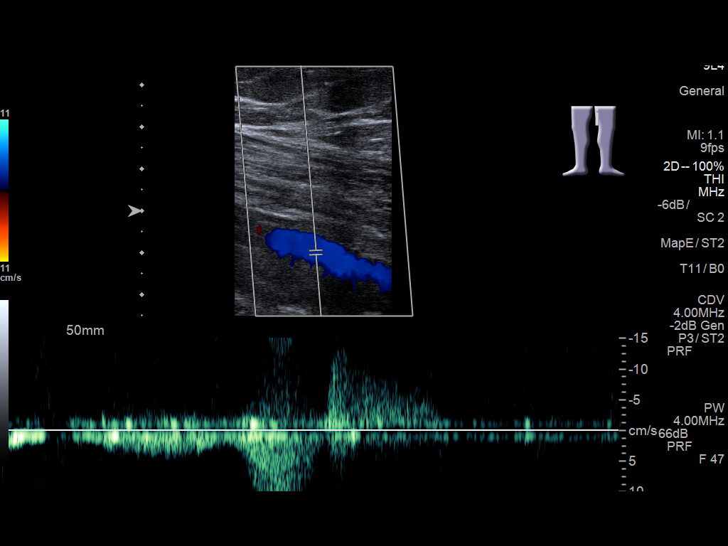
[im 31/38]
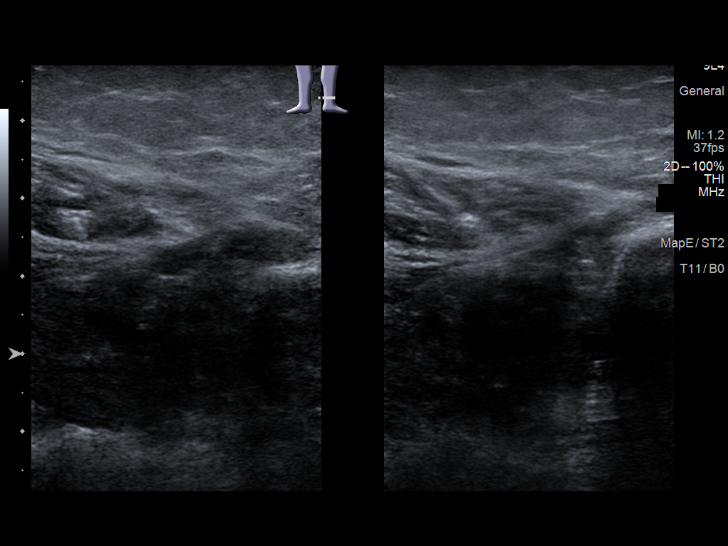
[im 34/38]
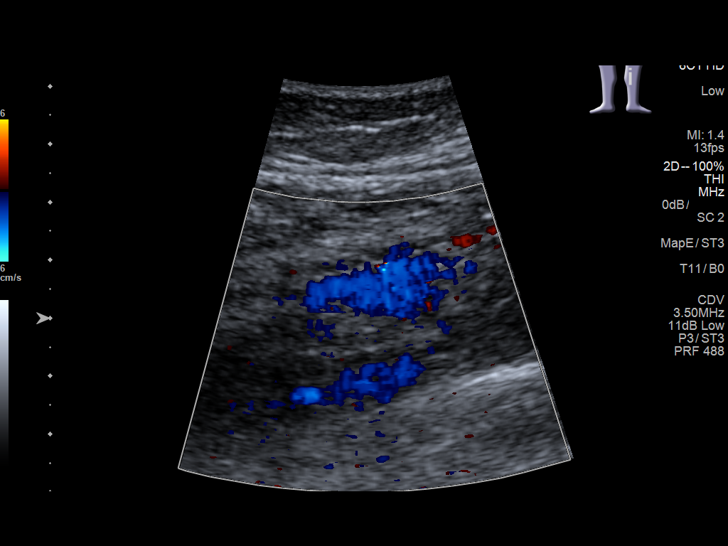
[im 38/38]
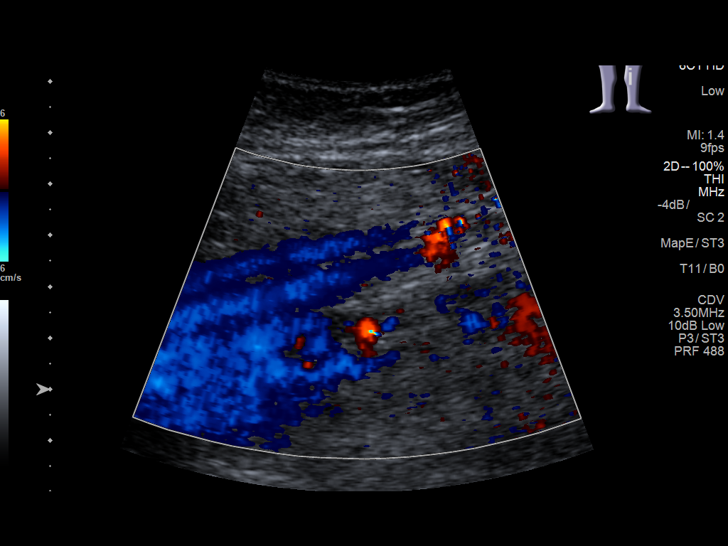

[13 of 24 positions shown; findings below may reference images not displayed]

FINDINGS: Contralateral Common Femoral Vein: Respiratory phasicity is normal
and symmetric with the symptomatic side. No evidence of thrombus.
Normal compressibility.

Common Femoral Vein: No evidence of thrombus. Normal
compressibility, respiratory phasicity and response to augmentation.

Saphenofemoral Junction: No evidence of thrombus. Normal
compressibility and flow on color Doppler imaging.

Profunda Femoral Vein: No evidence of thrombus. Normal
compressibility and flow on color Doppler imaging.

Femoral Vein: No evidence of thrombus. Normal compressibility,
respiratory phasicity and response to augmentation.

Popliteal Vein: No evidence of thrombus. Normal compressibility,
respiratory phasicity and response to augmentation.

Calf Veins: No evidence of thrombus. Normal compressibility and flow
on color Doppler imaging.

Other Findings:  None.
IMPRESSION: Negative for deep venous thrombosis in left lower extremity.

## 2018-04-10 IMAGING — CT CT L SPINE W/O CM
3 of 9 series · 14 of 33 positions shown, 17 images · non-contrast
Comparison: None.

CLINICAL DATA: Back pain radiating to the pelvic floor for months.
History of kidney stones, degenerative spine, hysterectomy,
cholecystectomy.

EXAM:
CT ABDOMEN AND PELVIS WITHOUT CONTRAST
CT LUMBAR SPINE WITHOUT CONTRAST
TECHNIQUE: Multidetector CT imaging of the abdomen and pelvis was performed
following the standard protocol without IV contrast. CT reformations
of the lumbar spine obtained.

[Series 1: axl l spine st · axial · 0.32mm/px · z∈[-48,+152]mm · 6 of 139 slices shown, 8 images]
[im 20/139  soft-tissue]
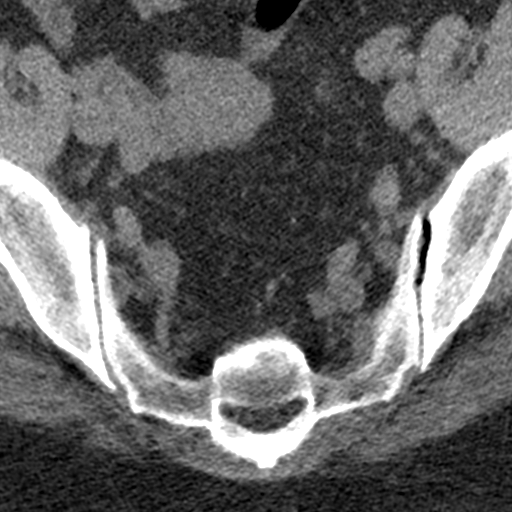
[im 20/139  bone]
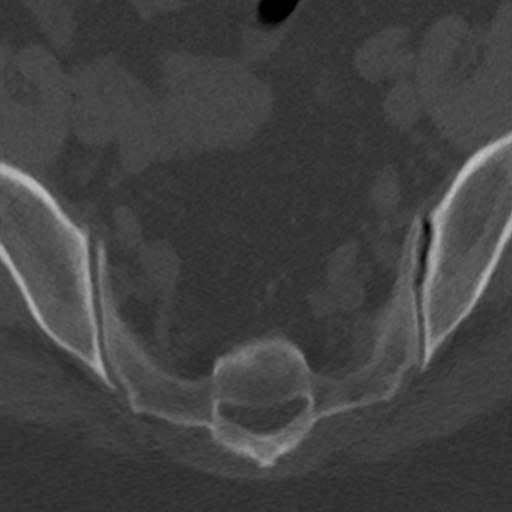
[im 40/139  bone]
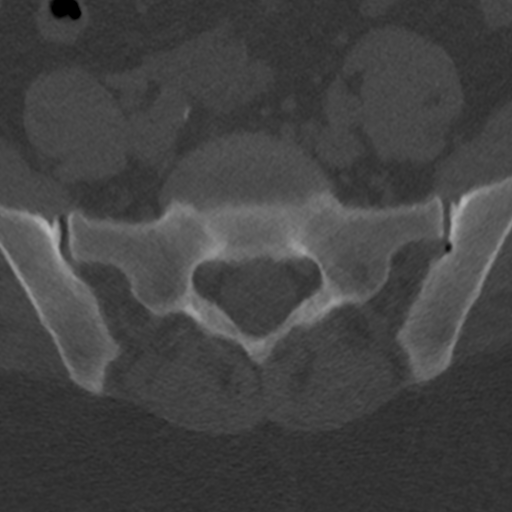
[im 60/139  bone]
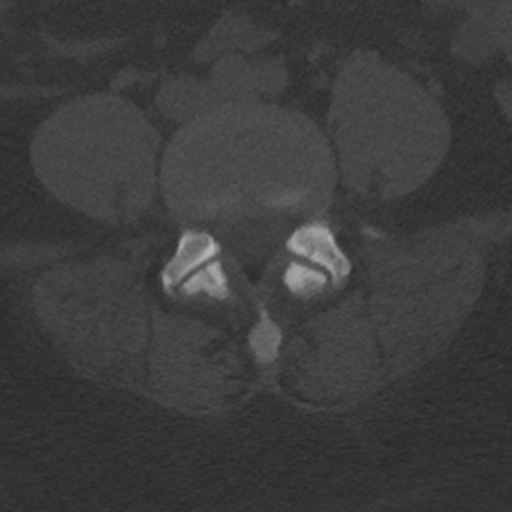
[im 79/139  bone]
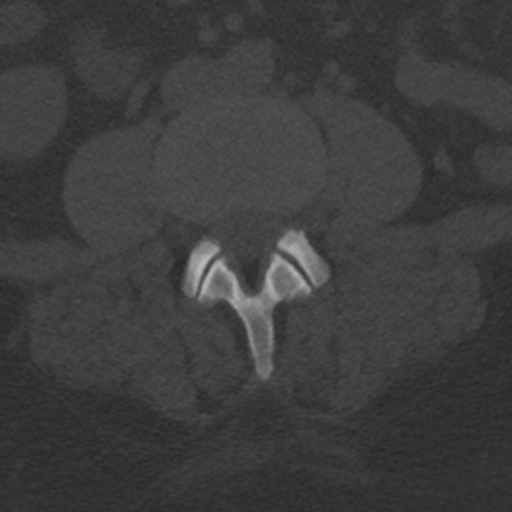
[im 99/139  soft-tissue]
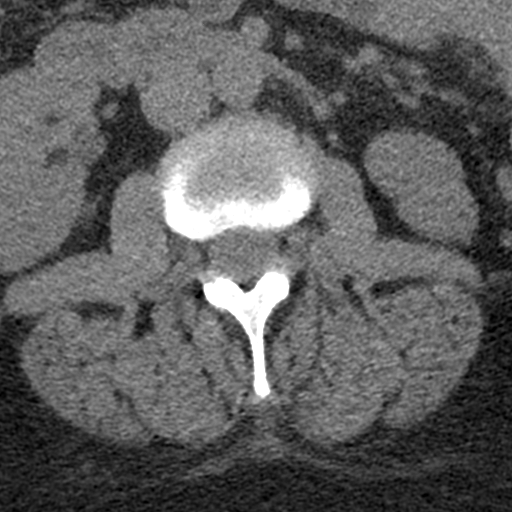
[im 99/139  bone]
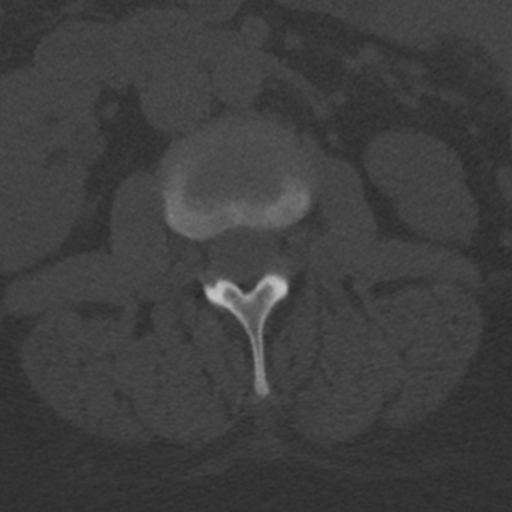
[im 119/139  bone]
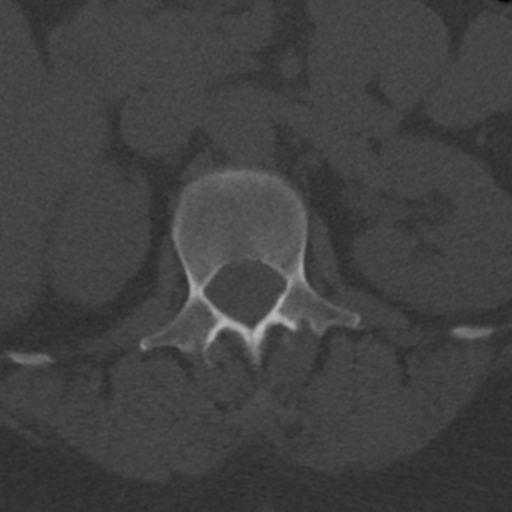

[Series 5: cor l spine bone · coronal · 0.54mm/px · 3 of 76 slices shown]
[im 16/76  bone]
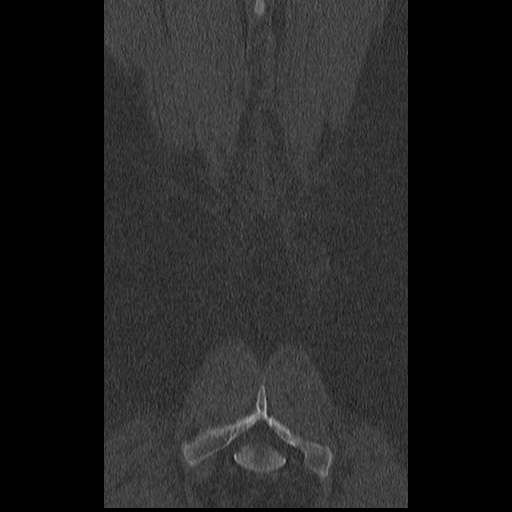
[im 31/76  bone]
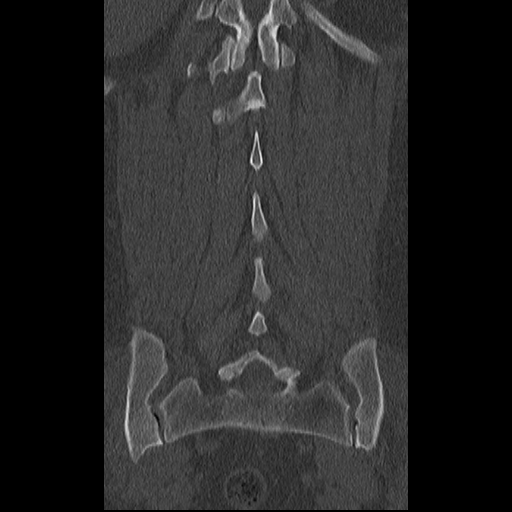
[im 46/76  bone]
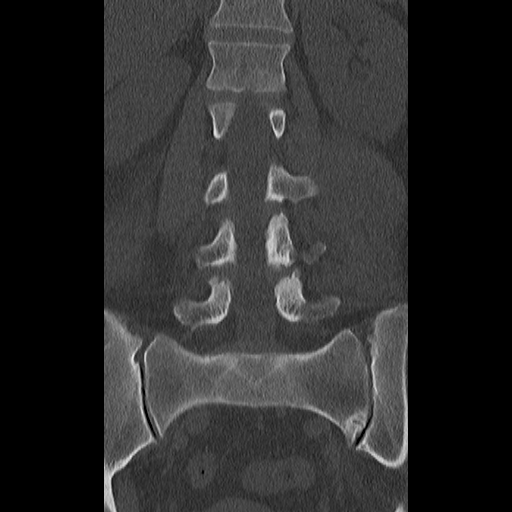

[Series 6: sag l spine (id) · sagittal · 0.54mm/px · 5 of 73 slices shown, 6 images]
[im 25/73  bone]
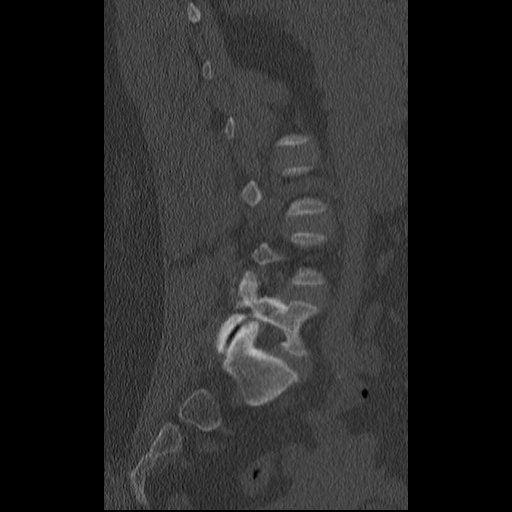
[im 31/73  bone]
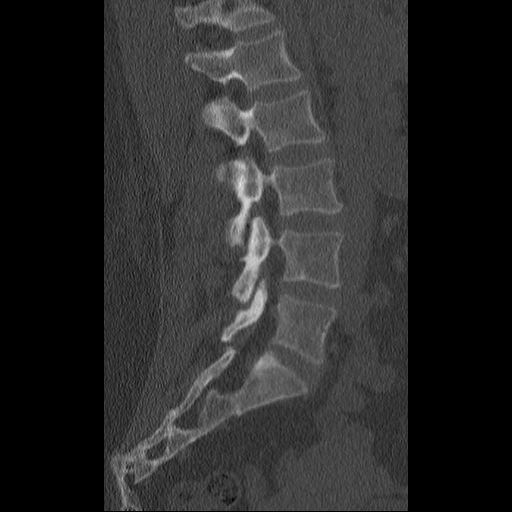
[im 37/73  soft-tissue]
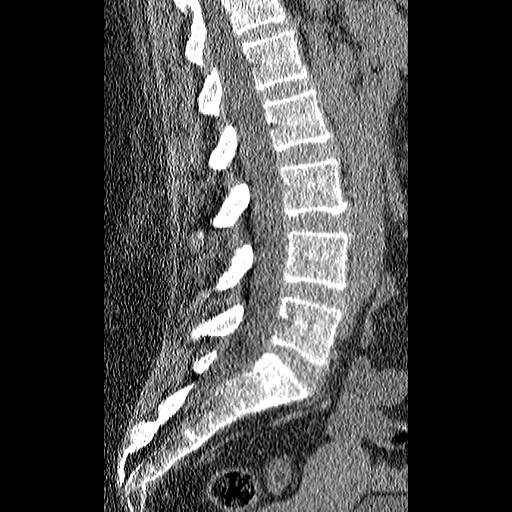
[im 37/73  bone]
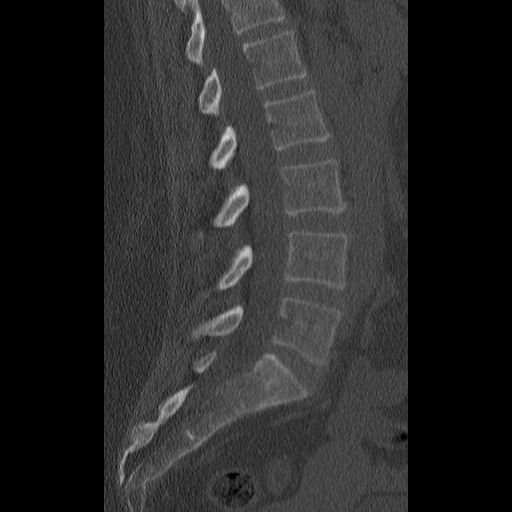
[im 43/73  bone]
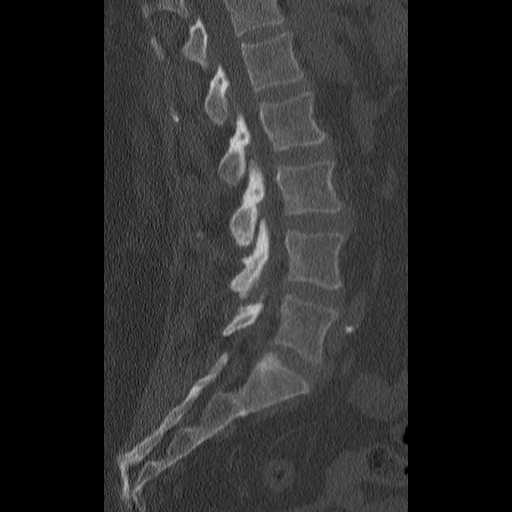
[im 49/73  bone]
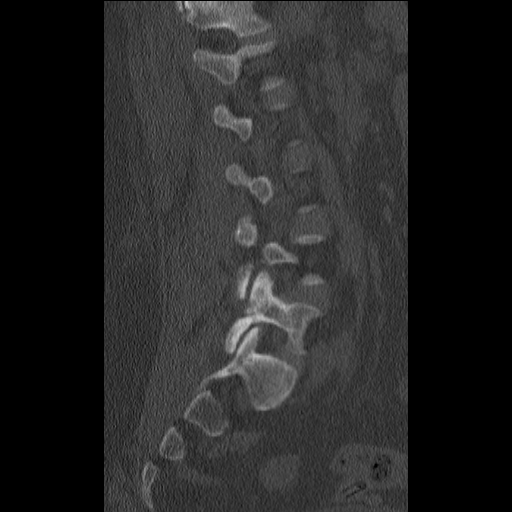

[14 of 33 positions shown; findings below may reference images not displayed]

FINDINGS: Large body habitus results in overall noisy image quality.

CT ABDOMEN AND PELVIS FINDINGS

LOWER CHEST: Lung bases are clear. The visualized heart size is
normal. No pericardial effusion.

HEPATOBILIARY: Status post cholecystectomy. Mildly hypodense liver
compatible with steatosis.

PANCREAS: Normal.

SPLEEN: Normal.

ADRENALS/URINARY TRACT: Kidneys are orthotopic, demonstrating normal
size and morphology. Fullness of the RIGHT renal collecting system
without frank hydronephrosis. No nephrolithiasis; limited assessment
for renal masses by nonenhanced CT. The unopacified ureters are
normal in course and caliber. Urinary bladder is partially distended
and unremarkable. Normal adrenal glands.

STOMACH/BOWEL: The stomach, small and large bowel are normal in
course and caliber without inflammatory changes, sensitivity
decreased by lack of enteric contrast. Normal appendix.

VASCULAR/LYMPHATIC: Aortoiliac vessels are normal in course and
caliber. Trace calcific atherosclerosis. No lymphadenopathy by CT
size criteria. Phleboliths in the pelvis.

REPRODUCTIVE: Normal.  Tubal ligation clips in situ.

OTHER: No intraperitoneal free fluid or free air.

MUSCULOSKELETAL: Non-acute. Moderate sacroiliac osteoarthrosis.
Moderate thoracic spondylosis.

CT LUMBAR SPINE FINDINGS

SEGMENTATION: For the purposes of this report the last well-formed
intervertebral disc space is reported as L5-S1.

ALIGNMENT: Maintained lumbar lordosis. No malalignment.

VERTEBRAE: Vertebral bodies and posterior elements are intact.
Intervertebral disc heights preserved. No destructive bony lesions.

PARASPINAL AND OTHER SOFT TISSUES: As above.

DISC LEVELS:

T12-L1 through L5-S1: No disc bulge, canal stenosis nor neural
foraminal narrowing. Moderate L4-5 and L5-S1 facet arthropathy.
IMPRESSION: CT abdomen and pelvis:

1. Mild fullness of the RIGHT renal collecting system seen with
urinary tract infection or recently passed stone. No nephrolithiasis
or frank hydronephrosis.
2. Hepatic steatosis.

CT lumbar spine:

1. No fracture or malalignment.
2. No spondylosis.  Moderate L4-5 and L5-S1 facet arthropathy.

Aortic Atherosclerosis (0U32E-PPZ.Z).

## 2018-04-10 MED ORDER — NITROFURANTOIN MONOHYD MACRO 100 MG PO CAPS: 100.0000 mg | ORAL_CAPSULE | Freq: Two times a day (BID) | ORAL | 0 refills | Status: AC

## 2018-04-10 MED ORDER — LIDOCAINE 5 % EX PTCH: 1.0000 | MEDICATED_PATCH | Freq: Two times a day (BID) | CUTANEOUS | 0 refills | Status: AC

## 2018-04-10 MED ORDER — CYCLOBENZAPRINE HCL 5 MG PO TABS: ORAL_TABLET | ORAL | 0 refills | Status: DC

## 2018-04-10 NOTE — ED Notes (Signed)
See triage note  Presents with lower back pain   States pain has been there for awhile  But states pain is now moving into lower abd /pelvic  No fever or trauma

## 2018-04-10 NOTE — Discharge Instructions (Signed)
Discontinue Baclofen.

## 2018-04-10 NOTE — ED Triage Notes (Signed)
Pt states "I have a major problem. 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 States urinary frequency.

## 2018-04-10 NOTE — ED Provider Notes (Signed)
[facility] Emergency Department Provider Note  ____________________________________________  Time seen: Approximately 3:21 PM  I have reviewed the triage vital signs and the nursing notes.   HISTORY  Chief Complaint Back Pain    HPI Shannon Bryan is a 57 y.o. female with PMH degenerative disc disease, HTN, kidney stone, DVT that presents to emergency department for evaluation of bilateral low back pain, worse on the right left that radiates into left side and into left leg for 1 to 2 weeks.  Patient states that when she walks she can feel something "slip".  Pain improves when standing still and worsens when active.  Patient states that her left leg is painful and swollen. Pain radiates down the back of her leg. She has had a blood clot in this leg previously.  She has had some urinary frequency.  She is concerned that she has a bladder infection or kidney stone.  She smokes 1 to 2 cigarettes/week.  No recent surgeries.  No recent travel.  She is not on any hormonal birth control.  She denies vaginal discharge and any concern for STD.  She is not sexually active. She did not come to the emergency department earlier because she could not find anyone to work for her.  She denies fever, chills, nausea, vomiting, abdominal pain, bowel or bladder dysfunction, saddle paresthesias, hematuria.    Past Medical History:  Diagnosis Date  . Asthma   . Blood clot in vein   . Chronic kidney disease   . Hyperlipidemia   . Hypertension   . Kidney stone     Patient Active Problem List   Diagnosis Date Noted  . Essential hypertension 12/22/2017    Past Surgical History:  Procedure Laterality Date  . ABDOMINAL HYSTERECTOMY    . CHOLECYSTECTOMY    . ROTATOR CUFF REPAIR Right   . TUBAL LIGATION      Prior to Admission medications   Medication Sig Start Date End Date Taking? Authorizing Provider  acetaminophen (TYLENOL) 500 MG tablet Take 500 mg by mouth every 6 (six)  hours as needed.    [provider]  aspirin EC 81 MG tablet Take 81 mg by mouth daily.    [provider]  baclofen (LIORESAL) 10 MG tablet Take 1 tablet (10 mg total) by mouth 3 (three) times daily. 11/25/17   Mikey College, NP  benzonatate (TESSALON) 100 MG capsule Take 1 capsule (100 mg total) by mouth 3 (three) times daily as needed for cough. 12/22/17   Mikey College, NP  cyclobenzaprine (FLEXERIL) 5 MG tablet Take 1-2 tablets 3 times daily as needed 04/10/18   Laban Emperor, PA-C  hydrochlorothiazide (HYDRODIURIL) 25 MG tablet Take 1 tablet (25 mg total) by mouth daily. 12/22/17   Mikey College, NP  ibuprofen (ADVIL,MOTRIN) 800 MG tablet Take 1 tablet (800 mg total) by mouth every 8 (eight) hours as needed. 11/24/15   Triplett, Cari B, FNP  ipratropium (ATROVENT) 0.06 % nasal spray Place 2 sprays into both nostrils 4 (four) times daily. 12/22/17   Mikey College, NP  lidocaine (LIDODERM) 5 % Place 1 patch onto the skin every 12 (twelve) hours. Remove & Discard patch within 12 hours or as directed by MD 04/10/18 04/10/19  Laban Emperor, PA-C  Multiple Vitamins-Minerals (ONE-A-DAY 50 PLUS PO) Take 1 tablet by mouth daily.    [provider]  nitrofurantoin, macrocrystal-monohydrate, (MACROBID) 100 MG capsule Take 1 capsule (100 mg total) by mouth 2 (two) times  daily for 7 days. 04/10/18 04/17/18  Laban Emperor, PA-C    Allergies Bactrim [sulfamethoxazole-trimethoprim]; Penicillins; and Cleocin [clindamycin hcl]  Family History  Problem Relation Age of Onset  . Heart disease Mother   . Stroke Mother   . Diabetes Mother   . Cancer Mother        bone  . Depression Mother     Social History Social History   Tobacco Use  . Smoking status: Current Some Day Smoker    Packs/day: 0.10    Years: 30.00    Pack years: 3.00  . Smokeless tobacco: Former Systems developer  . Tobacco comment: 1/4 - 1/2 ppd had quit for 1 year in 2010  Substance Use Topics   . Alcohol use: Yes    Comment: occasional  . Drug use: No    Types: Cocaine, Marijuana     Review of Systems  Constitutional: No fever/chills Cardiovascular: No chest pain. Respiratory: No SOB. Gastrointestinal: No abdominal pain.  No nausea, no vomiting.  Musculoskeletal: Positive for back pain.  Skin: Negative for rash, abrasions, lacerations, ecchymosis. Neurological: Negative for headaches, numbness or tingling   ____________________________________________   PHYSICAL EXAM:  VITAL SIGNS: ED Triage Vitals  Enc Vitals Group     BP 04/10/18 1416 (!) 149/97     Pulse Rate 04/10/18 1416 71     Resp 04/10/18 1416 18     Temp 04/10/18 1416 98.8 F (37.1 C)     Temp Source 04/10/18 1416 Oral     SpO2 04/10/18 1416 99 %     Weight --      Height 04/10/18 1417 5\' 9"  (1.753 m)     Head Circumference --      Peak Flow --      Pain Score 04/10/18 1417 6     Pain Loc --      Pain Edu? --      Excl. in [place]? --      Constitutional: Alert and oriented. Well appearing and in no acute distress. Eyes: Conjunctivae are normal. PERRL. EOMI. Head: Atraumatic. ENT:      Ears:      Nose: No congestion/rhinnorhea.      Mouth/Throat: Mucous membranes are moist.  Neck: No stridor.  Cardiovascular: Normal rate, regular rhythm.  Good peripheral circulation. Respiratory: Normal respiratory effort without tachypnea or retractions. Lungs CTAB. Good air entry to the bases with no decreased or absent breath sounds. Gastrointestinal: Bowel sounds 4 quadrants. Soft and nontender to palpation. No guarding or rigidity. No palpable masses. No distention. No CVA tenderness. Musculoskeletal: Full range of motion to all extremities. No gross deformities appreciated. Tenderness to palpation over bilateral lumbar paraspinal muscles. Strength equal in lower extremities bilaterally. Negative straight leg raise. Neurologic:  Normal speech and language. No gross focal neurologic deficits are appreciated.   Skin:  Skin is warm, dry and intact. No rash noted. Psychiatric: Mood and affect are normal. Speech and behavior are normal. Patient exhibits appropriate insight and judgement.   ____________________________________________   LABS (all labs ordered are listed, but only abnormal results are displayed)  Labs Reviewed  URINALYSIS, ROUTINE W REFLEX MICROSCOPIC - Abnormal; Notable for the following components:      Result Value   Color, Urine YELLOW (*)    APPearance CLEAR (*)    Leukocytes, UA SMALL (*)    Bacteria, UA MANY (*)    All other components within normal limits  POCT PREGNANCY, URINE   ____________________________________________  EKG   ____________________________________________  RADIOLOGY   US Venous Img Lower Unilateral Left  Result Date: 04/10/2018 CLINICAL DATA:  57 year old with left leg pain and swelling. EXAM: LEFT LOWER EXTREMITY VENOUS DOPPLER ULTRASOUND TECHNIQUE: Gray-scale sonography with graded compression, as well as color Doppler and duplex ultrasound were performed to evaluate the lower extremity deep venous systems from the level of the common femoral vein and including the common femoral, femoral, profunda femoral, popliteal and calf veins including the posterior tibial, peroneal and gastrocnemius veins when visible. The superficial great saphenous vein was also interrogated. Spectral Doppler was utilized to evaluate flow at rest and with distal augmentation maneuvers in the common femoral, femoral and popliteal veins. COMPARISON:  None. FINDINGS: Contralateral Common Femoral Vein: Respiratory phasicity is normal and symmetric with the symptomatic side. No evidence of thrombus. Normal compressibility. Common Femoral Vein: No evidence of thrombus. Normal compressibility, respiratory phasicity and response to augmentation. Saphenofemoral Junction: No evidence of thrombus. Normal compressibility and flow on color Doppler imaging. Profunda Femoral Vein: No  evidence of thrombus. Normal compressibility and flow on color Doppler imaging. Femoral Vein: No evidence of thrombus. Normal compressibility, respiratory phasicity and response to augmentation. Popliteal Vein: No evidence of thrombus. Normal compressibility, respiratory phasicity and response to augmentation. Calf Veins: No evidence of thrombus. Normal compressibility and flow on color Doppler imaging. Other Findings:  None. IMPRESSION: Negative for deep venous thrombosis in left lower extremity. Electronically Signed   By: Markus Daft M.D.   On: 04/10/2018 17:00   Ct L-spine No Charge  Result Date: 04/10/2018 CLINICAL DATA:  Back pain radiating to the pelvic floor for months. History of kidney stones, degenerative spine, hysterectomy, cholecystectomy. EXAM: CT ABDOMEN AND PELVIS WITHOUT CONTRAST CT LUMBAR SPINE WITHOUT CONTRAST TECHNIQUE: Multidetector CT imaging of the abdomen and pelvis was performed following the standard protocol without IV contrast. CT reformations of the lumbar spine obtained. COMPARISON:  None. FINDINGS: Large body habitus results in overall noisy image quality. CT ABDOMEN AND PELVIS FINDINGS LOWER CHEST: Lung bases are clear. The visualized heart size is normal. No pericardial effusion. HEPATOBILIARY: Status post cholecystectomy. Mildly hypodense liver compatible with steatosis. PANCREAS: Normal. SPLEEN: Normal. ADRENALS/URINARY TRACT: Kidneys are orthotopic, demonstrating normal size and morphology. Fullness of the RIGHT renal collecting system without frank hydronephrosis. No nephrolithiasis; limited assessment for renal masses by nonenhanced CT. The unopacified ureters are normal in course and caliber. Urinary bladder is partially distended and unremarkable. Normal adrenal glands. STOMACH/BOWEL: The stomach, small and large bowel are normal in course and caliber without inflammatory changes, sensitivity decreased by lack of enteric contrast. Normal appendix. VASCULAR/LYMPHATIC:  Aortoiliac vessels are normal in course and caliber. Trace calcific atherosclerosis. No lymphadenopathy by CT size criteria. Phleboliths in the pelvis. REPRODUCTIVE: Normal.  Tubal ligation clips in situ. OTHER: No intraperitoneal free fluid or free air. MUSCULOSKELETAL: Non-acute. Moderate sacroiliac osteoarthrosis. Moderate thoracic spondylosis. CT LUMBAR SPINE FINDINGS SEGMENTATION: For the purposes of this report the last well-formed intervertebral disc space is reported as L5-S1. ALIGNMENT: Maintained lumbar lordosis. No malalignment. VERTEBRAE: Vertebral bodies and posterior elements are intact. Intervertebral disc heights preserved. No destructive bony lesions. PARASPINAL AND OTHER SOFT TISSUES: As above. DISC LEVELS: T12-L1 through L5-S1: No disc bulge, canal stenosis nor neural foraminal narrowing. Moderate L4-5 and L5-S1 facet arthropathy. IMPRESSION: CT abdomen and pelvis: 1. Mild fullness of the RIGHT renal collecting system seen with urinary tract infection or recently passed stone. No nephrolithiasis or frank hydronephrosis. 2. Hepatic steatosis. CT lumbar spine: 1. No fracture or malalignment. 2. No spondylosis.  Moderate L4-5 and L5-S1 facet arthropathy. Aortic Atherosclerosis (ICD10-I70.0). Electronically Signed   By: Elon Alas M.D.   On: 04/10/2018 17:36   Ct Renal Stone Study  Result Date: 04/10/2018 CLINICAL DATA:  Back pain radiating to the pelvic floor for months. History of kidney stones, degenerative spine, hysterectomy, cholecystectomy. EXAM: CT ABDOMEN AND PELVIS WITHOUT CONTRAST CT LUMBAR SPINE WITHOUT CONTRAST TECHNIQUE: Multidetector CT imaging of the abdomen and pelvis was performed following the standard protocol without IV contrast. CT reformations of the lumbar spine obtained. COMPARISON:  None. FINDINGS: Large body habitus results in overall noisy image quality. CT ABDOMEN AND PELVIS FINDINGS LOWER CHEST: Lung bases are clear. The visualized heart size is normal. No  pericardial effusion. HEPATOBILIARY: Status post cholecystectomy. Mildly hypodense liver compatible with steatosis. PANCREAS: Normal. SPLEEN: Normal. ADRENALS/URINARY TRACT: Kidneys are orthotopic, demonstrating normal size and morphology. Fullness of the RIGHT renal collecting system without frank hydronephrosis. No nephrolithiasis; limited assessment for renal masses by nonenhanced CT. The unopacified ureters are normal in course and caliber. Urinary bladder is partially distended and unremarkable. Normal adrenal glands. STOMACH/BOWEL: The stomach, small and large bowel are normal in course and caliber without inflammatory changes, sensitivity decreased by lack of enteric contrast. Normal appendix. VASCULAR/LYMPHATIC: Aortoiliac vessels are normal in course and caliber. Trace calcific atherosclerosis. No lymphadenopathy by CT size criteria. Phleboliths in the pelvis. REPRODUCTIVE: Normal.  Tubal ligation clips in situ. OTHER: No intraperitoneal free fluid or free air. MUSCULOSKELETAL: Non-acute. Moderate sacroiliac osteoarthrosis. Moderate thoracic spondylosis. CT LUMBAR SPINE FINDINGS SEGMENTATION: For the purposes of this report the last well-formed intervertebral disc space is reported as L5-S1. ALIGNMENT: Maintained lumbar lordosis. No malalignment. VERTEBRAE: Vertebral bodies and posterior elements are intact. Intervertebral disc heights preserved. No destructive bony lesions. PARASPINAL AND OTHER SOFT TISSUES: As above. DISC LEVELS: T12-L1 through L5-S1: No disc bulge, canal stenosis nor neural foraminal narrowing. Moderate L4-5 and L5-S1 facet arthropathy. IMPRESSION: CT abdomen and pelvis: 1. Mild fullness of the RIGHT renal collecting system seen with urinary tract infection or recently passed stone. No nephrolithiasis or frank hydronephrosis. 2. Hepatic steatosis. CT lumbar spine: 1. No fracture or malalignment. 2. No spondylosis.  Moderate L4-5 and L5-S1 facet arthropathy. Aortic Atherosclerosis  (ICD10-I70.0). Electronically Signed   By: Elon Alas M.D.   On: 04/10/2018 17:36    ____________________________________________    PROCEDURES  Procedure(s) performed:    Procedures    Medications - No data to display   ____________________________________________   INITIAL IMPRESSION / ASSESSMENT AND PLAN / ED COURSE  Pertinent labs & imaging results that were available during my care of the patient were reviewed by me and considered in my medical decision making (see chart for details).  Review of the [place] CSRS was performed in accordance of the [place] prior to dispensing any controlled drugs.   Patient's diagnosis is consistent with urinary tract infection and lumbar radiculopathy.  Vital signs and exam are reassuring.  CT renal stone study consistent with urinary tract infection or recently passed stone.  CT lumbar consistent with degenerative disc disease.  Ultrasound negative for DVT.  Patient will be discharged home with prescriptions for Macrobid, flexeril, lidoderm. Patient is to follow up with PCP as directed. Patient is given ED precautions to return to the ED for any worsening or new symptoms.     ____________________________________________  FINAL CLINICAL IMPRESSION(S) / ED DIAGNOSES  Final diagnoses:  Back pain  Acute cystitis without hematuria  Lumbar radiculopathy      NEW MEDICATIONS STARTED  DURING THIS VISIT:  ED Discharge Orders        Ordered    nitrofurantoin, macrocrystal-monohydrate, (MACROBID) 100 MG capsule  2 times daily     04/10/18 1801    cyclobenzaprine (FLEXERIL) 5 MG tablet     04/10/18 1801    lidocaine (LIDODERM) 5 %  Every 12 hours     04/10/18 1801          This chart was dictated using voice recognition software/Dragon. Despite best efforts to proofread, errors can occur which can change the meaning. Any change was purely unintentional.    Laban Emperor, PA-C 04/10/18 2341    Darel Hong, MD 04/11/18  1334

## 2018-04-14 ENCOUNTER — Ambulatory Visit: Payer: 59 | Admitting: Nurse Practitioner

## 2018-11-24 ENCOUNTER — Other Ambulatory Visit: Payer: Self-pay

## 2018-11-24 ENCOUNTER — Ambulatory Visit: Payer: 59 | Admitting: Nurse Practitioner

## 2018-11-24 ENCOUNTER — Encounter: Payer: Self-pay | Admitting: Nurse Practitioner

## 2018-11-24 VITALS — BP 154/58 | HR 60 | Temp 98.5°F | Ht 69.0 in | Wt 318.0 lb

## 2018-11-24 DIAGNOSIS — L249 Irritant contact dermatitis, unspecified cause: Secondary | ICD-10-CM

## 2018-11-24 DIAGNOSIS — B029 Zoster without complications: Secondary | ICD-10-CM

## 2018-11-24 MED ORDER — GABAPENTIN 100 MG PO CAPS: 200.0000 mg | ORAL_CAPSULE | Freq: Every day | ORAL | 0 refills | Status: DC

## 2018-11-24 MED ORDER — HYDROXYZINE HCL 10 MG PO TABS: 10.0000 mg | ORAL_TABLET | Freq: Three times a day (TID) | ORAL | 0 refills | Status: DC | PRN

## 2018-11-24 MED ORDER — VALACYCLOVIR HCL 1 G PO TABS: 1000.0000 mg | ORAL_TABLET | Freq: Three times a day (TID) | ORAL | 0 refills | Status: AC

## 2018-11-24 NOTE — Progress Notes (Signed)
Subjective:    Patient ID: Shannon Bryan, female    DOB: 10-07-1961, 58 y.o.   MRN: [id number]  Shannon Bryan is a 58 y.o. female presenting on 11/24/2018 for Herpes Zoster (the pt states she had a rash on the ride side of her face and neck, but it subsided. She also notice a rash under her Right breast and around the flank.)   HPI Rash Rash on face appeared about 3 days ago, took Vistaril and had good relief. This rash has not returned.  Patient was also having some mild itching of Right torso under breast/across abdomen.  This itching not improved with Vistaril.  This area also has pain with light touching, rubbing across feels like pricks, stinging, electrical-like impulses.  Then, patient had raised, pustular rash appeared 1-2 days ago.   - Patient has had chicken pox as child.  Social History   Tobacco Use  . Smoking status: Current Some Day Smoker    Packs/day: 0.10    Years: 30.00    Pack years: 3.00  . Smokeless tobacco: Former Systems developer  . Tobacco comment: 1/4 - 1/2 ppd had quit for 1 year in 2010  Substance Use Topics  . Alcohol use: Yes    Comment: occasional  . Drug use: No    Types: Cocaine, Marijuana    Review of Systems Per HPI unless specifically indicated above     Objective:    BP (!) 154/58 (BP Location: Left Arm, Patient Position: Sitting, Cuff Size: Large)   Pulse 60   Temp 98.5 F (36.9 C) (Oral)   Ht 5\' 9"  (1.753 m)   Wt (!) 318 lb (144.2 kg)   BMI 46.96 kg/m   Wt Readings from Last 3 Encounters:  11/24/18 (!) 318 lb (144.2 kg)  12/22/17 (!) 302 lb 9.6 oz (137.3 kg)  11/25/17 (!) 312 lb 12.8 oz (141.9 kg)    Physical Exam Vitals signs reviewed.  Constitutional:      General: She is not in acute distress.    Appearance: She is well-developed.  HENT:     Head: Normocephalic and atraumatic.  Skin:    General: Skin is warm and dry.       Neurological:     Mental Status: She is alert and oriented to person, place, and time.  Psychiatric:         Mood and Affect: Mood normal.        Behavior: Behavior normal.        Thought Content: Thought content normal.        Judgment: Judgment normal.    Results for orders placed or performed during the hospital encounter of 04/10/18  Urinalysis, Routine w reflex microscopic  Result Value Ref Range   Color, Urine YELLOW (A) YELLOW   APPearance CLEAR (A) CLEAR   Specific Gravity, Urine 1.005 1.005 - 1.030   pH 8.0 5.0 - 8.0   Glucose, UA NEGATIVE NEGATIVE mg/dL   Hgb urine dipstick NEGATIVE NEGATIVE   Bilirubin Urine NEGATIVE NEGATIVE   Ketones, ur NEGATIVE NEGATIVE mg/dL   Protein, ur NEGATIVE NEGATIVE mg/dL   Nitrite NEGATIVE NEGATIVE   Leukocytes, UA SMALL (A) NEGATIVE   RBC / HPF 0-5 0 - 5 RBC/hpf   WBC, UA 0-5 0 - 5 WBC/hpf   Bacteria, UA MANY (A) NONE SEEN   Squamous Epithelial / LPF 0-5 0 - 5  Pregnancy, urine POC  Result Value Ref Range   Preg Test, Ur NEGATIVE NEGATIVE  Assessment & Plan:   Problem List Items Addressed This Visit    None    Visit Diagnoses    Irritant contact dermatitis, unspecified trigger    -  Primary   Relevant Medications   hydrOXYzine (ATARAX/VISTARIL) 10 MG tablet   Herpes zoster without complication       Relevant Medications   valACYclovir (VALTREX) 1000 MG tablet   gabapentin (NEURONTIN) 100 MG capsule    # Irritant contact dermatitis likely initially over face/neck - Avoid irritant - May take hydroxyzine 10 mg tid prn itching - Follow-up prn if rash returns  #Clinically consistent with new acute shingles herpes zoster outbreak onset 2 days ago, with mild neuropathic pain. - Never received zoster vaccine - No evidence of complications, such as ocular involvement  Plan: 1. Start Valacyclovir 1000mg  TID with food x 10 days, counseled on treatment course and side effects. 2. Discussed additional neuropathic pain med options such as gabapentin, TCA, Tramadol PRN.  Due to itching, will start hydroxyzine as above and  gabapentin 100-200 mg at bedtime. 3. Counseling on infectious nature of problem with avoiding close contact with high risk populations.  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  Stay home if open lesions are present. 4. Follow-up as needed within 4-6 weeks if not resolving or if residual pain, strict return criteria if ocular involvement or other acute concerns.  IN 6 weeks may consider Shingrix vaccination for future prevention.  Meds ordered this encounter  Medications  . hydrOXYzine (ATARAX/VISTARIL) 10 MG tablet    Sig: Take 1 tablet (10 mg total) by mouth 3 (three) times daily as needed for itching.    Dispense:  30 tablet    Refill:  0    Order Specific Question:   Supervising Provider    Answer:   Olin Hauser [[id number]]  . valACYclovir (VALTREX) 1000 MG tablet    Sig: Take 1 tablet (1,000 mg total) by mouth 3 (three) times daily for 10 days.    Dispense:  30 tablet    Refill:  0    Order Specific Question:   Supervising Provider    Answer:   Olin Hauser [[id number]]  . gabapentin (NEURONTIN) 100 MG capsule    Sig: Take 2 capsules (200 mg total) by mouth at bedtime.    Dispense:  40 capsule    Refill:  0    Order Specific Question:   Supervising Provider    Answer:   Olin Hauser [[id number]]    Follow up plan: Return 5-7 days if symptoms worsen or fail to improve.  Cassell Smiles, DNP, AGPCNP-BC Adult Gerontology Primary Care Nurse Practitioner [facility] 11/24/2018, 3:05 PM

## 2018-11-24 NOTE — Patient Instructions (Addendum)
Shannon Bryan,   Thank you for coming in to clinic today.  1. Clinically consistent with new acute shingles herpes zoster outbreak onset 2 days ago, with moderate neuropathic pain. - START Valacyclovir 1000mg  TID with food x 10 days - START gabapentin 100-200 mg at bedtime - Remember our counseling about spreading infection: avoid close contact with high risk populations (immunocompromised (on chemo, immunosupressive meds), anyone who has never had chicken pox or the vaccine).  2. For your other rash, take an antihistamine (benadryl, or claritin/allegra/Zyrtec) - Start Vistaril 10 mg one tablet up to 3 times daily.  3. After well for 6 weeks I recommend you get Shingrix - Shingles vaccine: 2 doses 2-6 months apart.  http://www.wolf.info/ for more information if you have questions.  Please schedule a follow-up appointment with Cassell Smiles, AGNP. Return 5-7 days if symptoms worsen or fail to improve.  If you have any other questions or concerns, please feel free to call the clinic or send a message through [place]. You may also schedule an earlier appointment if necessary.  You will receive a survey after today's visit either digitally by e-mail or paper by [employer]. Your experiences and feedback matter to [place].  Please respond so we know how we are doing as we provide care for you.   Cassell Smiles, DNP, AGNP-BC Adult Gerontology Nurse Practitioner [place]

## 2018-12-25 ENCOUNTER — Other Ambulatory Visit: Payer: Self-pay | Admitting: Nurse Practitioner

## 2018-12-25 DIAGNOSIS — I10 Essential (primary) hypertension: Secondary | ICD-10-CM

## 2019-01-04 ENCOUNTER — Encounter: Payer: Self-pay | Admitting: Nurse Practitioner

## 2019-01-04 ENCOUNTER — Ambulatory Visit: Payer: Self-pay | Admitting: Nurse Practitioner

## 2019-01-04 VITALS — BP 157/82 | HR 67 | Temp 98.4°F | Resp 16 | Ht 69.0 in | Wt 320.6 lb

## 2019-01-04 DIAGNOSIS — F5104 Psychophysiologic insomnia: Secondary | ICD-10-CM

## 2019-01-04 DIAGNOSIS — Z23 Encounter for immunization: Secondary | ICD-10-CM

## 2019-01-04 DIAGNOSIS — R7309 Other abnormal glucose: Secondary | ICD-10-CM

## 2019-01-04 DIAGNOSIS — R079 Chest pain, unspecified: Secondary | ICD-10-CM

## 2019-01-04 MED ORDER — TRAZODONE HCL 50 MG PO TABS: 25.0000 mg | ORAL_TABLET | Freq: Every evening | ORAL | 5 refills | Status: DC | PRN

## 2019-01-04 NOTE — Patient Instructions (Signed)
Shannon Bryan,   Thank you for coming in to clinic today.  1. Referral to Cardiology for further heart eval.  2. START Trazodone 50 mg tablet.  Take 1/2 - 1 tablet once daily at bedtime.    3. You will be due for FASTING BLOOD WORK.  This means you should eat no food or drink after midnight.  Drink only water or coffee without cream/sugar on the morning of your lab visit. - Please go ahead and schedule a "Lab Only" visit in the morning at the clinic for lab draw in the next 7 days. - Your results will be available about 2-3 days after blood draw.  If you have set up a MyChart account, you can can log in to MyChart online to view your results and a brief explanation. Also, we can discuss your results together at your next office visit if you would like. - Come to clinic between 8-11:30 am for your labs.  4. Sleep Hygiene Tips  Take medicines only as directed by your health care provider.  Keep regular sleeping and waking hours. Avoid naps.  Keep a sleep diary to help you and your health care provider figure out what could be causing your insomnia. Include:  When you sleep.  When you wake up during the night.  How well you sleep.  How rested you feel the next day.  Any side effects of medicines you are taking.  What you eat and drink.  Make your bedroom a comfortable place where it is easy to fall asleep:  Put up shades or special blackout curtains to block light from outside.  Use a white noise machine to block noise.  Keep the temperature cool.  Exercise regularly as directed by your health care provider. Avoid exercising right before bedtime.  Use relaxation techniques to manage stress. Ask your health care provider to suggest some techniques that may work well for you. These may include:  Breathing exercises.  Routines to release muscle tension.  Visualizing peaceful scenes.  Cut back on alcohol, caffeinated beverages, and cigarettes, especially close to  bedtime. These can disrupt your sleep.  Do not overeat or eat spicy foods right before bedtime. This can lead to digestive discomfort that can make it hard for you to sleep.  Limit screen use before bedtime. This includes:  Watching TV.  Using your smartphone, tablet, and computer.  Stick to a routine. This can help you fall asleep faster. Try to do a quiet activity, brush your teeth, and go to bed at the same time each night.  Get out of bed if you are still awake after 15 minutes of trying to sleep. Keep the lights down, but try reading or doing a quiet activity. When you feel sleepy, go back to bed.  Make sure that you drive carefully. Avoid driving if you feel very sleepy.  Keep all follow-up appointments as directed by your health care provider. This is important.  Please schedule a follow-up appointment with Cassell Smiles, AGNP. No follow-ups on file.  If you have any other questions or concerns, please feel free to call the clinic or send a message through [facility]. You may also schedule an earlier appointment if necessary.  You will receive a survey after today's visit either digitally by e-mail or paper by [employer]. Your experiences and feedback matter to [place].  Please respond so we know how we are doing as we provide care for you.   Cassell Smiles, DNP, AGNP-BC Adult Gerontology Nurse  Practitioner [place]

## 2019-01-04 NOTE — Progress Notes (Signed)
Subjective:    Patient ID: Shannon Bryan, female    DOB: 1961/01/31, 58 y.o.   MRN: [id number]  Shannon Bryan is a 58 y.o. female presenting on 01/04/2019 for Abdominal Pain (intermittent pain radiate from her back when heart is beating fast x 2. Symptoms worsen when with movement and when she gets emotional.)   HPI Abdominal, epigastric/substernal chest pain - Radiates from back around to her stomach/chest near epigastric area when she has rapid heart rate.   - Symptoms seem to worsen with movement and when she "gets emotional."  This is best described as being mad.  She also walks when mad.  When she calms herself down, symptoms resolve.  - Patient got out of the care and feels that she was instantly feeling pain wrap around from back to her stomach.  Heart rate is increasing or if walking up stairs in a hurry, talking when walking up stairs.  Then has to sit down.  Pain improves once she rests.   - Patient has had these symptoms off and on for over 1 year.  Now it is happening more often.   - Fastest patient has seen for HR is around 115.  Patient occasionally feels flutters, flushed feeling, and skipped beats. - No change in pain with eating.  Nothing other than rest helps resolve her pain.  Insomnia Also doesn't sleep well.  Lays in bed for hours.  Tylenol Pm has not been helpful in past. Gets sleepy 5:30-6a and sleeps to 11:30am. Wakes easily to noises.  Always keeps same schedule.  Social History   Tobacco Use  . Smoking status: Current Some Day Smoker    Packs/day: 0.10    Years: 30.00    Pack years: 3.00  . Smokeless tobacco: Former Systems developer  . Tobacco comment: 1/4 - 1/2 ppd had quit for 1 year in 2010  Substance Use Topics  . Alcohol use: Yes    Comment: occasional  . Drug use: No    Types: Cocaine, Marijuana    Review of Systems Per HPI unless specifically indicated above     Objective:    BP (!) 157/82   Pulse 67   Temp 98.4 F (36.9 C) (Oral)   Resp 16    Ht 5\' 9"  (1.753 m)   Wt (!) 320 lb 9.6 oz (145.4 kg)   SpO2 100%   BMI 47.34 kg/m   Wt Readings from Last 3 Encounters:  01/04/19 (!) 320 lb 9.6 oz (145.4 kg)  11/24/18 (!) 318 lb (144.2 kg)  12/22/17 (!) 302 lb 9.6 oz (137.3 kg)    Physical Exam Vitals signs reviewed.  Constitutional:      General: She is awake. She is not in acute distress.    Appearance: She is well-developed.  HENT:     Head: Normocephalic and atraumatic.  Neck:     Musculoskeletal: Normal range of motion and neck supple.     Vascular: No carotid bruit.  Cardiovascular:     Rate and Rhythm: Normal rate and regular rhythm.     Pulses:          Radial pulses are 2+ on the right side and 2+ on the left side.       Posterior tibial pulses are 1+ on the right side and 1+ on the left side.     Heart sounds: Normal heart sounds, S1 normal and S2 normal.  Pulmonary:     Effort: Pulmonary effort is normal. No respiratory distress.  Breath sounds: Normal breath sounds and air entry.  Musculoskeletal:     Comments: Thoracic and Low Back Inspection: Normal appearance, Large body habitus, no spinal deformity, symmetrical. Palpation: No tenderness over spinous processes. Left sided thoracic and lumbar paraspinal muscles  tender and with hypertonicity/spasm. ROM: Full active ROM forward flex / back extension, rotation L/R without discomfort Special Testing: Seated SLR with reproduced localized R back pain but negative for radicular pain. Standing facet load test negative Strength: Bilateral hip flex/ext 5/5, knee flex/ext 5/5, ankle dorsiflex/plantarflex 5/5 Neurovascular: intact distal sensation to light touch   Skin:    General: Skin is warm and dry.  Neurological:     Mental Status: She is alert and oriented to person, place, and time.  Psychiatric:        Attention and Perception: Attention normal.        Mood and Affect: Mood and affect normal.        Behavior: Behavior normal. Behavior is cooperative.        Results for orders placed or performed during the hospital encounter of 04/10/18  Urinalysis, Routine w reflex microscopic  Result Value Ref Range   Color, Urine YELLOW (A) YELLOW   APPearance CLEAR (A) CLEAR   Specific Gravity, Urine 1.005 1.005 - 1.030   pH 8.0 5.0 - 8.0   Glucose, UA NEGATIVE NEGATIVE mg/dL   Hgb urine dipstick NEGATIVE NEGATIVE   Bilirubin Urine NEGATIVE NEGATIVE   Ketones, ur NEGATIVE NEGATIVE mg/dL   Protein, ur NEGATIVE NEGATIVE mg/dL   Nitrite NEGATIVE NEGATIVE   Leukocytes, UA SMALL (A) NEGATIVE   RBC / HPF 0-5 0 - 5 RBC/hpf   WBC, UA 0-5 0 - 5 WBC/hpf   Bacteria, UA MANY (A) NONE SEEN   Squamous Epithelial / LPF 0-5 0 - 5  Pregnancy, urine POC  Result Value Ref Range   Preg Test, Ur NEGATIVE NEGATIVE   01/05/19 EKG: NSR with/ RBBB Lead aVF only   HR 62 bpm PR 200 ms QRS 88 ms     QT 470ms   QTc 460ms Axis deviation: none      Assessment & Plan:   Problem List Items Addressed This Visit    None    Visit Diagnoses    Chest pain, exertional    -  Primary Exertional substernal/epigastric pain with radiation from back intermittently over last 2 months.  Concern for stable angina without prior history of CAD/MI.  Differential dx includes panic attack, GERD.  Plan: 1. Recommend cardiac workup  2. EKG in clinic normal today. 3. Labs today 4. Follow-up if worsening.  Reviewed emergency signs and symptoms with patient.    Relevant Orders   Ambulatory referral to Cardiology   TSH (Completed)   COMPLETE METABOLIC PANEL WITH GFR (Completed)   Magnesium (Completed)   CBC with Differential/Platelet (Completed)   EKG 12-Lead   Psychophysiological insomnia     Uncontrolled today on exam.  Medications not yet started.  START trazodone 50 mg tab.  Take 1/2 - 1 tab at bedtime prn insomnia  .  Refills provided.  Check labs today. Followup 2-4 weeks prn.    Relevant Medications   traZODone (DESYREL) 50 MG tablet   Other Relevant Orders   Lipid panel    Need for shingles vaccine       Relevant Orders   Varicella-zoster vaccine IM (Shingrix) (Completed)      Meds ordered this encounter  Medications  . traZODone (DESYREL) 50 MG tablet  Sig: Take 0.5-1 tablets (25-50 mg total) by mouth at bedtime as needed for sleep.    Dispense:  30 tablet    Refill:  5    Order Specific Question:   Supervising Provider    Answer:   Olin Hauser [[id number]]    Follow up plan: Return 2-4 weeks if symptoms worsen or fail to improve.  Cassell Smiles, DNP, AGPCNP-BC Adult Gerontology Primary Care Nurse Practitioner [facility] 01/04/2019, 2:02 PM

## 2019-01-06 ENCOUNTER — Encounter: Payer: Self-pay | Admitting: Nurse Practitioner

## 2019-01-06 DIAGNOSIS — R7303 Prediabetes: Secondary | ICD-10-CM | POA: Insufficient documentation

## 2019-01-08 ENCOUNTER — Telehealth: Payer: Self-pay

## 2019-01-08 DIAGNOSIS — R059 Cough, unspecified: Secondary | ICD-10-CM

## 2019-01-08 DIAGNOSIS — R05 Cough: Secondary | ICD-10-CM

## 2019-01-08 MED ORDER — BENZONATATE 100 MG PO CAPS: 100.0000 mg | ORAL_CAPSULE | Freq: Two times a day (BID) | ORAL | 0 refills | Status: DC | PRN

## 2019-01-08 NOTE — Telephone Encounter (Signed)
Patient should also start otc antihistamine Claritin, Zyrtec, or Allegra or generics for itchiness.  This is likely allergic response to environmental allergy or virus.  Antihistamine will reduce mucous production for either.

## 2019-01-08 NOTE — Telephone Encounter (Signed)
I contacted the pt to notify her of her lab results. She complaining of a cough, watery eyes and hoarseness x 3 days. The pt is requesting that you call her in some Tessalon Pearles for the cough. Please advise

## 2019-01-08 NOTE — Telephone Encounter (Signed)
The pt was notified. She verbalize understanding, no questions or concerns. 

## 2019-01-09 LAB — MAGNESIUM: Magnesium: 2.2 mg/dL (ref 1.5–2.5)

## 2019-01-09 LAB — COMPLETE METABOLIC PANEL WITH GFR
AG Ratio: 1.4 (calc) (ref 1.0–2.5)
ALT: 19 U/L (ref 6–29)
AST: 19 U/L (ref 10–35)
Albumin: 3.8 g/dL (ref 3.6–5.1)
Alkaline phosphatase (APISO): 69 U/L (ref 37–153)
BUN: 11 mg/dL (ref 7–25)
CO2: 27 mmol/L (ref 20–32)
Calcium: 9.4 mg/dL (ref 8.6–10.4)
Chloride: 105 mmol/L (ref 98–110)
Creat: 0.92 mg/dL (ref 0.50–1.05)
GFR, Est African American: 81 mL/min/{1.73_m2} (ref >=60)
GFR, Est Non African American: 70 mL/min/{1.73_m2} (ref >=60)
Globulin: 2.8 g/dL (calc) (ref 1.9–3.7)
Glucose, Bld: 118 mg/dL — ABNORMAL HIGH (ref 65–99)
Potassium: 3.9 mmol/L (ref 3.5–5.3)
Sodium: 141 mmol/L (ref 135–146)
Total Bilirubin: 0.4 mg/dL (ref 0.2–1.2)
Total Protein: 6.6 g/dL (ref 6.1–8.1)

## 2019-01-09 LAB — CBC WITH DIFFERENTIAL/PLATELET
Absolute Monocytes: 484 cells/uL (ref 200–950)
Basophils Absolute: 18 cells/uL (ref 0–200)
Basophils Relative: 0.3 %
Eosinophils Absolute: 89 cells/uL (ref 15–500)
Eosinophils Relative: 1.5 %
HCT: 39.9 % (ref 35.0–45.0)
Hemoglobin: 13.6 g/dL (ref 11.7–15.5)
Lymphs Abs: 1558 cells/uL (ref 850–3900)
MCH: 27.6 pg (ref 27.0–33.0)
MCHC: 34.1 g/dL (ref 32.0–36.0)
MCV: 81.1 fL (ref 80.0–100.0)
MPV: 10.7 fL (ref 7.5–12.5)
Monocytes Relative: 8.2 %
Neutro Abs: 3752 cells/uL (ref 1500–7800)
Neutrophils Relative %: 63.6 %
Platelets: 289 10*3/uL (ref 140–400)
RBC: 4.92 10*6/uL (ref 3.80–5.10)
RDW: 14.7 % (ref 11.0–15.0)
Total Lymphocyte: 26.4 %
WBC: 5.9 10*3/uL (ref 3.8–10.8)

## 2019-01-09 LAB — HEMOGLOBIN A1C W/OUT EAG: Hgb A1c MFr Bld: 6.3 % of total Hgb — ABNORMAL HIGH (ref <5.7)

## 2019-01-09 LAB — TSH: TSH: 4.45 mIU/L

## 2019-01-10 ENCOUNTER — Telehealth: Payer: Self-pay

## 2019-01-10 NOTE — Telephone Encounter (Signed)
The pt was notified about her A1C lab results and the need to come back in 3 mth to get her A1C rechecked. She verbalize understanding, no questions or concerns.

## 2019-01-11 ENCOUNTER — Encounter: Payer: Self-pay | Admitting: Nurse Practitioner

## 2019-01-18 ENCOUNTER — Telehealth: Payer: Self-pay | Admitting: *Deleted

## 2019-01-18 NOTE — Telephone Encounter (Signed)
   Cardiac Questionnaire:    Since your last visit or hospitalization:    1. Have you been having new or worsening chest pain? Yes, not new but reason for visit.    2. Have you been having new or worsening shortness of breath? No 3. Have you been having new or worsening leg swelling, wt gain, or increase in abdominal girth (pants fitting more tightly)? No   4. Have you had any passing out spells? No    _____________   [id number] Pre-Screening Questions:  . Do you currently have a fever? No  . Have you recently travelled on a cruise, internationally, or to [place], [place], [place], [place], [place], or [place], [place] [employer]) ? No  . Have you been in contact with someone that is currently pending confirmation of Covid19 testing or has been confirmed to have the [place] virus?  No  . Are you currently experiencing fatigue or cough? No

## 2019-01-18 NOTE — Progress Notes (Signed)
Cardiology Office Note   Date:  01/19/2019   ID:  Shannon Bryan, Shannon Bryan 02-02-1961, MRN [id number]  PCP:  Mikey College, NP  Cardiologist: Kathlyn Sacramento, MD  Chief Complaint  Patient presents with  . other    Ref by Betsy Coder, NP for exertional chest pain and shortness of breath. Meds reviewed by the pt. verbally. Pt. c/o pounding in chest with pain that starts in her back that radiates into her chest and up her neck. Symtoms for about two months with getting worse within the past couple of weeks.       History of Present Illness: Shannon Bryan is a 58 y.o. female who presents for an evaluation of exertional chest pain with no prior cardiac history. She is followd by Cassell Smiles, NP for hypertension. January 2019, patient's BP reading showed SBP's ranging from 180-206. She is currently taking HCTZ 25 mg, which she has been tolerating well without side effects. She reports prolonged history of hypertension in addition to tobacco use and obesity.  She also has mild hyperlipidemia.  She is to smoke 2 packs/day but she cut down to 3 to 4 cigarettes a day and wants to quit smoking.  Family history is remarkable for hypertension but no coronary artery disease. She reports prolonged symptoms of exertional dyspnea and palpitations of at least 1 year duration but this has gradually worsened over the last few months.  She describes some discomfort in her back between shoulder blades radiating to the chest area.  Symptoms resolved with rest.  No orthopnea or PND.  She has mild bilateral leg edema.  No recent cardiac work-up. She works at [employer].    Past Medical History:  Diagnosis Date  . Asthma   . Blood clot in vein   . Chronic kidney disease   . Hyperlipidemia   . Hypertension   . Kidney stone     Past Surgical History:  Procedure Laterality Date  . ABDOMINAL HYSTERECTOMY    . CHOLECYSTECTOMY    . ROTATOR CUFF REPAIR Right   . TUBAL LIGATION        Current Outpatient Medications  Medication Sig Dispense Refill  . acetaminophen (TYLENOL) 500 MG tablet Take 500 mg by mouth every 6 (six) hours as needed.    Marland Kitchen aspirin EC 81 MG tablet Take 81 mg by mouth daily.    . baclofen (LIORESAL) 10 MG tablet Take 1 tablet (10 mg total) by mouth 3 (three) times daily. 60 each 0  . benzonatate (TESSALON) 100 MG capsule Take 1 capsule (100 mg total) by mouth 2 (two) times daily as needed for cough. 20 capsule 0  . cyclobenzaprine (FLEXERIL) 5 MG tablet Take 1-2 tablets 3 times daily as needed 20 tablet 0  . gabapentin (NEURONTIN) 100 MG capsule Take 2 capsules (200 mg total) by mouth at bedtime. 40 capsule 0  . hydrochlorothiazide (HYDRODIURIL) 25 MG tablet TAKE 1 TABLET BY MOUTH ONCE DAILY 30 tablet 0  . hydrOXYzine (ATARAX/VISTARIL) 10 MG tablet Take 1 tablet (10 mg total) by mouth 3 (three) times daily as needed for itching. 30 tablet 0  . ibuprofen (ADVIL,MOTRIN) 800 MG tablet Take 1 tablet (800 mg total) by mouth every 8 (eight) hours as needed. 30 tablet 0  . ipratropium (ATROVENT) 0.06 % nasal spray Place 2 sprays into both nostrils 4 (four) times daily. 15 mL 0  . lidocaine (LIDODERM) 5 % Place 1 patch onto the skin every 12 (twelve) hours. Remove &  Discard patch within 12 hours or as directed by MD 10 patch 0  . Multiple Vitamins-Minerals (ONE-A-DAY 50 PLUS PO) Take 1 tablet by mouth daily.    . traZODone (DESYREL) 50 MG tablet Take 0.5-1 tablets (25-50 mg total) by mouth at bedtime as needed for sleep. 30 tablet 5  . carvedilol (COREG) 6.25 MG tablet Take 1 tablet (6.25 mg total) by mouth 2 (two) times daily. 180 tablet 3   No current facility-administered medications for this visit.     Allergies:   Bactrim [sulfamethoxazole-trimethoprim]; Penicillins; and Cleocin [clindamycin hcl]    Social History:  The patient  reports that she has been smoking. She has a 7.50 pack-year smoking history. She has quit using smokeless tobacco. She reports  current alcohol use. She reports that she does not use drugs.   Family History:  The patient's family history includes Cancer in her mother; Depression in her mother; Diabetes in her mother; Emphysema in her father; Heart disease in her mother; Stroke in her mother.    ROS:  Please see the history of present illness.   Otherwise, review of systems are positive for none.   All other systems are reviewed and negative.    PHYSICAL EXAM: VS:  BP (!) 144/90 (BP Location: Right Arm, Patient Position: Sitting, Cuff Size: Large)   Pulse 71   Ht 5\' 9"  (1.753 m)   Wt (!) 320 lb 12 oz (145.5 kg)   SpO2 99%   BMI 47.37 kg/m  , BMI Body mass index is 47.37 kg/m. GEN: Well nourished, well developed, in no acute distress HEENT: normal Neck: no JVD, carotid bruits, or masses Cardiac: RRR; no murmurs, rubs, or gallops, trace edema  Respiratory:  clear to auscultation bilaterally, normal work of breathing GI: soft, nontender, nondistended, + BS MS: no deformity or atrophy Skin: warm and dry, no rash Neuro:  Strength and sensation are intact Psych: euthymic mood, full affect   EKG:  EKG is ordered today. The ekg ordered today demonstrates normal sinus rhythm with low voltage and no significant ST or T wave changes.   Recent Labs: 01/05/2019: ALT 19; BUN 11; Creat 0.92; Hemoglobin 13.6; Magnesium 2.2; Platelets 289; Potassium 3.9; Sodium 141; TSH 4.45    Lipid Panel    Component Value Date/Time   CHOL 176 03/29/2017 0833   TRIG 99 03/29/2017 0833   HDL 42 (L) 03/29/2017 0833   CHOLHDL 4.2 03/29/2017 0833   VLDL 20 03/29/2017 0833   LDLCALC 114 (H) 03/29/2017 0833      Wt Readings from Last 3 Encounters:  01/19/19 (!) 320 lb 12 oz (145.5 kg)  01/04/19 (!) 320 lb 9.6 oz (145.4 kg)  11/24/18 (!) 318 lb (144.2 kg)       ASSESSMENT AND PLAN:  1.  Exertional dyspnea and chest pain: Symptoms could be due to underlying obstructive coronary artery disease.  However, other possibilities  include demand ischemia in the setting of uncontrolled hypertension and significant physical deconditioning. Given that she had the symptoms for a while, I do not think it is urgent to evaluate her symptoms at the present time given the current situation.  She is already on low-dose aspirin.  We should try to control her blood pressure and reevaluate her symptoms. We can consider echocardiogram and Lexiscan Myoview in few months. I elected to add carvedilol 6.25 mg twice daily.  2.  Essential hypertension: Blood pressure is not controlled.  Carvedilol was added as outlined above.  3.  Mild  hyperlipidemia: if coronary artery disease is found, treatment with a statin would be indicated.  4.  Tobacco use: She cut down significantly.  I discussed with her the importance of complete smoking cessation.    Disposition:   FU with me in 2 months  I, Heide Scales am acting as a Education administrator for Kathlyn Sacramento, M.D.   I have reviewed the above documentation for accuracy and completeness, and I agree with the above.   Signed, Kathlyn Sacramento, MD 01/19/19 [place], [place]

## 2019-01-19 ENCOUNTER — Other Ambulatory Visit: Payer: Self-pay

## 2019-01-19 ENCOUNTER — Ambulatory Visit (INDEPENDENT_AMBULATORY_CARE_PROVIDER_SITE_OTHER): Payer: PRIVATE HEALTH INSURANCE | Admitting: Cardiovascular Disease

## 2019-01-19 ENCOUNTER — Encounter: Payer: Self-pay | Admitting: Cardiovascular Disease

## 2019-01-19 VITALS — BP 144/90 | HR 71 | Ht 69.0 in | Wt 320.8 lb

## 2019-01-19 DIAGNOSIS — I10 Essential (primary) hypertension: Secondary | ICD-10-CM

## 2019-01-19 DIAGNOSIS — E785 Hyperlipidemia, unspecified: Secondary | ICD-10-CM | POA: Diagnosis not present

## 2019-01-19 DIAGNOSIS — Z720 Tobacco use: Secondary | ICD-10-CM | POA: Diagnosis not present

## 2019-01-19 DIAGNOSIS — R079 Chest pain, unspecified: Secondary | ICD-10-CM

## 2019-01-19 DIAGNOSIS — R0609 Other forms of dyspnea: Secondary | ICD-10-CM

## 2019-01-19 MED ORDER — CARVEDILOL 6.25 MG PO TABS: 6.2500 mg | ORAL_TABLET | Freq: Two times a day (BID) | ORAL | 3 refills | Status: DC

## 2019-01-19 NOTE — Patient Instructions (Signed)
Medication Instructions:  START Carvedilol 6.25 mg twice daily  If you need a refill on your cardiac medications before your next appointment, please call your pharmacy.   Lab work: None ordered  Testing/Procedures: None ordered  Follow-Up: At [facility], you and your health needs are our priority.  As part of our continuing mission to provide you with exceptional heart care, we have created designated Provider Care Teams.  These Care Teams include your primary Cardiologist (physician) and Advanced Practice Providers (APPs -  Physician Assistants and Nurse Practitioners) who all work together to provide you with the care you need, when you need it. You will need a follow up appointment in 2 months. You may see Kathlyn Sacramento, MD or one of the following Advanced Practice Providers on your designated Care Team:   Murray Hodgkins, NP Christell Faith, PA-C . Marrianne Mood, PA-C

## 2019-02-03 ENCOUNTER — Other Ambulatory Visit: Payer: Self-pay | Admitting: Nurse Practitioner

## 2019-02-03 DIAGNOSIS — I10 Essential (primary) hypertension: Secondary | ICD-10-CM

## 2019-03-21 ENCOUNTER — Telehealth: Payer: Self-pay

## 2019-03-21 NOTE — Telephone Encounter (Signed)

## 2019-03-23 ENCOUNTER — Telehealth (INDEPENDENT_AMBULATORY_CARE_PROVIDER_SITE_OTHER): Payer: PRIVATE HEALTH INSURANCE | Admitting: Cardiovascular Disease

## 2019-03-23 ENCOUNTER — Other Ambulatory Visit: Payer: Self-pay

## 2019-03-23 ENCOUNTER — Encounter: Payer: Self-pay | Admitting: Cardiovascular Disease

## 2019-03-23 VITALS — BP 148/82 | HR 69 | Ht 69.0 in | Wt 320.0 lb

## 2019-03-23 DIAGNOSIS — R079 Chest pain, unspecified: Secondary | ICD-10-CM | POA: Diagnosis not present

## 2019-03-23 DIAGNOSIS — R0602 Shortness of breath: Secondary | ICD-10-CM

## 2019-03-23 DIAGNOSIS — R0600 Dyspnea, unspecified: Secondary | ICD-10-CM

## 2019-03-23 DIAGNOSIS — E785 Hyperlipidemia, unspecified: Secondary | ICD-10-CM | POA: Diagnosis not present

## 2019-03-23 DIAGNOSIS — Z720 Tobacco use: Secondary | ICD-10-CM

## 2019-03-23 DIAGNOSIS — I10 Essential (primary) hypertension: Secondary | ICD-10-CM | POA: Diagnosis not present

## 2019-03-23 MED ORDER — AMLODIPINE BESYLATE 5 MG PO TABS: 5.0000 mg | ORAL_TABLET | Freq: Every day | ORAL | 2 refills | Status: DC

## 2019-03-23 NOTE — Patient Instructions (Addendum)
Medication Instructions:  START Amlodipine 5 mg once daily  If you need a refill on your cardiac medications before your next appointment, please call your pharmacy.   Lab work: None ordered  Testing/Procedures: Your physician has requested that you have an echocardiogram. Echocardiography is a painless test that uses sound waves to create images of your heart. It provides your doctor with information about the size and shape of your heart and how well your heart's chambers and valves are working. You may receive an ultrasound enhancing agent through an IV if needed to better visualize your heart during the echo.This procedure takes approximately one hour. There are no restrictions for this procedure. This will take place at the [facility] clinic.   Your physician has requested that you have a lexiscan myoview. For further information please visit [email or web address]. Please follow instruction sheet, as given.   Follow-Up: At [facility], you and your health needs are our priority.  As part of our continuing mission to provide you with exceptional heart care, we have created designated Provider Care Teams.  These Care Teams include your primary Cardiologist (physician) and Advanced Practice Providers (APPs -  Physician Assistants and Nurse Practitioners) who all work together to provide you with the care you need, when you need it. You will need a follow up appointment in 2 months after the ECHO and Lexiscan Myoview. You may see Kathlyn Sacramento, MD or one of the following Advanced Practice Providers on your designated Care Team:   Murray Hodgkins, NP Christell Faith, PA-C . Marrianne Mood, PA-C  Any Other Special Instructions Will Be Listed Below (If Applicable).  [place]  Your provider has ordered a Stress Test with nuclear imaging. The purpose of this test is to evaluate the blood supply to your heart muscle. This procedure is referred to as a "Non-Invasive Stress Test."  This is because other than having an IV started in your vein, nothing is inserted or "invades" your body. Cardiac stress tests are done to find areas of poor blood flow to the heart by determining the extent of coronary artery disease (CAD). Some patients exercise on a treadmill, which naturally increases the blood flow to your heart, while others who are unable to walk on a treadmill due to physical limitations have a pharmacologic/chemical stress agent called Lexiscan . This medicine will mimic walking on a treadmill by temporarily increasing your coronary blood flow.   Please note: these test may take anywhere between 2-4 hours to complete  PLEASE REPORT TO [facility] AT THE FIRST DESK WILL DIRECT YOU WHERE TO GO  Date of Procedure:_____________________________________  Arrival Time for Procedure:______________________________  Instructions regarding medication:  None to hold  PLEASE NOTIFY THE OFFICE AT LEAST 24 HOURS IN ADVANCE IF YOU ARE UNABLE TO KEEP YOUR APPOINTMENT.  [phone] AND  PLEASE NOTIFY NUCLEAR MEDICINE AT [facility] AT LEAST 24 HOURS IN ADVANCE IF YOU ARE UNABLE TO KEEP YOUR APPOINTMENT. [phone]  How to prepare for your Myoview test:  1. Do not eat or drink after midnight 2. No caffeine for 24 hours prior to test 3. No smoking 24 hours prior to test. 4. Your medication may be taken with water.  If your doctor stopped a medication because of this test, do not take that medication. 5. Ladies, please do not wear dresses.  Skirts or pants are appropriate. Please wear a short sleeve shirt. 6. No perfume, cologne or lotion. 7. Wear comfortable walking shoes. No heels!

## 2019-03-23 NOTE — Progress Notes (Signed)
Virtual Visit via Video Note   This visit type was conducted due to national recommendations for restrictions regarding the COVID-19 Pandemic (e.g. social distancing) in an effort to limit this patient's exposure and mitigate transmission in our community.  Due to her co-morbid illnesses, this patient is at least at moderate risk for complications without adequate follow up.  This format is felt to be most appropriate for this patient at this time.  All issues noted in this document were discussed and addressed.  A limited physical exam was performed with this format.  Please refer to the patient's chart for her consent to telehealth for [facility].   Date:  03/23/2019   ID:  Shannon, Bryan 11-09-1960, MRN [id number]  Patient Location: Other:  work Provider Location: Office  PCP:  Shannon College, NP  Cardiologist:  No primary care provider on file.  Electrophysiologist:  None   Evaluation Performed:  Follow-Up Visit  Chief Complaint: Chest pain and shortness of breath  History of Present Illness:    Shannon Bryan is a 58 y.o. female who was seen via video visit for follow-up regarding exertional chest pain and shortness of breath. She has known history of essential hypertension, mild hyperlipidemia, tobacco use and obesity. She was seen in March for worsening exertional dyspnea and chest pain in the setting of uncontrolled hypertension with blood pressure as high as 970 systolic.  She was on hydrochlorothiazide 25 mg once daily.  I added carvedilol 6.25 mg twice daily. She reports improvement in her blood pressure since then and some improvement in her chest pain and shortness of breath but these have have not resolved.   The patient does not have symptoms concerning for COVID-19 infection (fever, chills, cough, or new shortness of breath).    Past Medical History:  Diagnosis Date  . Asthma   . Blood clot in vein   . Chronic kidney disease   . Hyperlipidemia    . Hypertension   . Kidney stone    Past Surgical History:  Procedure Laterality Date  . ABDOMINAL HYSTERECTOMY    . CHOLECYSTECTOMY    . ROTATOR CUFF REPAIR Right   . TUBAL LIGATION       Current Meds  Medication Sig  . acetaminophen (TYLENOL) 500 MG tablet Take 500 mg by mouth every 6 (six) hours as needed.  Marland Kitchen aspirin EC 81 MG tablet Take 81 mg by mouth daily.  . baclofen (LIORESAL) 10 MG tablet Take 1 tablet (10 mg total) by mouth 3 (three) times daily.  . benzonatate (TESSALON) 100 MG capsule Take 1 capsule (100 mg total) by mouth 2 (two) times daily as needed for cough.  . carvedilol (COREG) 6.25 MG tablet Take 1 tablet (6.25 mg total) by mouth 2 (two) times daily.  . cyclobenzaprine (FLEXERIL) 5 MG tablet Take 1-2 tablets 3 times daily as needed  . gabapentin (NEURONTIN) 100 MG capsule Take 2 capsules (200 mg total) by mouth at bedtime.  . hydrochlorothiazide (HYDRODIURIL) 25 MG tablet Take 1 tablet (25 mg total) by mouth daily.  . hydrOXYzine (ATARAX/VISTARIL) 10 MG tablet Take 1 tablet (10 mg total) by mouth 3 (three) times daily as needed for itching.  Marland Kitchen ibuprofen (ADVIL,MOTRIN) 800 MG tablet Take 1 tablet (800 mg total) by mouth every 8 (eight) hours as needed.  Marland Kitchen ipratropium (ATROVENT) 0.06 % nasal spray Place 2 sprays into both nostrils 4 (four) times daily.  Marland Kitchen lidocaine (LIDODERM) 5 % Place 1 patch onto the skin  every 12 (twelve) hours. Remove & Discard patch within 12 hours or as directed by MD  . Multiple Vitamins-Minerals (ONE-A-DAY 50 PLUS PO) Take 1 tablet by mouth daily.  . traZODone (DESYREL) 50 MG tablet Take 0.5-1 tablets (25-50 mg total) by mouth at bedtime as needed for sleep.     Allergies:   Bactrim [sulfamethoxazole-trimethoprim]; Penicillins; and Cleocin [clindamycin hcl]   Social History   Tobacco Use  . Smoking status: Current Some Day Smoker    Packs/day: 0.25    Years: 30.00    Pack years: 7.50  . Smokeless tobacco: Former Systems developer  . Tobacco  comment: 1/4 - 1/2 ppd had quit for 1 year in 2010  Substance Use Topics  . Alcohol use: Yes    Comment: occasional  . Drug use: No    Types: Cocaine, Marijuana     Family Hx: The patient's family history includes Cancer in her mother; Depression in her mother; Diabetes in her mother; Emphysema in her father; Heart disease in her mother; Stroke in her mother.  ROS:   Please see the history of present illness.     All other systems reviewed and are negative.   Prior CV studies:   The following studies were reviewed today:    Labs/Other Tests and Data Reviewed:    EKG:  No ECG reviewed.  Recent Labs: 01/05/2019: ALT 19; BUN 11; Creat 0.92; Hemoglobin 13.6; Magnesium 2.2; Platelets 289; Potassium 3.9; Sodium 141; TSH 4.45   Recent Lipid Panel Lab Results  Component Value Date/Time   CHOL 176 03/29/2017 08:33 AM   TRIG 99 03/29/2017 08:33 AM   HDL 42 (L) 03/29/2017 08:33 AM   CHOLHDL 4.2 03/29/2017 08:33 AM   LDLCALC 114 (H) 03/29/2017 08:33 AM    Wt Readings from Last 3 Encounters:  03/23/19 (!) 320 lb (145.2 kg)  01/19/19 (!) 320 lb 12 oz (145.5 kg)  01/04/19 (!) 320 lb 9.6 oz (145.4 kg)     Objective:    Vital Signs:  BP (!) 148/82   Pulse 69   Ht 5\' 9"  (1.753 m)   Wt (!) 320 lb (145.2 kg)   BMI 47.26 kg/m    VITAL SIGNS:  reviewed GEN:  no acute distress EYES:  sclerae anicteric, EOMI - Extraocular Movements Intact RESPIRATORY:  normal respiratory effort, symmetric expansion SKIN:  no rash, lesions or ulcers. MUSCULOSKELETAL:  no obvious deformities. NEURO:  alert and oriented x 3, no obvious focal deficit PSYCH:  normal affect  ASSESSMENT & PLAN:    1.  Exertional dyspnea and chest pain: Some improvement in symptoms after improving her blood pressure but she continues to have symptoms.  Thus, I recommend evaluation with a Lexiscan Myoview and an echocardiogram.    2.  Essential hypertension: Blood pressure improved but still not controlled.  I am  going to add amlodipine 5 mg once daily.   3.  Mild hyperlipidemia: if coronary artery disease is found, treatment with a statin would be indicated.  4.  Tobacco use: She cut down to 1 to 2 cigarettes daily.  I discussed with her the importance of complete cessation.    COVID-19 Education: The signs and symptoms of COVID-19 were discussed with the patient and how to seek care for testing (follow up with PCP or arrange E-visit).  The importance of social distancing was discussed today.  Time:   Today, I have spent 12 minutes with the patient with telehealth technology discussing the above problems.  Medication Adjustments/Labs and Tests Ordered: Current medicines are reviewed at length with the patient today.  Concerns regarding medicines are outlined above.   Tests Ordered: No orders of the defined types were placed in this encounter.   Medication Changes: No orders of the defined types were placed in this encounter.   Disposition:  Follow up in 2 month(s)  Signed, Kathlyn Sacramento, MD  03/23/2019 1:46 PM    [facility]

## 2019-04-16 ENCOUNTER — Other Ambulatory Visit: Payer: PRIVATE HEALTH INSURANCE

## 2019-04-16 ENCOUNTER — Ambulatory Visit
Admission: RE | Admit: 2019-04-16 | Discharge: 2019-04-16 | Disposition: A | Discharge status: Home / Self Care | Payer: PRIVATE HEALTH INSURANCE | Source: Ambulatory Visit | Attending: Cardiovascular Disease | Admitting: Cardiovascular Disease

## 2019-04-16 ENCOUNTER — Other Ambulatory Visit: Payer: Self-pay

## 2019-04-16 DIAGNOSIS — R079 Chest pain, unspecified: Secondary | ICD-10-CM | POA: Insufficient documentation

## 2019-04-16 MED ORDER — TECHNETIUM TC 99M TETROFOSMIN IV KIT
29.6990 | PACK | Freq: Once | INTRAVENOUS | Status: AC | PRN
  Administered 2019-04-16: 29.699 via INTRAVENOUS

## 2019-04-16 MED ORDER — REGADENOSON 0.4 MG/5ML IV SOLN
0.4000 mg | Freq: Once | INTRAVENOUS | Status: AC
  Administered 2019-04-16: 0.4 mg via INTRAVENOUS

## 2019-04-17 ENCOUNTER — Ambulatory Visit
Admission: RE | Admit: 2019-04-17 | Discharge: 2019-04-17 | Disposition: A | Discharge status: Home / Self Care | Payer: PRIVATE HEALTH INSURANCE | Source: Ambulatory Visit | Attending: Cardiovascular Disease | Admitting: Cardiovascular Disease

## 2019-04-17 ENCOUNTER — Other Ambulatory Visit: Payer: PRIVATE HEALTH INSURANCE

## 2019-04-17 LAB — NM MYOCAR MULTI W/SPECT W/WALL MOTION / EF
Estimated workload: 1 METS
Exercise duration (min): 0 min
Exercise duration (sec): 0 s
LV dias vol: 114 mL (ref 46–106)
LV sys vol: 57 mL
MPHR: 165 {beats}/min
Peak HR: 78 {beats}/min
Percent HR: 47 %
Rest HR: 60 {beats}/min
TID: 1.06

## 2019-04-17 MED ORDER — TECHNETIUM TC 99M TETROFOSMIN IV KIT
29.7000 | PACK | Freq: Once | INTRAVENOUS | Status: AC | PRN
  Administered 2019-04-17: 29.7 via INTRAVENOUS

## 2019-04-19 ENCOUNTER — Telehealth: Payer: Self-pay

## 2019-04-19 NOTE — Telephone Encounter (Signed)

## 2019-04-20 ENCOUNTER — Other Ambulatory Visit: Payer: Self-pay

## 2019-04-20 ENCOUNTER — Ambulatory Visit (INDEPENDENT_AMBULATORY_CARE_PROVIDER_SITE_OTHER): Payer: PRIVATE HEALTH INSURANCE

## 2019-04-20 DIAGNOSIS — R0602 Shortness of breath: Secondary | ICD-10-CM

## 2019-04-25 ENCOUNTER — Telehealth: Payer: Self-pay | Admitting: Cardiovascular Disease

## 2019-04-25 NOTE — Telephone Encounter (Signed)
Hey Dr. Fletcher Anon,  I had Dr. Saunders Revel look at this, but it looks like your in your basket- can you review her results?

## 2019-04-25 NOTE — Telephone Encounter (Signed)
No significant abnormality.  Mild mitral annular calcification noted, which is common and unlikely to explain her symptoms.  Study is otherwise normal.  Further recommendations per Dr. Fletcher Anon when he returns.  Nelva Bush, MD [facility] Pager: [phone]

## 2019-04-25 NOTE — Telephone Encounter (Signed)
Patient calling  Would like to check the status of ECHO and stress test results  Please call to discuss

## 2019-04-25 NOTE — Telephone Encounter (Signed)
Will forward to DOD/ APP to see if they can look at her echo/ myoview results in Dr. Tyrell Antonio absence.

## 2019-05-02 ENCOUNTER — Telehealth: Payer: Self-pay

## 2019-05-02 NOTE — Telephone Encounter (Signed)
Spoke with patient and reviewed results. She verbalized understanding with no further questions at this time.  ?

## 2019-05-02 NOTE — Telephone Encounter (Signed)
Echo reviewed previously by Dr. Saunders Revel. Called the pt to make her aware that Dr.Arida has reviewed the results as well. lmtcb.

## 2019-05-02 NOTE — Telephone Encounter (Signed)
Left voicemail message on home number and no answer/no voicemail on patients other number.

## 2019-05-02 NOTE — Telephone Encounter (Signed)
-----   Message from Wellington Hampshire, MD sent at 05/01/2019  5:19 PM EDT ----- Inform patient that echo was fine.

## 2019-05-31 ENCOUNTER — Ambulatory Visit (INDEPENDENT_AMBULATORY_CARE_PROVIDER_SITE_OTHER): Payer: PRIVATE HEALTH INSURANCE | Admitting: Cardiovascular Disease

## 2019-05-31 ENCOUNTER — Other Ambulatory Visit: Payer: Self-pay

## 2019-05-31 ENCOUNTER — Encounter: Payer: Self-pay | Admitting: Cardiovascular Disease

## 2019-05-31 VITALS — BP 134/80 | HR 66 | Ht 69.0 in | Wt 315.0 lb

## 2019-05-31 DIAGNOSIS — I10 Essential (primary) hypertension: Secondary | ICD-10-CM

## 2019-05-31 DIAGNOSIS — R079 Chest pain, unspecified: Secondary | ICD-10-CM | POA: Diagnosis not present

## 2019-05-31 NOTE — Patient Instructions (Signed)
Medication Instructions:  Your physician recommends that you continue on your current medications as directed. Please refer to the Current Medication list given to you today.  If you need a refill on your cardiac medications before your next appointment, please call your pharmacy.   Lab work: None ordered If you have labs (blood work) drawn today and your tests are completely normal, you will receive your results only by: . MyChart Message (if you have MyChart) OR . A paper copy in the mail If you have any lab test that is abnormal or we need to change your treatment, we will call you to review the results.  Testing/Procedures: None ordered  Follow-Up: At [facility], you and your health needs are our priority.  As part of our continuing mission to provide you with exceptional heart care, we have created designated Provider Care Teams.  These Care Teams include your primary Cardiologist (physician) and Advanced Practice Providers (APPs -  Physician Assistants and Nurse Practitioners) who all work together to provide you with the care you need, when you need it. You will need a follow up appointment in 4 months.  Please call our office 2 months in advance to schedule this appointment.  You may see Dr. Arida or one of the following Advanced Practice Providers on your designated Care Team:   Christopher Berge, NP Ryan Dunn, PA-C . Jacquelyn Visser, PA-C    

## 2019-05-31 NOTE — Progress Notes (Signed)
Cardiology Office Note   Date:  05/31/2019   ID:  Shannon, Bryan [birth year], MRN [id number]  PCP:  Mikey College, NP  Cardiologist: Kathlyn Sacramento, MD  Chief Complaint  Patient presents with  . Other    3 month follow up. Patient c/o SOB and swelling in ankles. Meds reviewed verbally with patient.       History of Present Illness: Shannon Bryan is a 58 y.o. female who presents for a follow up visit regarding exertional chest pain and shortness of breath. She has known history of essential hypertension, mild hyperlipidemia, tobacco use and obesity. She was seen in March for worsening exertional dyspnea and chest pain in the setting of uncontrolled hypertension with blood pressure as high as 128 systolic.  She was on hydrochlorothiazide 25 mg once daily.  I added carvedilol 6.25 mg twice daily.  She underwent a Lexiscan Myoview which showed fixed anterior wall defect likely due to breast attenuation.  Ejection fraction estimation was not accurate.  Echocardiogram showed an EF of 55 to 60% with no significant valvular abnormalities or pulmonary hypertension.  She did well after adjusting antihypertensive medications.  Unfortunately, her brother-in-law died recently from COVID-21.  Since then, multiple family members have contracted the virus and this has been very stressful on her.  She did have some episodes of substernal chest pain and tightness at rest as a result.      Past Medical History:  Diagnosis Date  . Asthma   . Blood clot in vein   . Chronic kidney disease   . Hyperlipidemia   . Hypertension   . Kidney stone     Past Surgical History:  Procedure Laterality Date  . ABDOMINAL HYSTERECTOMY    . CHOLECYSTECTOMY    . ROTATOR CUFF REPAIR Right   . TUBAL LIGATION       Current Outpatient Medications  Medication Sig Dispense Refill  . acetaminophen (TYLENOL) 500 MG tablet Take 500 mg by mouth every 6 (six) hours as needed.    Marland Kitchen amLODipine  (NORVASC) 5 MG tablet Take 1 tablet (5 mg total) by mouth daily. 30 tablet 2  . aspirin EC 81 MG tablet Take 81 mg by mouth daily.    . baclofen (LIORESAL) 10 MG tablet Take 1 tablet (10 mg total) by mouth 3 (three) times daily. 60 each 0  . benzonatate (TESSALON) 100 MG capsule Take 1 capsule (100 mg total) by mouth 2 (two) times daily as needed for cough. 20 capsule 0  . carvedilol (COREG) 6.25 MG tablet Take 1 tablet (6.25 mg total) by mouth 2 (two) times daily. 180 tablet 3  . cyclobenzaprine (FLEXERIL) 5 MG tablet Take 1-2 tablets 3 times daily as needed 20 tablet 0  . gabapentin (NEURONTIN) 100 MG capsule Take 2 capsules (200 mg total) by mouth at bedtime. 40 capsule 0  . hydrochlorothiazide (HYDRODIURIL) 25 MG tablet Take 1 tablet (25 mg total) by mouth daily. 90 tablet 1  . hydrOXYzine (ATARAX/VISTARIL) 10 MG tablet Take 1 tablet (10 mg total) by mouth 3 (three) times daily as needed for itching. 30 tablet 0  . ibuprofen (ADVIL,MOTRIN) 800 MG tablet Take 1 tablet (800 mg total) by mouth every 8 (eight) hours as needed. 30 tablet 0  . ipratropium (ATROVENT) 0.06 % nasal spray Place 2 sprays into both nostrils 4 (four) times daily. 15 mL 0  . Multiple Vitamins-Minerals (ONE-A-DAY 50 PLUS PO) Take 1 tablet by mouth daily.    . traZODone (  DESYREL) 50 MG tablet Take 0.5-1 tablets (25-50 mg total) by mouth at bedtime as needed for sleep. 30 tablet 5   No current facility-administered medications for this visit.     Allergies:   Bactrim [sulfamethoxazole-trimethoprim], Penicillins, and Cleocin [clindamycin hcl]    Social History:  The patient  reports that she has been smoking. She has a 7.50 pack-year smoking history. She has quit using smokeless tobacco. She reports current alcohol use. She reports that she does not use drugs.   Family History:  The patient's family history includes Cancer in her mother; Depression in her mother; Diabetes in her mother; Emphysema in her father; Heart disease  in her mother; Stroke in her mother.    ROS:  Please see the history of present illness.   Otherwise, review of systems are positive for none.   All other systems are reviewed and negative.    PHYSICAL EXAM: VS:  BP 134/80 (BP Location: Left Arm, Patient Position: Sitting, Cuff Size: Large)   Pulse 66   Ht 5\' 9"  (1.753 m)   Wt (!) 315 lb (142.9 kg)   BMI 46.52 kg/m  , BMI Body mass index is 46.52 kg/m. GEN: Well nourished, well developed, in no acute distress HEENT: normal Neck: no JVD, carotid bruits, or masses Cardiac: RRR; no murmurs, rubs, or gallops, trace edema  Respiratory:  clear to auscultation bilaterally, normal work of breathing GI: soft, nontender, nondistended, + BS MS: no deformity or atrophy Skin: warm and dry, no rash Neuro:  Strength and sensation are intact Psych: euthymic mood, full affect   EKG:  EKG is ordered today. The ekg ordered today demonstrates sinus rhythm with low voltage and poor R wave progression in the anterior leads.  Not different from before.   Recent Labs: 01/05/2019: ALT 19; BUN 11; Creat 0.92; Hemoglobin 13.6; Magnesium 2.2; Platelets 289; Potassium 3.9; Sodium 141; TSH 4.45    Lipid Panel    Component Value Date/Time   CHOL 176 03/29/2017 0833   TRIG 99 03/29/2017 0833   HDL 42 (L) 03/29/2017 0833   CHOLHDL 4.2 03/29/2017 0833   VLDL 20 03/29/2017 0833   LDLCALC 114 (H) 03/29/2017 0833      Wt Readings from Last 3 Encounters:  05/31/19 (!) 315 lb (142.9 kg)  03/23/19 (!) 320 lb (145.2 kg)  01/19/19 (!) 320 lb 12 oz (145.5 kg)       ASSESSMENT AND PLAN:  1.  Exertional dyspnea and chest pain: Significant improvement in symptoms after improving her blood pressure .  Lexiscan Myoview was reassuring although it was suboptimal overall.  Echo also showed no significant abnormalities. Recent episodes of chest pain in the setting of stress and anxiety after death in her family.  For now we will continue to monitor her symptoms  and if there is worsening, neck step is to proceed with left heart catheterization.  2.  Essential hypertension: Blood pressure is now well controlled on current medications.  3.  Mild hyperlipidemia: if coronary artery disease is found, treatment with a statin would be indicated.  4.  Tobacco use: She cut down to 1 to 2 cigarettes daily.  I discussed with her the importance of complete cessation.    Disposition:   FU with me in 4 months   Signed, Kathlyn Sacramento, MD 05/31/19 [place], [place]

## 2019-07-11 ENCOUNTER — Other Ambulatory Visit: Payer: Self-pay

## 2019-07-11 ENCOUNTER — Ambulatory Visit (INDEPENDENT_AMBULATORY_CARE_PROVIDER_SITE_OTHER): Payer: Self-pay

## 2019-07-11 ENCOUNTER — Ambulatory Visit: Payer: Self-pay

## 2019-07-11 DIAGNOSIS — Z23 Encounter for immunization: Secondary | ICD-10-CM

## 2019-09-29 ENCOUNTER — Other Ambulatory Visit: Payer: Self-pay | Admitting: Nurse Practitioner

## 2019-09-29 DIAGNOSIS — I10 Essential (primary) hypertension: Secondary | ICD-10-CM

## 2019-10-16 ENCOUNTER — Ambulatory Visit (INDEPENDENT_AMBULATORY_CARE_PROVIDER_SITE_OTHER): Payer: PRIVATE HEALTH INSURANCE | Admitting: Family

## 2019-10-16 ENCOUNTER — Other Ambulatory Visit: Payer: Self-pay

## 2019-10-16 ENCOUNTER — Encounter: Payer: Self-pay | Admitting: Family

## 2019-10-16 VITALS — BP 150/90 | HR 75 | Ht 69.0 in | Wt 322.8 lb

## 2019-10-16 DIAGNOSIS — Z79899 Other long term (current) drug therapy: Secondary | ICD-10-CM | POA: Diagnosis not present

## 2019-10-16 DIAGNOSIS — I10 Essential (primary) hypertension: Secondary | ICD-10-CM | POA: Diagnosis not present

## 2019-10-16 DIAGNOSIS — Z720 Tobacco use: Secondary | ICD-10-CM

## 2019-10-16 DIAGNOSIS — R0609 Other forms of dyspnea: Secondary | ICD-10-CM

## 2019-10-16 DIAGNOSIS — R600 Localized edema: Secondary | ICD-10-CM

## 2019-10-16 DIAGNOSIS — E785 Hyperlipidemia, unspecified: Secondary | ICD-10-CM | POA: Diagnosis not present

## 2019-10-16 DIAGNOSIS — R0600 Dyspnea, unspecified: Secondary | ICD-10-CM

## 2019-10-16 DIAGNOSIS — R0789 Other chest pain: Secondary | ICD-10-CM

## 2019-10-16 MED ORDER — AMLODIPINE BESYLATE 5 MG PO TABS: 5.0000 mg | ORAL_TABLET | Freq: Every day | ORAL | 3 refills | Status: DC

## 2019-10-16 NOTE — Patient Instructions (Addendum)
Medication Instructions:  Your physician has recommended you make the following change in your medication:   RESUME Amlodipine 5mg  daily  START Spironolactone 25mg  daily  *If you need a refill on your cardiac medications before your next appointment, please call your pharmacy*  Lab Work: Your physician recommends that you have lab work today: BMET  If you have labs (blood work) drawn today and your tests are completely normal, you will receive your results only by: Marland Kitchen MyChart Message (if you have MyChart) OR . A paper copy in the mail If you have any lab test that is abnormal or we need to change your treatment, we will call you to review the results.  Testing/Procedures: None ordered today.  Follow-Up: At [facility], you and your health needs are our priority.  As part of our continuing mission to provide you with exceptional heart care, we have created designated Provider Care Teams.  These Care Teams include your primary Cardiologist (physician) and Advanced Practice Providers (APPs -  Physician Assistants and Nurse Practitioners) who all work together to provide you with the care you need, when you need it.  Your next appointment:   1 month(s)  The format for your next appointment:   In Person  Provider:    You may see Kathlyn Sacramento, MD or one of the following Advanced Practice Providers on your designated Care Team:    Murray Hodgkins, NP  Christell Faith, PA-C  Marrianne Mood, PA-C   Other Instructions   Try to find "diabetic socks" at the store to see if those are more tolerable than compression stockings. Or purchase a new set of compression stockings.    Continue low salt diet.   Continue elevating your legs when you are sitting.  Spironolactone tablets What is this medicine? SPIRONOLACTONE (speer on oh LAK tone) is a diuretic. It helps you make more urine and to lose excess water from your body. This medicine is used to treat high blood pressure, and  edema or swelling from heart, kidney, or liver disease. It is also used to treat patients who make too much aldosterone or have low potassium. This medicine may be used for other purposes; ask your health care provider or pharmacist if you have questions. COMMON BRAND NAME(S): Aldactone What should I tell my health care provider before I take this medicine? They need to know if you have any of these conditions:  high blood level of potassium  kidney disease or trouble making urine  liver disease  an unusual or allergic reaction to spironolactone, other medicines, foods, dyes, or preservatives  pregnant or trying to get pregnant  breast-feeding How should I use this medicine? Take this medicine by mouth with a drink of water. Follow the directions on your prescription label. You can take it with or without food. If it upsets your stomach, take it with food. Do not take your medicine more often than directed. Remember that you will need to pass more urine after taking this medicine. Do not take your doses at a time of day that will cause you problems. Do not take at bedtime. Talk to your pediatrician regarding the use of this medicine in children. While this drug may be prescribed for selected conditions, precautions do apply. Overdosage: If you think you have taken too much of this medicine contact a poison control center or emergency room at once. NOTE: This medicine is only for you. Do not share this medicine with others. What if I miss a dose?  If you miss a dose, take it as soon as you can. If it is almost time for your next dose, take only that dose. Do not take double or extra doses. What may interact with this medicine? Do not take this medicine with any of the following medications:  cidofovir  eplerenone  tranylcypromine This medicine may also interact with the following medications:  aspirin  certain medicines for blood pressure or heart disease like benazepril,  lisinopril, losartan, valsartan  certain medicines that treat or prevent blood clots like heparin and enoxaparin  cholestyramine  cyclosporine  digoxin  lithium  medicines that relax muscles for surgery  NSAIDs, medicines for pain and inflammation, like ibuprofen or naproxen  other diuretics  potassium supplements  steroid medicines like prednisone or cortisone  trimethoprim This list may not describe all possible interactions. Give your health care provider a list of all the medicines, herbs, non-prescription drugs, or dietary supplements you use. Also tell them if you smoke, drink alcohol, or use illegal drugs. Some items may interact with your medicine. What should I watch for while using this medicine? Visit your doctor or health care professional for regular checks on your progress. Check your blood pressure as directed. Ask your doctor what your blood pressure should be, and when you should contact them. You may need to be on a special diet while taking this medicine. Ask your doctor. Also, ask how many glasses of fluid you need to drink a day. You must not get dehydrated. This medicine may make you feel confused, dizzy or lightheaded. Drinking alcohol and taking some medicines can make this worse. Do not drive, use machinery, or do anything that needs mental alertness until you know how this medicine affects you. Do not sit or stand up quickly. What side effects may I notice from receiving this medicine? Side effects that you should report to your doctor or health care professional as soon as possible:  allergic reactions such as skin rash or itching, hives, swelling of the lips, mouth, tongue, or throat  black or tarry stools  fast, irregular heartbeat  fever  muscle pain, cramps  numbness, tingling in hands or feet  trouble breathing  trouble passing urine  unusual bleeding  unusually weak or tired Side effects that usually do not require medical attention  (report to your doctor or health care professional if they continue or are bothersome):  change in voice or hair growth  confusion  dizzy, drowsy  dry mouth, increased thirst  enlarged or tender breasts  headache  irregular menstrual periods  sexual difficulty, unable to have an erection  stomach upset This list may not describe all possible side effects. Call your doctor for medical advice about side effects. You may report side effects to FDA at 1-800-FDA-1088. Where should I keep my medicine? Keep out of the reach of children. Store below 25 degrees C (77 degrees F). Throw away any unused medicine after the expiration date. NOTE: This sheet is a summary. It may not cover all possible information. If you have questions about this medicine, talk to your doctor, pharmacist, or health care provider.  2020 Elsevier/Gold Standard (2017-03-04 09:42:28)

## 2019-10-16 NOTE — Progress Notes (Signed)
Office Visit    Patient Name: Shannon Bryan Date of Encounter: 10/16/2019  Primary Care Provider:  Mikey College, NP (Inactive) Primary Cardiologist:  Kathlyn Sacramento, MD Electrophysiologist:  None   Chief Complaint    Shannon Bryan is a 58 y.o. female with a hx of HTN, mild HLD, tobacco use, obesity, DOE, chest pain presents today for 4 month follow up of exertional dyspnea and chest pain.   Past Medical History    Past Medical History:  Diagnosis Date  . Asthma   . Blood clot in vein   . Chronic kidney disease   . Hyperlipidemia   . Hypertension   . Kidney stone    Past Surgical History:  Procedure Laterality Date  . ABDOMINAL HYSTERECTOMY    . CHOLECYSTECTOMY    . ROTATOR CUFF REPAIR Right   . TUBAL LIGATION      Allergies  Allergies  Allergen Reactions  . Bactrim [Sulfamethoxazole-Trimethoprim] Hives  . Penicillins Swelling  . Cleocin [Clindamycin Hcl] Rash    History of Present Illness    Shannon Bryan is a 58 y.o. female with a hx of HTN, mild HLD, tobacco use, obesity, DOE, chest pain last seen 05/31/19 by Dr. Fletcher Anon. She presents today for 4 month follow up of exertional dyspnea and chest pain.  Seen 12/2018 with worsening exertional dyspnea and chest pain in setting of uncontrolled HTN with BP as high as [id number] systolic. She was on HCTZ 25mg  daily and Carvedilol 6.25 mg BID was added by Dr. Fletcher Anon. Underwent Lexiscan with fixed anterior wall defect likely due to breast attenuation. EF estimation not accurate. Echocardiogram 04/20/19 with LVEF 55-60%, no significant valvular abnormalities, no pulmonary HTN.  At last office visit she was noted to have increased stress as her brother-in-law had recently died from COVID-2 and multiple family members had contracted the virus.   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 Reports work has been particularly stressful due to [place]. She has lost 18 patients, offered my condolences.   Reports she has been out of  her amlodipine.  She is not certain when her last dose was.  Has not been checking her blood pressure routinely at home.  Her primary complaint today is that her "feet swell up so bad they bruise on top".  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  She does try to elevate her feet when she gets home in the evening her swelling improved by morning.  She is trying to wear compression stockings but reports all of the pair she is on have been too small.  She reports she will have episodes of chest pain below her right breast and back pain with increased physical activity or increased stress.  No left-sided chest pain, no radiation to arm or neck.  This pain is not associated with diaphoresis nor shortness of breath.  This pain self resolves with rest.  We discussed that this is not likely anginal chest pain.  She that since she was last seen these episodes have been decreasing in frequency.  EKGs/Labs/Other Studies Reviewed:   The following studies were reviewed today:  Echocardiogram 04/20/2019  1. The left ventricle has normal systolic function, with an ejection fraction of 55-60%. The cavity size was normal. Left ventricular diastolic parameters were normal. No evidence of left ventricular regional wall motion abnormalities.  2. The right ventricle has normal systolic function. The cavity was normal. There is no increase in right  ventricular wall thickness. Right ventricular systolic pressure is normal.  3. The mitral valve is grossly normal. There is mild mitral annular calcification present.  4. The aortic valve was not well visualized.  5. The aortic root and ascending aorta are normal in size and structure.  6. The interatrial septum was not well visualized.   EKG:  No EKG today.  Recent Labs: 01/05/2019: ALT 19; BUN 11; Creat 0.92; Hemoglobin 13.6; Magnesium 2.2; Platelets 289; Potassium 3.9; Sodium 141; TSH 4.45  Recent Lipid Panel    Component Value  Date/Time   CHOL 176 03/29/2017 0833   TRIG 99 03/29/2017 0833   HDL 42 (L) 03/29/2017 0833   CHOLHDL 4.2 03/29/2017 0833   VLDL 20 03/29/2017 0833   LDLCALC 114 (H) 03/29/2017 0833    Home Medications   Current Meds  Medication Sig  . acetaminophen (TYLENOL) 500 MG tablet Take 500 mg by mouth every 6 (six) hours as needed.  Marland Kitchen aspirin EC 81 MG tablet Take 81 mg by mouth daily.  . carvedilol (COREG) 6.25 MG tablet Take 1 tablet (6.25 mg total) by mouth 2 (two) times daily.  . hydrochlorothiazide (HYDRODIURIL) 25 MG tablet Take 1 tablet by mouth once daily  . ibuprofen (ADVIL,MOTRIN) 800 MG tablet Take 1 tablet (800 mg total) by mouth every 8 (eight) hours as needed.  Marland Kitchen ipratropium (ATROVENT) 0.06 % nasal spray Place 2 sprays into both nostrils 4 (four) times daily.  . Multiple Vitamins-Minerals (ONE-A-DAY 50 PLUS PO) Take 1 tablet by mouth daily.  . traZODone (DESYREL) 50 MG tablet Take 0.5-1 tablets (25-50 mg total) by mouth at bedtime as needed for sleep.     Review of Systems       Review of Systems  Constitution: Negative for chills, fever and malaise/fatigue.  Cardiovascular: Positive for chest pain ("below R breast") and leg swelling. Negative for dyspnea on exertion, near-syncope, orthopnea, palpitations and syncope.  Respiratory: Negative for cough, shortness of breath and wheezing.   Musculoskeletal: Positive for back pain.  Gastrointestinal: Negative for nausea and vomiting.  Neurological: Negative for dizziness, light-headedness and weakness.   All other systems reviewed and are otherwise negative except as noted above.  Physical Exam    VS:  BP (!) 150/90 (BP Location: Left Arm, Patient Position: Sitting, Cuff Size: Large)   Pulse 75   Ht 5\' 9"  (1.753 m)   Wt (!) 322 lb 12 oz (146.4 kg)   SpO2 99%   BMI 47.66 kg/m  , BMI Body mass index is 47.66 kg/m. GEN: Well nourished, overweight, well developed, in no acute distress. HEENT: normal. Neck: Supple, no JVD,  carotid bruits, or masses. Cardiac: RRR, no murmurs, rubs, or gallops. No clubbing, cyanosis.  Lateral lower extremities with nonpitting edema to ankle.  Radials/PT 2+ and equal bilaterally.  Respiratory:  Respirations regular and unlabored, clear to auscultation bilaterally. GI: Soft, nontender, nondistended, BS + x 4. MS: No deformity or atrophy. Skin: Warm and dry, no rash. Neuro:  Strength and sensation are intact. Psych: Normal affect.  Accessory Clinical Findings    ECG personally reviewed by me today -75 bpm sinus rhythm low voltage QRS likely due to body habitus, no acute ST/T wave changes- no acute changes.  Assessment & Plan    1. Exertional dyspnea and chest pain - Reports episodes of chest pain and exertional dyspnea are decreasing.  The pain is under her right breast and her back.  We discussed that these symptoms are very atypical for  angina.  They occur at times of stress than likely correlate with elevated blood pressures.  Stress test 04/16/2019 with fixed anterior wall defect likely due to breast attenuation.  Echo 04/2019 normal LVEF, normal RV size/function. EKG today SR 75 bpm with no acute ST/T wave changes. Will plan to optimize BP. DOE likely etiology deconditioning. Symptoms very atypical for angina and as such, no ischemic evaluation at this time. If symptoms do not continue to improve with optimization of HTN will require cardiac cath.  2. Bilateral LE edema - Echo 04/2019 LVEF 55 to 60%, normal LV diastolic function, normal RV size and function. Likely etiology venous insufficiency and being in dependent position most of day. Start Spironolactone 25mg  daily as it will additionally help with elevated BP. BMET today. Recommend compression stockings, low sodium diet, elevating lower extremities.   3. HTN -BP not well controlled as she has been out of her amlodipine for undetermined amount of time.  Resume amlodipine.  Continue low-sodium diet.  4. Mild HLD - Per Dr. Fletcher Anon  if coronary artery disease is found, treatment of statin indicated.  She will continue to follow with her PCP.  5. Tobacco abuse - Reports she is not functionally stressed. Smoking cessation encouraged. Recommend utilization of [id number].   Disposition: Follow up in 1 month(s) with Dr. Fletcher Anon or APP.   Loel Dubonnet, NP 10/16/2019, 11:28 AM

## 2019-10-17 ENCOUNTER — Telehealth: Payer: Self-pay

## 2019-10-17 ENCOUNTER — Telehealth: Payer: Self-pay | Admitting: Family

## 2019-10-17 LAB — BASIC METABOLIC PANEL
BUN/Creatinine Ratio: 11 (ref 9–23)
BUN: 10 mg/dL (ref 6–24)
CO2: 20 mmol/L (ref 20–29)
Calcium: 9.4 mg/dL (ref 8.7–10.2)
Chloride: 103 mmol/L (ref 96–106)
Creatinine, Ser: 0.91 mg/dL (ref 0.57–1.00)
GFR calc Af Amer: 82 mL/min/{1.73_m2} (ref >59)
GFR calc non Af Amer: 71 mL/min/{1.73_m2} (ref >59)
Glucose: 138 mg/dL — ABNORMAL HIGH (ref 65–99)
Potassium: 3.7 mmol/L (ref 3.5–5.2)
Sodium: 142 mmol/L (ref 134–144)

## 2019-10-17 MED ORDER — SPIRONOLACTONE 25 MG PO TABS: 25.0000 mg | ORAL_TABLET | Freq: Every day | ORAL | 0 refills | Status: DC

## 2019-10-17 NOTE — Telephone Encounter (Signed)
*  STAT* If patient is at the pharmacy, call can be transferred to refill team.   1. Which medications need to be refilled? (please list name of each medication and dose if known) Spironolactone  25 mg po q d   2. Which pharmacy/location (including street and city if local pharmacy) is medication to be sent to? [facility] [address]   3. Do they need a 30 day or 90 day supply? 90  Not sent at [facility]

## 2019-10-17 NOTE — Telephone Encounter (Signed)
Requested Prescriptions   Signed Prescriptions Disp Refills   spironolactone (ALDACTONE) 25 MG tablet 90 tablet 0    Sig: Take 1 tablet (25 mg total) by mouth daily.    Authorizing Provider: Loel Dubonnet    Ordering User: Raelene Bott, BRANDY L

## 2019-10-17 NOTE — Telephone Encounter (Signed)
Called to give the patient lab results. [email or web address] °

## 2019-10-17 NOTE — Telephone Encounter (Signed)
-----   Message from Loel Dubonnet, NP sent at 10/17/2019  8:32 AM EST ----- Good result. Normal kidney function and electrolytes. Glucose elevated - anticipate she was not fasting during her office visit - not of concern. Plan to proceed with medication changes as discussed in office visit.

## 2019-10-17 NOTE — Telephone Encounter (Signed)
Patient made aware of lab results with verbalized understanding. 

## 2019-10-29 ENCOUNTER — Other Ambulatory Visit: Payer: Self-pay

## 2019-10-29 ENCOUNTER — Ambulatory Visit: Payer: Self-pay | Admitting: Family Medicine

## 2019-10-29 ENCOUNTER — Encounter: Payer: Self-pay | Admitting: Family Medicine

## 2019-10-29 ENCOUNTER — Ambulatory Visit (INDEPENDENT_AMBULATORY_CARE_PROVIDER_SITE_OTHER): Payer: Self-pay | Admitting: Family Medicine

## 2019-10-29 VITALS — Ht 69.0 in | Wt 310.0 lb

## 2019-10-29 DIAGNOSIS — N62 Hypertrophy of breast: Secondary | ICD-10-CM

## 2019-10-29 DIAGNOSIS — G8929 Other chronic pain: Secondary | ICD-10-CM

## 2019-10-29 DIAGNOSIS — Z6841 Body Mass Index (BMI) 40.0 and over, adult: Secondary | ICD-10-CM

## 2019-10-29 DIAGNOSIS — M549 Dorsalgia, unspecified: Secondary | ICD-10-CM

## 2019-10-29 NOTE — Progress Notes (Signed)
Virtual Visit via Telephone The purpose of this virtual visit is to provide medical care while limiting exposure to the novel coronavirus (COVID19) for both patient and office staff.  Consent was obtained for phone visit:  Yes.   Answered questions that patient had about telehealth interaction:  Yes.   I discussed the limitations, risks, security and privacy concerns of performing an evaluation and management service by telephone. I also discussed with the patient that there may be a patient responsible charge related to this service. The patient expressed understanding and agreed to proceed.  Patient Location: Home Provider Location: [facility])  ---------------------------------------------------------------------- Chief Complaint  Patient presents with  . Breast Pain    need referral for breast surgery    S: Reviewed CMA documentation. I have called patient and gathered additional HPI as follows:  Chronic Upper Back Pain / Morbid Obesity BMI >47 Reports chronic problem with enlarged breasts. She is obese with weight 310 lbs, see BMI below. She has had issue with complications due to enlarged breasts with chronic upper back pain between shoulder blades and also bra straps weigh on her shoulders and have caused laceration in skin before. She is interested in breast reduction surgery and has already looked into preferred surgeon, at [place]  Denies any high risk travel to areas of current concern for [place]. Denies any known or suspected exposure to person with or possibly with COVID19.  Denies any fevers, chills, sweats, body ache, cough, shortness of breath, sinus pain or pressure, headache, abdominal pain, diarrhea  Past Medical History:  Diagnosis Date  . Asthma   . Blood clot in vein   . Chronic kidney disease   . Hyperlipidemia   . Hypertension   . Kidney stone    Social History   Tobacco Use  . Smoking  status: Current Some Day Smoker    Packs/day: 0.25    Years: 30.00    Pack years: 7.50  . Smokeless tobacco: Former Systems developer  . Tobacco comment: 1/4 - 1/2 ppd had quit for 1 year in 2010  Substance Use Topics  . Alcohol use: Yes    Comment: occasional  . Drug use: No    Types: Cocaine, Marijuana    Current Outpatient Medications:  .  acetaminophen (TYLENOL) 500 MG tablet, Take 500 mg by mouth every 6 (six) hours as needed., Disp: , Rfl:  .  amLODipine (NORVASC) 5 MG tablet, Take 1 tablet (5 mg total) by mouth daily., Disp: 90 tablet, Rfl: 3 .  aspirin EC 81 MG tablet, Take 81 mg by mouth daily., Disp: , Rfl:  .  carvedilol (COREG) 6.25 MG tablet, Take 1 tablet (6.25 mg total) by mouth 2 (two) times daily., Disp: 180 tablet, Rfl: 3 .  hydrochlorothiazide (HYDRODIURIL) 25 MG tablet, Take 1 tablet by mouth once daily, Disp: 90 tablet, Rfl: 0 .  ibuprofen (ADVIL,MOTRIN) 800 MG tablet, Take 1 tablet (800 mg total) by mouth every 8 (eight) hours as needed., Disp: 30 tablet, Rfl: 0 .  ipratropium (ATROVENT) 0.06 % nasal spray, Place 2 sprays into both nostrils 4 (four) times daily., Disp: 15 mL, Rfl: 0 .  Multiple Vitamins-Minerals (ONE-A-DAY 50 PLUS PO), Take 1 tablet by mouth daily., Disp: , Rfl:  .  spironolactone (ALDACTONE) 25 MG tablet, Take 1 tablet (25 mg total) by mouth daily., Disp: 90 tablet, Rfl: 0 .  traZODone (DESYREL) 50 MG tablet, Take 0.5-1 tablets (25-50 mg total) by mouth  at bedtime as needed for sleep., Disp: 30 tablet, Rfl: 5  Depression screen [facility] 2/9 10/29/2019 01/04/2019  Decreased Interest 0 3  Down, Depressed, Hopeless 0 2  PHQ - 2 Score 0 5  Altered sleeping - 3  Tired, decreased energy - 3  Change in appetite - 3  Feeling bad or failure about yourself  - 0  Trouble concentrating - 1  Moving slowly or fidgety/restless - 0  Suicidal thoughts - 0  PHQ-9 Score - 15  Difficult doing work/chores - Somewhat difficult    GAD 7 : Generalized Anxiety Score 01/04/2019   Nervous, Anxious, on Edge 1  Control/stop worrying 3  Worry too much - different things 3  Trouble relaxing 3  Restless 1  Easily annoyed or irritable 3  Afraid - awful might happen 3  Total GAD 7 Score 17  Anxiety Difficulty Somewhat difficult    -------------------------------------------------------------------------- O: No physical exam performed due to remote telephone encounter.  Ht 5\' 9"  (1.753 m)   Wt (!) 310 lb (140.6 kg)   BMI 45.78 kg/m    Lab results reviewed.  Recent Results (from the past 2160 hour(s))  Basic metabolic panel     Status: Abnormal   Collection Time: 10/16/19 10:59 AM  Result Value Ref Range   Glucose 138 (H) 65 - 99 mg/dL   BUN 10 6 - 24 mg/dL   Creatinine, Ser 0.91 0.57 - 1.00 mg/dL   GFR calc non Af Amer 71 >59 mL/min/1.73   GFR calc Af Amer 82 >59 mL/min/1.73   BUN/Creatinine Ratio 11 9 - 23   Sodium 142 134 - 144 mmol/L   Potassium 3.7 3.5 - 5.2 mmol/L   Chloride 103 96 - 106 mmol/L   CO2 20 20 - 29 mmol/L   Calcium 9.4 8.7 - 10.2 mg/dL    -------------------------------------------------------------------------- A&P:  Problem List Items Addressed This Visit    None    Visit Diagnoses    Large breasts    -  Primary   Relevant Orders   Ambulatory referral to Plastic Surgery   Morbid obesity with BMI of 45.0-49.9, adult [facility])       Relevant Orders   Ambulatory referral to Plastic Surgery   Chronic upper back pain       Relevant Orders   Ambulatory referral to Plastic Surgery     Chronic problem with obesity and large breasts causing secondary complications with upper back pain caused by issue with weight of excessive breast tissue also difficulty wearing bra with straps as cause injury to skin  Referral to requested preferred Plastic Surgery in [place].  Orders Placed This Encounter  Procedures  . Ambulatory referral to Plastic Surgery    Referral Priority:   Routine    Referral Type:   Surgical    Referral Reason:    Specialty Services Required    Referred to Provider:   Contogiannis, Audrea Muscat, MD    Requested Specialty:   Plastic Surgery    Number of Visits Requested:   1     No orders of the defined types were placed in this encounter.   Follow-up: - Return as needed  Patient verbalizes understanding with the above medical recommendations including the limitation of remote medical advice.  Specific follow-up and call-back criteria were given for patient to follow-up or seek medical care more urgently if needed.   - Time spent in direct consultation with patient on phone: 10 minutes   Nobie Putnam, [facility] 10/29/2019, 4:08 PM

## 2019-10-29 NOTE — Patient Instructions (Signed)
° °  Please schedule a Follow-up Appointment to: No follow-ups on file. ° °If you have any other questions or concerns, please feel free to call the office or send a message through MyChart. You may also schedule an earlier appointment if necessary. ° °Additionally, you may be receiving a survey about your experience at our office within a few days to 1 week by e-mail or mail. We value your feedback. ° °Lakyla Biswas, DO °[facility] °

## 2019-11-08 ENCOUNTER — Encounter: Payer: Self-pay | Admitting: Family Medicine

## 2019-11-18 NOTE — Progress Notes (Signed)
Cardiology Office Note    Date:  11/20/2019   ID:  Domineque, Burgi 1961-08-25, MRN [id number]  PCP:  Mikey College, NP (Inactive)  Cardiologist:  Kathlyn Sacramento, MD  Electrophysiologist:  None   Chief Complaint: Follow up  History of Present Illness:   Coutney Cardo is a 59 y.o. female with history of HTN, mild HLD, tobacco use, and obesity who presents for follow up of hypertension.   She was seen in 12/2018 with worsening exertional dyspnea and chest pain in the setting of uncontrolled hypertension with BP as high as A999333 mmHg systolic. She was on HCTZ at that time. Coreg was added. She underwent Lexiscan Myoview in 04/2019 which showed a fixed anterior wall defect likely secondary to breast attenuation artifact. EF estimation was inaccurate. In this setting, she underwent echo in 04/2019, which showed an EF of 55-60% with no significant valvular abnormalities or pulmonary hypertension.  She was under increased stress in 05/2019 with her brother having passed away with COVID-19 and having had multiple family members sick with the virus. She was last seen in the office in 10/2019 for follow up and had been out of her amlodipine. She noted pedal swelling that was worse throughout the day and improved with elevation and in the mornings. She also noted some right-sided chest pain and back pain with increased activity and stress. BP was poorly controlled at 150/90. She was started on spironolactone and amlodipine was resumed.   She comes in today feeling "great."  She notes since initiation of spironolactone her lower extremity swelling has resolved and she is now back in her regular tennis shoes.  Her weight is down 10 pounds when compared to her last office visit.  She has noted resolution of her chest discomfort and dyspnea.  Her only complaint now at this time is chronic low back pain for which she is hoping to have surgery for any future.  With this, she does request a refill her  ibuprofen today.  Otherwise, she does not have any issues or concerns.   Labs independently reviewed: 10/2019 - BUN 10, SCr 0.91, potassium 3.7 12/2018 - A1c 6.3, HGB 13.6, PLT 289, magnesium 2.2, albumin 3.8, AST/ALT normal, TSH normal 03/2017 - TC 176, TG 99, HDL 42, LDL 114  Past Medical History:  Diagnosis Date  . Asthma   . Blood clot in vein   . Chronic kidney disease   . Hyperlipidemia   . Hypertension   . Kidney stone     Past Surgical History:  Procedure Laterality Date  . ABDOMINAL HYSTERECTOMY    . CHOLECYSTECTOMY    . ROTATOR CUFF REPAIR Right   . TUBAL LIGATION      Current Medications: Current Meds  Medication Sig  . acetaminophen (TYLENOL) 500 MG tablet Take 500 mg by mouth every 6 (six) hours as needed.  Marland Kitchen amLODipine (NORVASC) 5 MG tablet Take 1 tablet (5 mg total) by mouth daily.  Marland Kitchen aspirin EC 81 MG tablet Take 81 mg by mouth daily.  . carvedilol (COREG) 6.25 MG tablet Take 1 tablet (6.25 mg total) by mouth 2 (two) times daily.  . hydrochlorothiazide (HYDRODIURIL) 25 MG tablet Take 1 tablet by mouth once daily  . ipratropium (ATROVENT) 0.06 % nasal spray Place 2 sprays into both nostrils 4 (four) times daily.  . Multiple Vitamins-Minerals (ONE-A-DAY 50 PLUS PO) Take 1 tablet by mouth daily.  Marland Kitchen spironolactone (ALDACTONE) 25 MG tablet Take 1 tablet (25 mg total) by mouth  daily.  . traZODone (DESYREL) 50 MG tablet Take 0.5-1 tablets (25-50 mg total) by mouth at bedtime as needed for sleep.    Allergies:   Bactrim [sulfamethoxazole-trimethoprim], Penicillins, and Cleocin [clindamycin hcl]   Social History   Socioeconomic History  . Marital status: Single    Spouse name: Not on file  . Number of children: Not on file  . Years of education: Not on file  . Highest education level: Not on file  Occupational History  . Not on file  Tobacco Use  . Smoking status: Current Some Day Smoker    Packs/day: 0.25    Years: 30.00    Pack years: 7.50  . Smokeless  tobacco: Former Systems developer  . Tobacco comment: 1/4 - 1/2 ppd had quit for 1 year in 2010  Substance and Sexual Activity  . Alcohol use: Yes    Comment: occasional  . Drug use: No    Types: Cocaine, Marijuana  . Sexual activity: Never  Other Topics Concern  . Not on file  Social History Narrative  . Not on file   Social Determinants of Health   Financial Resource Strain:   . Difficulty of Paying Living Expenses: Not on file  Food Insecurity:   . Worried About Charity fundraiser in the Last Year: Not on file  . Ran Out of Food in the Last Year: Not on file  Transportation Needs:   . Lack of Transportation (Medical): Not on file  . Lack of Transportation (Non-Medical): Not on file  Physical Activity:   . Days of Exercise per Week: Not on file  . Minutes of Exercise per Session: Not on file  Stress:   . Feeling of Stress : Not on file  Social Connections:   . Frequency of Communication with Friends and Family: Not on file  . Frequency of Social Gatherings with Friends and Family: Not on file  . Attends Religious Services: Not on file  . Active Member of Clubs or Organizations: Not on file  . Attends Archivist Meetings: Not on file  . Marital Status: Not on file     Family History:  The patient's family history includes Cancer in her mother; Depression in her mother; Diabetes in her mother; Emphysema in her father; Heart disease in her mother; Stroke in her mother.  ROS:   Review of Systems  Constitutional: Positive for malaise/fatigue. Negative for chills, diaphoresis, fever and weight loss.  HENT: Negative for congestion.   Eyes: Negative for discharge and redness.  Respiratory: Negative for cough, hemoptysis, sputum production, shortness of breath and wheezing.        Resolved shortness of breath  Cardiovascular: Negative for chest pain, palpitations, orthopnea, claudication, leg swelling and PND.       Resolved chest discomfort and lower extremity swelling    Gastrointestinal: Negative for abdominal pain, blood in stool, heartburn, melena, nausea and vomiting.  Genitourinary: Negative for hematuria.  Musculoskeletal: Negative for falls and myalgias.  Skin: Negative for rash.  Neurological: Negative for dizziness, tingling, tremors, sensory change, speech change, focal weakness, loss of consciousness and weakness.  Endo/Heme/Allergies: Does not bruise/bleed easily.  Psychiatric/Behavioral: Negative for substance abuse. The patient is not nervous/anxious.   All other systems reviewed and are negative.    EKGs/Labs/Other Studies Reviewed:    Studies reviewed were summarized above. The additional studies were reviewed today:  2D Echo 04/2019: 1. The left ventricle has normal systolic function, with an ejection fraction of 55-60%. The cavity  size was normal. Left ventricular diastolic parameters were normal. No evidence of left ventricular regional wall motion abnormalities.  2. The right ventricle has normal systolic function. The cavity was normal. There is no increase in right ventricular wall thickness. Right ventricular systolic pressure is normal.  3. The mitral valve is grossly normal. There is mild mitral annular calcification present.  4. The aortic valve was not well visualized.  5. The aortic root and ascending aorta are normal in size and structure.  6. The interatrial septum was not well visualized. __________  Leane Call 04/2019:  Abnormal, probably low risk pharmacologic myocardial perfusion stress test.  There is a moderate in size, mild in severity, fixed mid anteroseptal, apical anterior, apical septal, and apical defect that most likely represents artifact (attenuation) but cannot exclude scar.  There is no significant ischemia.  Left ventricular systolic function is mild to moderately reduced (LVEF 50% by Siemens calculation, 38% by QGS) with subtle apical/apical septal hypokinesis.  No significant coronary artery  calcification is seen on the attenuation correction CT. Aortic atherosclerosis is noted in the aortic arch.  The sensitivity and specificity of this study are degraded by the patient's body habitus.  EKG:  EKG is ordered today.  The EKG ordered today demonstrates NSR, 86 bpm, normal axis, nonspecific ST-T changes not significantly changed from prior  Recent Labs: 01/05/2019: ALT 19; Hemoglobin 13.6; Magnesium 2.2; Platelets 289; TSH 4.45 10/16/2019: BUN 10; Creatinine, Ser 0.91; Potassium 3.7; Sodium 142  Recent Lipid Panel    Component Value Date/Time   CHOL 176 03/29/2017 0833   TRIG 99 03/29/2017 0833   HDL 42 (L) 03/29/2017 0833   CHOLHDL 4.2 03/29/2017 0833   VLDL 20 03/29/2017 0833   LDLCALC 114 (H) 03/29/2017 0833    PHYSICAL EXAM:    VS:  BP 122/68 (BP Location: Left Arm, Patient Position: Sitting, Cuff Size: Large)   Pulse 86   Ht 5\' 9"  (1.753 m)   Wt (!) 312 lb (141.5 kg)   SpO2 97%   BMI 46.07 kg/m   BMI: Body mass index is 46.07 kg/m.  Physical Exam  Constitutional: She is oriented to person, place, and time. She appears well-developed and well-nourished.  HENT:  Head: Normocephalic and atraumatic.  Eyes: Right eye exhibits no discharge. Left eye exhibits no discharge.  Neck: No JVD present.  Cardiovascular: Normal rate, regular rhythm, S1 normal, S2 normal and normal heart sounds. Exam reveals no distant heart sounds, no friction rub, no midsystolic click and no opening snap.  No murmur heard. Pulses:      Posterior tibial pulses are 2+ on the right side and 2+ on the left side.  Pulmonary/Chest: Effort normal and breath sounds normal. No respiratory distress. She has no decreased breath sounds. She has no wheezes. She has no rales. She exhibits no tenderness.  Abdominal: Soft. She exhibits no distension. There is no abdominal tenderness.  Musculoskeletal:        General: No edema.     Cervical back: Normal range of motion.  Neurological: She is alert and  oriented to person, place, and time.  Skin: Skin is warm and dry. No cyanosis. Nails show no clubbing.  Psychiatric: She has a normal mood and affect. Her speech is normal and behavior is normal. Judgment and thought content normal.    Wt Readings from Last 3 Encounters:  11/20/19 (!) 312 lb (141.5 kg)  10/29/19 (!) 310 lb (140.6 kg)  10/16/19 (!) 322 lb 12 oz (146.4  kg)     ASSESSMENT & PLAN:   1. HTN: Blood pressure is significantly improved following the addition of spironolactone.  Check BMP today.  For now, she will continue current dose of amlodipine, carvedilol, HCTZ, and spironolactone.  Low-sodium diet recommended.  2. Atypical chest pain/extremity: Resolved.  Overall symptoms are atypical in presentation with recent reassuring Lexiscan Myoview and echo.  Likely exacerbated by accelerated hypertension.  Her dyspnea is likely multifactorial including deconditioning and morbid obesity.  No plans for further ischemic evaluation at this time.  3. Lower extremity swelling: History is consistent with dependent edema/venous insufficiency.  Much improved following initiation of spironolactone in addition to continuation of HCTZ.  Cannot exclude some exacerbation from amlodipine though she is achieving significant improvement in her BP with his medication, and in the setting we will continued at this time.  Check BMP.  Leg elevation recommended.  4. HLD: Most recent LDL of 114 from 03/2017.  5. Tobacco use: Complete cessation is recommended.  6. Chronic low back pain: Refilled ibuprofen x1.  Follow-up with PCP.  Disposition: F/u with Dr. Fletcher Anon or an APP in 3 months.   Medication Adjustments/Labs and Tests Ordered: Current medicines are reviewed at length with the patient today.  Concerns regarding medicines are outlined above. Medication changes, Labs and Tests ordered today are summarized above and listed in the Patient Instructions accessible in Encounters.   Signed, Christell Faith,  PA-C 11/20/2019 11:40 AM     [facility] - [address] [place], [place] [address] [phone]

## 2019-11-20 ENCOUNTER — Other Ambulatory Visit: Payer: Self-pay

## 2019-11-20 ENCOUNTER — Ambulatory Visit (INDEPENDENT_AMBULATORY_CARE_PROVIDER_SITE_OTHER): Payer: PRIVATE HEALTH INSURANCE | Admitting: Physician Assistant

## 2019-11-20 ENCOUNTER — Encounter: Payer: Self-pay | Admitting: Physician Assistant

## 2019-11-20 VITALS — BP 122/68 | HR 86 | Ht 69.0 in | Wt 312.0 lb

## 2019-11-20 DIAGNOSIS — M545 Low back pain, unspecified: Secondary | ICD-10-CM

## 2019-11-20 DIAGNOSIS — I10 Essential (primary) hypertension: Secondary | ICD-10-CM

## 2019-11-20 DIAGNOSIS — E785 Hyperlipidemia, unspecified: Secondary | ICD-10-CM

## 2019-11-20 DIAGNOSIS — R0789 Other chest pain: Secondary | ICD-10-CM | POA: Diagnosis not present

## 2019-11-20 DIAGNOSIS — R600 Localized edema: Secondary | ICD-10-CM | POA: Diagnosis not present

## 2019-11-20 DIAGNOSIS — R0600 Dyspnea, unspecified: Secondary | ICD-10-CM | POA: Diagnosis not present

## 2019-11-20 DIAGNOSIS — Z720 Tobacco use: Secondary | ICD-10-CM

## 2019-11-20 DIAGNOSIS — R0609 Other forms of dyspnea: Secondary | ICD-10-CM

## 2019-11-20 MED ORDER — IBUPROFEN 800 MG PO TABS: 800.0000 mg | ORAL_TABLET | Freq: Three times a day (TID) | ORAL | 0 refills | Status: DC | PRN

## 2019-11-20 NOTE — Patient Instructions (Signed)
Medication Instructions:  Your physician recommends that you continue on your current medications as directed. Please refer to the Current Medication list given to you today.  *If you need a refill on your cardiac medications before your next appointment, please call your pharmacy*  Lab Work: Your physician recommends that you return for lab work in: [place].   If you have labs (blood work) drawn today and your tests are completely normal, you will receive your results only by: Marland Kitchen MyChart Message (if you have MyChart) OR . A paper copy in the mail If you have any lab test that is abnormal or we need to change your treatment, we will call you to review the results.  Testing/Procedures: none  Follow-Up: At [facility], you and your health needs are our priority.  As part of our continuing mission to provide you with exceptional heart care, we have created designated Provider Care Teams.  These Care Teams include your primary Cardiologist (physician) and Advanced Practice Providers (APPs -  Physician Assistants and Nurse Practitioners) who all work together to provide you with the care you need, when you need it.  Your next appointment:   3 month(s)  The format for your next appointment:   In Person  Provider:    You may see Kathlyn Sacramento, MD or one of the following Advanced Practice Providers on your designated Care Team:    Murray Hodgkins, NP  Christell Faith, PA-C  Marrianne Mood, PA-C

## 2019-11-21 ENCOUNTER — Telehealth: Payer: Self-pay

## 2019-11-21 LAB — BASIC METABOLIC PANEL
BUN/Creatinine Ratio: 10 (ref 9–23)
BUN: 10 mg/dL (ref 6–24)
CO2: 23 mmol/L (ref 20–29)
Calcium: 9.7 mg/dL (ref 8.7–10.2)
Chloride: 102 mmol/L (ref 96–106)
Creatinine, Ser: 1.02 mg/dL — ABNORMAL HIGH (ref 0.57–1.00)
GFR calc Af Amer: 71 mL/min/{1.73_m2} (ref >59)
GFR calc non Af Amer: 62 mL/min/{1.73_m2} (ref >59)
Glucose: 179 mg/dL — ABNORMAL HIGH (ref 65–99)
Potassium: 3.7 mmol/L (ref 3.5–5.2)
Sodium: 141 mmol/L (ref 134–144)

## 2019-11-21 NOTE — Telephone Encounter (Signed)
Call to patient to discuss lab results and POC. Pt verbalized understanding and no further orders are needed at this time.   Advised pt to call for any further questions or concerns.

## 2019-11-21 NOTE — Telephone Encounter (Signed)
-----   Message from Rise Mu, PA-C sent at 11/21/2019  7:11 AM EST ----- Renal function minimally elevated so overall consistent with prior readings.  Potassium stable on current therapy.  Random blood sugar is elevated.  She should follow-up with PCP for repeat testing.

## 2020-01-18 ENCOUNTER — Other Ambulatory Visit: Payer: Self-pay | Admitting: Family Medicine

## 2020-01-18 DIAGNOSIS — I10 Essential (primary) hypertension: Secondary | ICD-10-CM

## 2020-02-05 ENCOUNTER — Other Ambulatory Visit: Payer: Self-pay | Admitting: Cardiovascular Disease

## 2020-02-05 MED ORDER — SPIRONOLACTONE 25 MG PO TABS: 25.0000 mg | ORAL_TABLET | Freq: Every day | ORAL | 0 refills | Status: DC

## 2020-02-05 NOTE — Telephone Encounter (Signed)
*  STAT* If patient is at the pharmacy, call can be transferred to refill team.   1. Which medications need to be refilled? (please list name of each medication and dose if known)    Spironolactone 25 mg po q d   2. Which pharmacy/location (including street and city if local pharmacy) is medication to be sent to? [facility] [address]   3. Do they need a 30 day or 90 day supply?  [place]

## 2020-02-05 NOTE — Telephone Encounter (Signed)
Requested Prescriptions   Signed Prescriptions Disp Refills   spironolactone (ALDACTONE) 25 MG tablet 90 tablet 0    Sig: Take 1 tablet (25 mg total) by mouth daily.    Authorizing Provider: ARIDA, MUHAMMAD A    Ordering User: NEWCOMER MCCLAIN, Allure Greaser L    

## 2020-02-19 ENCOUNTER — Ambulatory Visit: Payer: PRIVATE HEALTH INSURANCE | Admitting: Cardiovascular Disease

## 2020-02-22 ENCOUNTER — Ambulatory Visit: Payer: PRIVATE HEALTH INSURANCE | Admitting: Family

## 2020-02-25 NOTE — Progress Notes (Deleted)
Cardiology Office Note    Date:  02/25/2020   ID:  Shannon, Bryan 07-12-1961, MRN [id number]  PCP:  Mikey College, NP (Inactive)  Cardiologist:  Kathlyn Sacramento, MD  Electrophysiologist:  None   Chief Complaint: ***  History of Present Illness:   Shannon Bryan is a 59 y.o. female with history of HTN, mild HLD, tobacco use, and obesity who presents for follow up of ***.   She was seen in 12/2018 with worsening exertional dyspnea and chest pain in the setting of uncontrolled hypertension with BP as high as A999333 mmHg systolic. She was on HCTZ at that time. Coreg was added. She underwent Lexiscan Myoview in 04/2019 which showed a fixed anterior wall defect likely secondary to breast attenuation artifact. EF estimation was inaccurate. In this setting, she underwent echo in 04/2019, which showed an EF of 55-60% with no significant valvular abnormalities or pulmonary hypertension.  She was under increased stress in 05/2019 with her brother having passed away with COVID-19 and having had multiple family members sick with the virus. She was seen in the office in 10/2019 for follow up and had been out of her amlodipine. She noted pedal swelling that was worse throughout the day and improved with elevation and in the mornings. She also noted some right-sided chest pain and back pain with increased activity and stress. BP was poorly controlled at 150/90. She was started on spironolactone and amlodipine was resumed.  She was last seen in the office on 11/20/2019 and was doing well from a cardiac perspective.  Since initiation of spironolactone her lower extremity swelling had resolved.  Her weight was down 10 pounds when compared to her office visit the month prior.  Her chest discomfort and dyspnea had resolved.  Her blood pressure was well controlled at 122/68.  Follow-up labs showed a stable kidney function and potassium as outlined below.  ***   Labs independently reviewed: 11/2019 - BUN  10, SCr 1.02, potassium 3.7 12/2018 - A1c 6.3, HGB 13.6, PLT 289, magnesium 2.2, albumin 3.8, AT/ALT normal, TSH normal 03/2017 - TC 176, TG 99, HDL 42, LDL 114  Past Medical History:  Diagnosis Date  . Asthma   . Blood clot in vein   . Chronic kidney disease   . Hyperlipidemia   . Hypertension   . Kidney stone     Past Surgical History:  Procedure Laterality Date  . ABDOMINAL HYSTERECTOMY    . CHOLECYSTECTOMY    . ROTATOR CUFF REPAIR Right   . TUBAL LIGATION      Current Medications: No outpatient medications have been marked as taking for the 02/26/20 encounter (Appointment) with Rise Mu, PA-C.    Allergies:   Bactrim [sulfamethoxazole-trimethoprim], Penicillins, and Cleocin [clindamycin hcl]   Social History   Socioeconomic History  . Marital status: Single    Spouse name: Not on file  . Number of children: Not on file  . Years of education: Not on file  . Highest education level: Not on file  Occupational History  . Not on file  Tobacco Use  . Smoking status: Current Some Day Smoker    Packs/day: 0.25    Years: 30.00    Pack years: 7.50  . Smokeless tobacco: Former Systems developer  . Tobacco comment: 1/4 - 1/2 ppd had quit for 1 year in 2010  Substance and Sexual Activity  . Alcohol use: Yes    Comment: occasional  . Drug use: No    Types: Cocaine, Marijuana  .  Sexual activity: Never  Other Topics Concern  . Not on file  Social History Narrative  . Not on file   Social Determinants of Health   Financial Resource Strain:   . Difficulty of Paying Living Expenses:   Food Insecurity:   . Worried About Charity fundraiser in the Last Year:   . Arboriculturist in the Last Year:   Transportation Needs:   . Film/video editor (Medical):   Marland Kitchen Lack of Transportation (Non-Medical):   Physical Activity:   . Days of Exercise per Week:   . Minutes of Exercise per Session:   Stress:   . Feeling of Stress :   Social Connections:   . Frequency of Communication  with Friends and Family:   . Frequency of Social Gatherings with Friends and Family:   . Attends Religious Services:   . Active Member of Clubs or Organizations:   . Attends Archivist Meetings:   Marland Kitchen Marital Status:      Family History:  The patient's family history includes Cancer in her mother; Depression in her mother; Diabetes in her mother; Emphysema in her father; Heart disease in her mother; Stroke in her mother.  ROS:   ROS   EKGs/Labs/Other Studies Reviewed:    Studies reviewed were summarized above. The additional studies were reviewed today:  2D echo 04/2019: 1. The left ventricle has normal systolic function, with an ejection  fraction of 55-60%. The cavity size was normal. Left ventricular diastolic  parameters were normal. No evidence of left ventricular regional wall  motion abnormalities.  2. The right ventricle has normal systolic function. The cavity was  normal. There is no increase in right ventricular wall thickness. Right  ventricular systolic pressure is normal.  3. The mitral valve is grossly normal. There is mild mitral annular  calcification present.  4. The aortic valve was not well visualized.  5. The aortic root and ascending aorta are normal in size and structure.  6. The interatrial septum was not well visualized.  __________  Carlton Adam MPI 04/2019:  Abnormal, probably low risk pharmacologic myocardial perfusion stress test.  There is a moderate in size, mild in severity, fixed mid anteroseptal, apical anterior, apical septal, and apical defect that most likely represents artifact (attenuation) but cannot exclude scar.  There is no significant ischemia.  Left ventricular systolic function is mild to moderately reduced (LVEF 50% by Siemens calculation, 38% by QGS) with subtle apical/apical septal hypokinesis.  No significant coronary artery calcification is seen on the attenuation correction CT. Aortic atherosclerosis is noted in  the aortic arch.  The sensitivity and specificity of this study are degraded by the patient's body habitus.   EKG:  EKG is ordered today.  The EKG ordered today demonstrates ***  Recent Labs: 11/20/2019: BUN 10; Creatinine, Ser 1.02; Potassium 3.7; Sodium 141  Recent Lipid Panel    Component Value Date/Time   CHOL 176 03/29/2017 0833   TRIG 99 03/29/2017 0833   HDL 42 (L) 03/29/2017 0833   CHOLHDL 4.2 03/29/2017 0833   VLDL 20 03/29/2017 0833   LDLCALC 114 (H) 03/29/2017 [id number]    PHYSICAL EXAM:    VS:  There were no vitals taken for this visit.  BMI: There is no height or weight on file to calculate BMI.  Physical Exam  Wt Readings from Last 3 Encounters:  11/20/19 (!) 312 lb (141.5 kg)  10/29/19 (!) 310 lb (140.6 kg)  10/16/19 (!) 322 lb 12  oz (146.4 kg)     ASSESSMENT & PLAN:   1. ***  Disposition: F/u with Dr. Fletcher Anon or an APP in ***.   Medication Adjustments/Labs and Tests Ordered: Current medicines are reviewed at length with the patient today.  Concerns regarding medicines are outlined above. Medication changes, Labs and Tests ordered today are summarized above and listed in the Patient Instructions accessible in Encounters.   Signed, Christell Faith, PA-C 02/25/2020 8:01 AM     [facility] - [place] [address] [place] [address] [place], [place] [address] [phone]

## 2020-02-26 ENCOUNTER — Ambulatory Visit: Payer: PRIVATE HEALTH INSURANCE | Admitting: Physician Assistant

## 2020-02-27 ENCOUNTER — Encounter: Payer: Self-pay | Admitting: Physician Assistant

## 2020-03-04 ENCOUNTER — Encounter (HOSPITAL_BASED_OUTPATIENT_CLINIC_OR_DEPARTMENT_OTHER): Payer: Self-pay | Admitting: Plastic Surgery

## 2020-03-07 ENCOUNTER — Other Ambulatory Visit (HOSPITAL_COMMUNITY)
Admission: RE | Admit: 2020-03-07 | Discharge: 2020-03-07 | Disposition: A | Discharge status: Home / Self Care | Payer: PRIVATE HEALTH INSURANCE | Source: Ambulatory Visit | Attending: Plastic Surgery | Admitting: Plastic Surgery

## 2020-03-07 ENCOUNTER — Encounter (HOSPITAL_BASED_OUTPATIENT_CLINIC_OR_DEPARTMENT_OTHER)
Admission: RE | Admit: 2020-03-07 | Discharge: 2020-03-07 | Disposition: A | Discharge status: Home / Self Care | Payer: PRIVATE HEALTH INSURANCE | Source: Ambulatory Visit | Attending: Plastic Surgery | Admitting: Plastic Surgery

## 2020-03-07 DIAGNOSIS — Z01812 Encounter for preprocedural laboratory examination: Secondary | ICD-10-CM | POA: Diagnosis present

## 2020-03-07 DIAGNOSIS — Z20822 Contact with and (suspected) exposure to covid-19: Secondary | ICD-10-CM | POA: Insufficient documentation

## 2020-03-07 LAB — BASIC METABOLIC PANEL
Anion gap: 9 (ref 5–15)
BUN: 9 mg/dL (ref 6–20)
CO2: 25 mmol/L (ref 22–32)
Calcium: 9.2 mg/dL (ref 8.9–10.3)
Chloride: 104 mmol/L (ref 98–111)
Creatinine, Ser: 0.86 mg/dL (ref 0.44–1.00)
GFR calc Af Amer: >60 mL/min (ref >60)
GFR calc non Af Amer: >60 mL/min (ref >60)
Glucose, Bld: 143 mg/dL — ABNORMAL HIGH (ref 70–99)
Potassium: 4 mmol/L (ref 3.5–5.1)
Sodium: 138 mmol/L (ref 135–145)

## 2020-03-07 NOTE — Progress Notes (Signed)
BMI- 47.02, anesthesia consult per Dr. Sabra Heck, will proceed with surgery as scheduled.

## 2020-03-08 LAB — SARS CORONAVIRUS 2 (TAT 6-24 HRS): SARS Coronavirus 2: NEGATIVE

## 2020-03-10 ENCOUNTER — Ambulatory Visit: Payer: Self-pay | Admitting: Plastic Surgery

## 2020-03-11 ENCOUNTER — Encounter (HOSPITAL_BASED_OUTPATIENT_CLINIC_OR_DEPARTMENT_OTHER): Payer: Self-pay | Admitting: Plastic Surgery

## 2020-03-11 ENCOUNTER — Ambulatory Visit (HOSPITAL_BASED_OUTPATIENT_CLINIC_OR_DEPARTMENT_OTHER): Payer: Managed Care, Other (non HMO) | Admitting: Certified Registered"

## 2020-03-11 ENCOUNTER — Other Ambulatory Visit: Payer: Self-pay

## 2020-03-11 ENCOUNTER — Ambulatory Visit (HOSPITAL_BASED_OUTPATIENT_CLINIC_OR_DEPARTMENT_OTHER)
Admission: RE | Admit: 2020-03-11 | Discharge: 2020-03-11 | Disposition: A | Discharge status: Home / Self Care | Payer: Managed Care, Other (non HMO) | Attending: Plastic Surgery | Admitting: Plastic Surgery

## 2020-03-11 ENCOUNTER — Encounter (HOSPITAL_BASED_OUTPATIENT_CLINIC_OR_DEPARTMENT_OTHER)
Admission: RE | Disposition: A | Discharge status: Home / Self Care | Payer: Self-pay | Source: Home / Self Care | Attending: Plastic Surgery

## 2020-03-11 DIAGNOSIS — N62 Hypertrophy of breast: Secondary | ICD-10-CM | POA: Insufficient documentation

## 2020-03-11 DIAGNOSIS — R0600 Dyspnea, unspecified: Secondary | ICD-10-CM | POA: Insufficient documentation

## 2020-03-11 DIAGNOSIS — M160 Bilateral primary osteoarthritis of hip: Secondary | ICD-10-CM | POA: Insufficient documentation

## 2020-03-11 DIAGNOSIS — R0789 Other chest pain: Secondary | ICD-10-CM | POA: Insufficient documentation

## 2020-03-11 DIAGNOSIS — Z882 Allergy status to sulfonamides status: Secondary | ICD-10-CM | POA: Insufficient documentation

## 2020-03-11 DIAGNOSIS — F17210 Nicotine dependence, cigarettes, uncomplicated: Secondary | ICD-10-CM | POA: Insufficient documentation

## 2020-03-11 DIAGNOSIS — Z6841 Body Mass Index (BMI) 40.0 and over, adult: Secondary | ICD-10-CM | POA: Insufficient documentation

## 2020-03-11 DIAGNOSIS — Z9049 Acquired absence of other specified parts of digestive tract: Secondary | ICD-10-CM | POA: Insufficient documentation

## 2020-03-11 DIAGNOSIS — M542 Cervicalgia: Secondary | ICD-10-CM | POA: Insufficient documentation

## 2020-03-11 DIAGNOSIS — Z79899 Other long term (current) drug therapy: Secondary | ICD-10-CM | POA: Insufficient documentation

## 2020-03-11 DIAGNOSIS — Z90710 Acquired absence of both cervix and uterus: Secondary | ICD-10-CM | POA: Insufficient documentation

## 2020-03-11 DIAGNOSIS — I10 Essential (primary) hypertension: Secondary | ICD-10-CM | POA: Insufficient documentation

## 2020-03-11 DIAGNOSIS — J45909 Unspecified asthma, uncomplicated: Secondary | ICD-10-CM | POA: Insufficient documentation

## 2020-03-11 DIAGNOSIS — Z7982 Long term (current) use of aspirin: Secondary | ICD-10-CM | POA: Insufficient documentation

## 2020-03-11 DIAGNOSIS — R519 Headache, unspecified: Secondary | ICD-10-CM | POA: Insufficient documentation

## 2020-03-11 DIAGNOSIS — Z880 Allergy status to penicillin: Secondary | ICD-10-CM | POA: Insufficient documentation

## 2020-03-11 DIAGNOSIS — M549 Dorsalgia, unspecified: Secondary | ICD-10-CM | POA: Insufficient documentation

## 2020-03-11 HISTORY — PX: BREAST RECONSTRUCTION WITH MASTOPLASTY: SHX6752

## 2020-03-11 SURGERY — BREAST RECONSTRUCTION WITH MASTOPLASTY
Anesthesia: General | Site: Breast | Laterality: Bilateral

## 2020-03-11 MED ORDER — ROCURONIUM BROMIDE 100 MG/10ML IV SOLN
INTRAVENOUS | Status: DC | PRN
  Administered 2020-03-11: 80 mg via INTRAVENOUS
  Administered 2020-03-11: 20 mg via INTRAVENOUS

## 2020-03-11 MED ORDER — FENTANYL CITRATE (PF) 100 MCG/2ML IJ SOLN
INTRAMUSCULAR | Status: AC
  Filled 2020-03-11: qty 2

## 2020-03-11 MED ORDER — MIDAZOLAM HCL 2 MG/2ML IJ SOLN
INTRAMUSCULAR | Status: AC
  Filled 2020-03-11: qty 2

## 2020-03-11 MED ORDER — CHLORHEXIDINE GLUCONATE CLOTH 2 % EX PADS: 6.0000 | MEDICATED_PAD | Freq: Once | CUTANEOUS | Status: DC

## 2020-03-11 MED ORDER — CEFAZOLIN SODIUM-DEXTROSE 1-4 GM/50ML-% IV SOLN
INTRAVENOUS | Status: AC
  Filled 2020-03-11: qty 50

## 2020-03-11 MED ORDER — FENTANYL CITRATE (PF) 100 MCG/2ML IJ SOLN
INTRAMUSCULAR | Status: DC | PRN
  Administered 2020-03-11: 100 ug via INTRAVENOUS
  Administered 2020-03-11 (×2): 50 ug via INTRAVENOUS
  Administered 2020-03-11: 100 ug via INTRAVENOUS

## 2020-03-11 MED ORDER — CEFAZOLIN SODIUM-DEXTROSE 2-4 GM/100ML-% IV SOLN
2.0000 g | INTRAVENOUS | Status: AC
  Administered 2020-03-11: 08:00:00 3 g via INTRAVENOUS

## 2020-03-11 MED ORDER — DEXAMETHASONE SODIUM PHOSPHATE 10 MG/ML IJ SOLN
INTRAMUSCULAR | Status: AC
  Filled 2020-03-11: qty 4

## 2020-03-11 MED ORDER — SODIUM CHLORIDE (PF) 0.9 % IJ SOLN
INTRAMUSCULAR | Status: DC | PRN
  Administered 2020-03-11: 90 mL

## 2020-03-11 MED ORDER — SUGAMMADEX SODIUM 200 MG/2ML IV SOLN
INTRAVENOUS | Status: DC | PRN
  Administered 2020-03-11: 200 mg via INTRAVENOUS

## 2020-03-11 MED ORDER — BUPIVACAINE HCL (PF) 0.5 % IJ SOLN
INTRAMUSCULAR | Status: AC
  Filled 2020-03-11: qty 30

## 2020-03-11 MED ORDER — ROCURONIUM BROMIDE 10 MG/ML (PF) SYRINGE
PREFILLED_SYRINGE | INTRAVENOUS | Status: AC
  Filled 2020-03-11: qty 20

## 2020-03-11 MED ORDER — SUGAMMADEX SODIUM 500 MG/5ML IV SOLN
INTRAVENOUS | Status: AC
  Filled 2020-03-11: qty 5

## 2020-03-11 MED ORDER — FENTANYL CITRATE (PF) 100 MCG/2ML IJ SOLN
25.0000 ug | INTRAMUSCULAR | Status: DC | PRN
  Administered 2020-03-11 (×2): 50 ug via INTRAVENOUS

## 2020-03-11 MED ORDER — EPHEDRINE SULFATE 50 MG/ML IJ SOLN
INTRAMUSCULAR | Status: DC | PRN
  Administered 2020-03-11: 10 mg via INTRAVENOUS

## 2020-03-11 MED ORDER — SODIUM CHLORIDE (PF) 0.9 % IJ SOLN
INTRAMUSCULAR | Status: DC | PRN
  Administered 2020-03-11: 60 mL

## 2020-03-11 MED ORDER — ONDANSETRON HCL 4 MG/2ML IJ SOLN
INTRAMUSCULAR | Status: DC | PRN
  Administered 2020-03-11: 4 mg via INTRAVENOUS

## 2020-03-11 MED ORDER — ONDANSETRON HCL 4 MG/2ML IJ SOLN
INTRAMUSCULAR | Status: AC
  Filled 2020-03-11: qty 4

## 2020-03-11 MED ORDER — LACTATED RINGERS IV SOLN: INTRAVENOUS | Status: DC

## 2020-03-11 MED ORDER — SCOPOLAMINE 1 MG/3DAYS TD PT72
MEDICATED_PATCH | TRANSDERMAL | Status: DC | PRN
  Administered 2020-03-11: 1 via TRANSDERMAL

## 2020-03-11 MED ORDER — CEFAZOLIN SODIUM-DEXTROSE 2-4 GM/100ML-% IV SOLN
INTRAVENOUS | Status: AC
  Filled 2020-03-11: qty 100

## 2020-03-11 MED ORDER — PROPOFOL 500 MG/50ML IV EMUL
INTRAVENOUS | Status: AC
  Filled 2020-03-11: qty 100

## 2020-03-11 MED ORDER — OXYCODONE HCL 5 MG PO TABS: 5.0000 mg | ORAL_TABLET | Freq: Once | ORAL | Status: DC | PRN

## 2020-03-11 MED ORDER — PROPOFOL 500 MG/50ML IV EMUL
INTRAVENOUS | Status: DC | PRN
  Administered 2020-03-11: 25 ug/kg/min via INTRAVENOUS

## 2020-03-11 MED ORDER — PROPOFOL 10 MG/ML IV BOLUS
INTRAVENOUS | Status: AC
  Filled 2020-03-11: qty 20

## 2020-03-11 MED ORDER — BUPIVACAINE HCL (PF) 0.25 % IJ SOLN
INTRAMUSCULAR | Status: DC | PRN
  Administered 2020-03-11: 30 mL

## 2020-03-11 MED ORDER — EPHEDRINE 5 MG/ML INJ
INTRAVENOUS | Status: AC
  Filled 2020-03-11: qty 10

## 2020-03-11 MED ORDER — LIDOCAINE 2% (20 MG/ML) 5 ML SYRINGE
INTRAMUSCULAR | Status: AC
  Filled 2020-03-11: qty 15

## 2020-03-11 MED ORDER — MIDAZOLAM HCL 5 MG/5ML IJ SOLN
INTRAMUSCULAR | Status: DC | PRN
  Administered 2020-03-11: 2 mg via INTRAVENOUS

## 2020-03-11 MED ORDER — LIDOCAINE-EPINEPHRINE 1 %-1:100000 IJ SOLN
INTRAMUSCULAR | Status: DC | PRN
  Administered 2020-03-11: 5 mL via INTRADERMAL

## 2020-03-11 MED ORDER — BUPIVACAINE LIPOSOME 1.3 % IJ SUSP
INTRAMUSCULAR | Status: DC | PRN
  Administered 2020-03-11: 20 mL

## 2020-03-11 MED ORDER — ONDANSETRON HCL 4 MG/2ML IJ SOLN
INTRAMUSCULAR | Status: AC
  Filled 2020-03-11: qty 10

## 2020-03-11 MED ORDER — BACITRACIN ZINC 500 UNIT/GM EX OINT
TOPICAL_OINTMENT | CUTANEOUS | Status: DC | PRN
  Administered 2020-03-11: 1 via TOPICAL

## 2020-03-11 MED ORDER — ONDANSETRON HCL 4 MG/2ML IJ SOLN: 4.0000 mg | Freq: Once | INTRAMUSCULAR | Status: DC | PRN

## 2020-03-11 MED ORDER — LIDOCAINE-EPINEPHRINE 1 %-1:100000 IJ SOLN
INTRAMUSCULAR | Status: AC
  Filled 2020-03-11: qty 1

## 2020-03-11 MED ORDER — PHENYLEPHRINE 40 MCG/ML (10ML) SYRINGE FOR IV PUSH (FOR BLOOD PRESSURE SUPPORT)
PREFILLED_SYRINGE | INTRAVENOUS | Status: AC
  Filled 2020-03-11: qty 10

## 2020-03-11 MED ORDER — LIDOCAINE HCL (CARDIAC) PF 100 MG/5ML IV SOSY
PREFILLED_SYRINGE | INTRAVENOUS | Status: DC | PRN
  Administered 2020-03-11: 60 mg via INTRAVENOUS

## 2020-03-11 MED ORDER — SODIUM CHLORIDE (PF) 0.9 % IJ SOLN
INTRAMUSCULAR | Status: AC
  Filled 2020-03-11: qty 10

## 2020-03-11 MED ORDER — BUPIVACAINE LIPOSOME 1.3 % IJ SUSP
INTRAMUSCULAR | Status: AC
  Filled 2020-03-11: qty 20

## 2020-03-11 MED ORDER — PROPOFOL 10 MG/ML IV BOLUS
INTRAVENOUS | Status: DC | PRN
  Administered 2020-03-11: 200 mg via INTRAVENOUS

## 2020-03-11 MED ORDER — DEXAMETHASONE SODIUM PHOSPHATE 4 MG/ML IJ SOLN
INTRAMUSCULAR | Status: DC | PRN
  Administered 2020-03-11: 10 mg via INTRAVENOUS

## 2020-03-11 MED ORDER — OXYCODONE HCL 5 MG/5ML PO SOLN: 5.0000 mg | Freq: Once | ORAL | Status: DC | PRN

## 2020-03-11 MED ORDER — DEXMEDETOMIDINE HCL IN NACL 200 MCG/50ML IV SOLN
INTRAVENOUS | Status: AC
  Filled 2020-03-11: qty 50

## 2020-03-11 MED ORDER — BUPIVACAINE HCL (PF) 0.25 % IJ SOLN
INTRAMUSCULAR | Status: AC
  Filled 2020-03-11: qty 30

## 2020-03-11 SURGICAL SUPPLY — 68 items
BAG DECANTER FOR FLEXI CONT (MISCELLANEOUS) ×3 IMPLANT
BLADE KNIFE PERSONA 10 (BLADE) ×12 IMPLANT
BLADE KNIFE PERSONA 15 (BLADE) ×11 IMPLANT
BNDG GAUZE ELAST 4 BULKY (GAUZE/BANDAGES/DRESSINGS) ×6 IMPLANT
CANISTER SUCT 1200ML W/VALVE (MISCELLANEOUS) ×3 IMPLANT
CAP BOUFFANT 24 BLUE NURSES (PROTECTIVE WEAR) ×3 IMPLANT
CLOSURE WOUND 1/2 X4 (GAUZE/BANDAGES/DRESSINGS) ×2
COVER BACK TABLE 60X90IN (DRAPES) ×3 IMPLANT
COVER MAYO STAND STRL (DRAPES) ×3 IMPLANT
COVER WAND RF STERILE (DRAPES) IMPLANT
DECANTER SPIKE VIAL GLASS SM (MISCELLANEOUS) ×6 IMPLANT
DRAIN CHANNEL 10F 3/8 F FF (DRAIN) ×6 IMPLANT
DRAPE LAPAROSCOPIC ABDOMINAL (DRAPES) ×3 IMPLANT
DRSG EMULSION OIL 3X3 NADH (GAUZE/BANDAGES/DRESSINGS) ×6 IMPLANT
DRSG PAD ABDOMINAL 8X10 ST (GAUZE/BANDAGES/DRESSINGS) ×6 IMPLANT
ELECT REM PT RETURN 9FT ADLT (ELECTROSURGICAL) ×3
ELECTRODE REM PT RTRN 9FT ADLT (ELECTROSURGICAL) ×1 IMPLANT
EVACUATOR SILICONE 100CC (DRAIN) ×6 IMPLANT
FILTER 7/8 IN (FILTER) IMPLANT
GAUZE SPONGE 4X4 12PLY STRL (GAUZE/BANDAGES/DRESSINGS) ×8 IMPLANT
GLOVE BIO SURGEON STRL SZ 6.5 (GLOVE) ×3 IMPLANT
GLOVE BIO SURGEON STRL SZ7 (GLOVE) ×5 IMPLANT
GLOVE BIO SURGEONS STRL SZ 6.5 (GLOVE) ×3
GLOVE BIOGEL PI IND STRL 6.5 (GLOVE) IMPLANT
GLOVE BIOGEL PI IND STRL 7.0 (GLOVE) IMPLANT
GLOVE BIOGEL PI INDICATOR 6.5 (GLOVE) ×8
GLOVE BIOGEL PI INDICATOR 7.0 (GLOVE) ×4
GLOVE ECLIPSE 6.5 STRL STRAW (GLOVE) ×2 IMPLANT
GOWN STRL REUS W/ TWL LRG LVL3 (GOWN DISPOSABLE) ×2 IMPLANT
GOWN STRL REUS W/TWL LRG LVL3 (GOWN DISPOSABLE) ×12
IV NS 250ML (IV SOLUTION) ×3
IV NS 250ML BAXH (IV SOLUTION) ×1 IMPLANT
NDL HYPO 25X1 1.5 SAFETY (NEEDLE) ×3 IMPLANT
NDL KEITH (NEEDLE) IMPLANT
NDL SAFETY ECLIPSE 18X1.5 (NEEDLE) ×1 IMPLANT
NDL SPNL 18GX3.5 QUINCKE PK (NEEDLE) ×1 IMPLANT
NEEDLE HYPO 18GX1.5 SHARP (NEEDLE) ×3
NEEDLE HYPO 25X1 1.5 SAFETY (NEEDLE) ×9 IMPLANT
NEEDLE KEITH (NEEDLE) IMPLANT
NEEDLE SPNL 18GX3.5 QUINCKE PK (NEEDLE) ×3 IMPLANT
NS IRRIG 1000ML POUR BTL (IV SOLUTION) ×6 IMPLANT
PENCIL SMOKE EVACUATOR (MISCELLANEOUS) ×3 IMPLANT
PIN SAFETY STERILE (MISCELLANEOUS) ×3 IMPLANT
SCRUB TECHNI CARE 4 OZ NO DYE (MISCELLANEOUS) ×3 IMPLANT
SET BASIN DAY SURGERY F.S. (CUSTOM PROCEDURE TRAY) ×3 IMPLANT
SLEEVE SCD COMPRESS KNEE MED (MISCELLANEOUS) ×3 IMPLANT
SPONGE LAP 18X18 RF (DISPOSABLE) ×10 IMPLANT
STAPLER VISISTAT 35W (STAPLE) ×6 IMPLANT
STRIP CLOSURE SKIN 1/2X4 (GAUZE/BANDAGES/DRESSINGS) ×4 IMPLANT
SUT ETHILON 3 0 PS 1 (SUTURE) ×3 IMPLANT
SUT GORETEX TH-18 36 INCH (SUTURE) IMPLANT
SUT MNCRL AB 3-0 PS2 18 (SUTURE) ×10 IMPLANT
SUT MNCRL AB 4-0 PS2 18 (SUTURE) ×6 IMPLANT
SUT PROLENE 2 0 CT2 30 (SUTURE) ×3 IMPLANT
SUT PROLENE 3 0 PS 1 (SUTURE) ×6 IMPLANT
SUT VLOC 90 P-14 23 (SUTURE) ×6 IMPLANT
SYR BULB IRRIG 60ML STRL (SYRINGE) ×8 IMPLANT
SYR CONTROL 10ML LL (SYRINGE) ×6 IMPLANT
TAPE MEASURE VINYL STERILE (MISCELLANEOUS) ×3 IMPLANT
TOWEL GREEN STERILE FF (TOWEL DISPOSABLE) ×9 IMPLANT
TRAY DSU PREP LF (CUSTOM PROCEDURE TRAY) ×3 IMPLANT
TRAY FOL W/BAG SLVR 16FR STRL (SET/KITS/TRAYS/PACK) ×1 IMPLANT
TRAY FOLEY W/BAG SLVR 14FR LF (SET/KITS/TRAYS/PACK) ×2 IMPLANT
TRAY FOLEY W/BAG SLVR 16FR LF (SET/KITS/TRAYS/PACK)
TUBE CONNECTING 20'X1/4 (TUBING) ×2
TUBE CONNECTING 20X1/4 (TUBING) ×3 IMPLANT
UNDERPAD 30X36 HEAVY ABSORB (UNDERPADS AND DIAPERS) ×6 IMPLANT
YANKAUER SUCT BULB TIP NO VENT (SUCTIONS) ×3 IMPLANT

## 2020-03-11 NOTE — Anesthesia Procedure Notes (Signed)
Procedure Name: Intubation Date/Time: 03/11/2020 7:56 AM Performed by: Signe Colt, CRNA Pre-anesthesia Checklist: Patient identified, Emergency Drugs available, Suction available and Patient being monitored Patient Re-evaluated:Patient Re-evaluated prior to induction Oxygen Delivery Method: Circle system utilized Preoxygenation: Pre-oxygenation with 100% oxygen Induction Type: IV induction Ventilation: Mask ventilation without difficulty Laryngoscope Size: Mac and 3 Grade View: Grade II Tube type: Oral Tube size: 7.0 mm Number of attempts: 1 Airway Equipment and Method: Stylet and Oral airway Placement Confirmation: ETT inserted through vocal cords under direct vision,  positive ETCO2 and breath sounds checked- equal and bilateral Secured at: 22 cm Tube secured with: Tape Dental Injury: Teeth and Oropharynx as per pre-operative assessment

## 2020-03-11 NOTE — H&P (Signed)
  H&P faxed to surgical center.  -History and Physical Reviewed  -Patient has been re-examined  -No change in the plan of care  Shannon Bryan A    

## 2020-03-11 NOTE — Transfer of Care (Signed)
Immediate Anesthesia Transfer of Care Note  Patient: Shannon Bryan  Procedure(s) Performed: BREAST RECONSTRUCTION WITH MASTOPLASTY (Bilateral Breast)  Patient Location: PACU  Anesthesia Type:General  Level of Consciousness: drowsy and patient cooperative  Airway & Oxygen Therapy: Patient Spontanous Breathing and Patient connected to face mask oxygen  Post-op Assessment: Report given to RN and Post -op Vital signs reviewed and stable  Post vital signs: Reviewed and stable  Last Vitals:  Vitals Value Taken Time  BP    Temp    Pulse    Resp 20 03/11/20 1356  SpO2    Vitals shown include unvalidated device data.  Last Pain:  Vitals:   03/11/20 0654  TempSrc: Temporal  PainSc: 0-No pain      Patients Stated Pain Goal: 3 ([id number])  Complications: No apparent anesthesia complications

## 2020-03-11 NOTE — Op Note (Signed)
OPERATIVE REPORT  03/11/2020  Shannon Bryan  PREOPERATIVE DIAGNOSIS:  Bilateral macromastia.  POSTOPERATIVE DIAGNOSIS:  Bilateral macromastia.  PROCEDURE:  Bilateral reduction mammoplasties.  ATTENDING SURGEON:  Youlanda Roys, MD  ANESTHESIA:  General.  ANESTHESIOLOGIST:  , MD  COMPLICATIONS:  None.  INDICATIONS FOR THE PROCEDURE:  The patient is a 59 y.o. female who has bilateral macromastia that is clinically symptomatic.  She presents to undergo bilateral reduction mammoplasties.  DESCRIPTION OF PROCEDURE:  The patient was marked in preop holding area in a pattern of Wise for the future bilateral reduction mammoplasties. She was then taken back to the OR, placed on the table in supine position.  After adequate general anesthesia was obtained, the patient's chest was prepped with Techni-Care and draped in sterile fashion.  The bases of the breasts have been infiltrated with 1% lidocaine with epinephrine.  After adequate hemostasis and anesthesia taken effect, the procedure was begun.  Both of the breast reductions were performed in the following similar manner.  The nipple-areolar complex was marked with a 45-mm nipple marker.  The skin was then incised and deepithelialized around the nipple-areolar complex down to the inframammary crease in the inferior pedicle pattern.  Next, the medial, superior, and lateral skin flaps were elevated down to the chest wall.  Excess fat and glandular tissue removed from the inferior pedicle.  The nipple-areolar complex was examined and found to be pink and viable.  The wound was irrigated with saline irrigation.  Meticulous hemostasis was obtained with the Bovie electrocautery.  Inferior pedicle was centralized using 3-0 Prolene suture.  A #10 JP flat fully fluted drain was placed into the wound. The skin flaps were brought together at the inverted T junction with a 2- 0 Prolene suture.  The incisions were stapled for  temporary closure. The breasts compared and found to have good shape and symmetry.  The incisions were then closed from the medial aspect of the JP drain to the medial aspect of the [facility] incision by first placing a few 3-0 Monocryl sutures to tack together the dermal layer, and then both the dermal and cuticular layer were closed in a single layer using a 3-0 V-lock barbed suture.  Lateral to the JP drain incision was closed using 3-0 Monocryl in the dermal layer, followed by 3-0 Monocryl running intracuticular stitch on the skin.  The vertical limb of the Wise pattern was closed in the dermal layer using 3-0 Monocryl suture.  The patient was placed in the upright position.  The future location of the nipple-areolar complexes was marked on both breast mounds using the 45-mm nipple marker.  She was then placed back in the recumbent position.  Both of the nipple areolar complexes were brought out onto the breast mounds in the following similar manner.  The skin was incised as marked and removed in full thickness into the subcutaneous tissues.  The nipple- areolar complex was examined, found to be pink and viable, then brought out through this aperture and sewn in place using 4-0 Monocryl in the dermal layer, followed by 5-0 Monocryl running intracuticular stitch on the skin.  This 5-0 Monocryl suture was then brought down to close the cuticular layer of the vertical limb as well.  The JP drain was sewn in place using 3-0 nylon suture.  The pectoralis major muscle and fascia along with the breast and chest soft tissues were then infiltrated with 1% Exparel (total 266 mg).  Now the [facility] incision was also infiltrated with the  Exparel in order to give the patient postoperative pain control. There was a small area of skin on the right upper abdomen where a Bovie burn had occurred and this was sharply excised then closed with 3-0 Monocryl in the dermal layer and in the intracuticular stitch. This  was then dressed with steri-strips. The incisions were dressed with benzoin, Steri-Strips, and the nipples dressed with bacitracin ointment and Adaptic.  4x4s were placed over the incisions and ABD pads in the axillary areas.  The patient was placed into a light postoperative support bra.  There were no complications. The patient tolerated the procedure well.  The final needle, sponge counts were reported to be correct at the end of the case.  The patient was then recovered without complications.  Both the patient and her family were given proper postoperative wound care instructions. She was then discharged home in the care of her family in stable condition.  Follow up will be with me in a few days in the office.         Youlanda Roys, M.D.  03/11/2020 1:35 PM

## 2020-03-11 NOTE — Anesthesia Preprocedure Evaluation (Addendum)
Anesthesia Evaluation  Patient identified by MRN, date of birth, ID band Patient awake    Reviewed: Allergy & Precautions, NPO status , Patient's Chart, lab work & pertinent test results  History of Anesthesia Complications Negative for: history of anesthetic complications  Airway Mallampati: II  TM Distance: >3 FB Neck ROM: Full    Dental  (+) Teeth Intact   Pulmonary asthma , Patient abstained from smoking., former smoker,    Pulmonary exam normal        Cardiovascular hypertension, Normal cardiovascular exam     Neuro/Psych negative neurological ROS  negative psych ROS   GI/Hepatic negative GI ROS, Neg liver ROS,   Endo/Other  Morbid obesity  Renal/GU negative Renal ROS  negative genitourinary   Musculoskeletal negative musculoskeletal ROS (+)   Abdominal   Peds  Hematology negative hematology ROS (+)   Anesthesia Other Findings  Sees cardiology for atypical chest pain w/ dyspnea. Ischemic workup was all negative but she had uncontrolled HTN. At the time of her last visit with them BP control was better and she was no longer having CP. Dyspnea was thought likely secondary to deconditioning and obesity. She underwent Lexiscan Myoview in 04/2019 which showed a fixed anterior wall defect likely secondary to breast attenuation artifact. EF estimation was inaccurate. In this setting, she underwent echo in 04/2019, which showed an EF of 55-60% with no significant valvular abnormalities or pulmonary hypertension.  Reproductive/Obstetrics                          Anesthesia Physical Anesthesia Plan  ASA: III  Anesthesia Plan: General   Post-op Pain Management:    Induction: Intravenous  PONV Risk Score and Plan: 3 and Ondansetron, Dexamethasone, Treatment may vary due to age or medical condition and Midazolam  Airway Management Planned: Oral ETT  Additional Equipment: None  Intra-op Plan:    Post-operative Plan: Extubation in OR  Informed Consent: I have reviewed the patients History and Physical, chart, labs and discussed the procedure including the risks, benefits and alternatives for the proposed anesthesia with the patient or authorized representative who has indicated his/her understanding and acceptance.     Dental advisory given  Plan Discussed with:   Anesthesia Plan Comments:         Anesthesia Quick Evaluation

## 2020-03-11 NOTE — Brief Op Note (Signed)
03/11/2020  1:33 PM  PATIENT:  Shannon Bryan  59 y.o. female  PRE-OPERATIVE DIAGNOSIS:  BILATERAL MACROMASTIA  POST-OPERATIVE DIAGNOSIS:  BILATERAL MACROMASTIA  PROCEDURE:  Procedure(s): BREAST RECONSTRUCTION WITH MASTOPLASTY (Bilateral)  SURGEON:  Surgeon(s) and Role:    * Chade Pitner, Audrea Muscat, MD - Primary  ANESTHESIA:   general  EBL:  100 mL   BLOOD ADMINISTERED:none  DRAINS: (38F) Jackson-Pratt drain(s) with closed bulb suction in the Bilateeral Breasts   LOCAL MEDICATIONS USED:  1.3% Exparel (total 266 mgs.)  SPECIMEN:  Source of Specimen:  Bilateral breasts  DISPOSITION OF SPECIMEN:  PATHOLOGY  COUNTS:  YES  DICTATION: .Note written in EPIC  PLAN OF CARE: Discharge to home after PACU  PATIENT DISPOSITION:  PACU - hemodynamically stable.   Delay start of Pharmacological VTE agent (>24hrs) due to surgical blood loss or risk of bleeding: not applicable

## 2020-03-11 NOTE — Anesthesia Postprocedure Evaluation (Signed)
Anesthesia Post Note  Patient: Psychologist, forensic  Procedure(s) Performed: BREAST RECONSTRUCTION WITH MASTOPLASTY (Bilateral Breast)     Patient location during evaluation: PACU Anesthesia Type: General Level of consciousness: awake and alert Pain management: pain level controlled Vital Signs Assessment: post-procedure vital signs reviewed and stable Respiratory status: spontaneous breathing, nonlabored ventilation and respiratory function stable Cardiovascular status: blood pressure returned to baseline and stable Postop Assessment: no apparent nausea or vomiting Anesthetic complications: no    Last Vitals:  Vitals:   03/11/20 1500 03/11/20 1515  BP: (!) 147/86 133/78  Pulse: 88 92  Resp: 12 19  Temp:    SpO2: 100% 99%    Last Pain:  Vitals:   03/11/20 1451  TempSrc:   PainSc: Asleep                 Lidia Collum

## 2020-03-11 NOTE — Discharge Instructions (Signed)
1. No lifting greater than 5 lbs with arms for 4 weeks. 2. Empty, strip, record and reactivate JP drains 3 times a day. 3. Percocet 5/325 mg tabs 1-2 tabs po q 4-6 hours prn pain- prescription given in office. 4. Duricef 1 tab po bid- prescription given in office. 5. Sterapred dose pack as directed- prescription given in office. 6. Follow-up appointment Thursday in office.      Post Anesthesia Home Care Instructions  Activity: Get plenty of rest for the remainder of the day. A responsible individual must stay with you for 24 hours following the procedure.  For the next 24 hours, DO NOT: -Drive a car -Paediatric nurse -Drink alcoholic beverages -Take any medication unless instructed by your physician -Make any legal decisions or sign important papers.  Meals: Start with liquid foods such as gelatin or soup. Progress to regular foods as tolerated. Avoid greasy, spicy, heavy foods. If nausea and/or vomiting occur, drink only clear liquids until the nausea and/or vomiting subsides. Call your physician if vomiting continues.  Special Instructions/Symptoms: Your throat may feel dry or sore from the anesthesia or the breathing tube placed in your throat during surgery. If this causes discomfort, gargle with warm salt water. The discomfort should disappear within 24 hours.  If you had a scopolamine patch placed behind your ear for the management of post- operative nausea and/or vomiting:  1. The medication in the patch is effective for 72 hours, after which it should be removed.  Wrap patch in a tissue and discard in the trash. Wash hands thoroughly with soap and water. 2. You may remove the patch earlier than 72 hours if you experience unpleasant side effects which may include dry mouth, dizziness or visual disturbances. 3. Avoid touching the patch. Wash your hands with soap and water after contact with the patch.      Information for Discharge Teaching: EXPAREL (bupivacaine liposome  injectable suspension)   Your surgeon or anesthesiologist gave you EXPAREL(bupivacaine) to help control your pain after surgery.   EXPAREL is a local anesthetic that provides pain relief by numbing the tissue around the surgical site.  EXPAREL is designed to release pain medication over time and can control pain for up to 72 hours.  Depending on how you respond to EXPAREL, you may require less pain medication during your recovery.  Possible side effects:  Temporary loss of sensation or ability to move in the area where bupivacaine was injected.  Nausea, vomiting, constipation  Rarely, numbness and tingling in your mouth or lips, lightheadedness, or anxiety may occur.  Call your doctor right away if you think you may be experiencing any of these sensations, or if you have other questions regarding possible side effects.  Follow all other discharge instructions given to you by your surgeon or nurse. Eat a healthy diet and drink plenty of water or other fluids.  If you return to the hospital for any reason within 96 hours following the administration of EXPAREL, it is important for health care providers to know that you have received this anesthetic. A teal colored band has been placed on your arm with the date, time and amount of EXPAREL you have received in order to alert and inform your health care providers. Please leave this armband in place for the full 96 hours following administration, and then you may remove the band.       JP Drain [employer] this sheet to all of your post-operative appointments while you have your drains.  Please measure your drains by CC's or ML's.  Make sure you drain and measure your JP Drains 2 or 3 times per day.  At the end of each day, add up totals for the left side and add up totals for the right side.    ( 9 am )     ( 3 pm )        ( 9 pm )                Date L  R  L  R  L  R  Total L/R

## 2020-03-12 ENCOUNTER — Encounter: Payer: Self-pay | Admitting: *Deleted

## 2020-03-12 LAB — SURGICAL PATHOLOGY

## 2020-03-27 ENCOUNTER — Encounter: Payer: Self-pay | Admitting: Family

## 2020-03-27 ENCOUNTER — Other Ambulatory Visit: Payer: Self-pay

## 2020-03-27 ENCOUNTER — Ambulatory Visit (INDEPENDENT_AMBULATORY_CARE_PROVIDER_SITE_OTHER): Payer: PRIVATE HEALTH INSURANCE | Admitting: Family

## 2020-03-27 VITALS — BP 120/70 | HR 72 | Ht 69.0 in | Wt 302.4 lb

## 2020-03-27 DIAGNOSIS — R600 Localized edema: Secondary | ICD-10-CM

## 2020-03-27 DIAGNOSIS — Z720 Tobacco use: Secondary | ICD-10-CM | POA: Diagnosis not present

## 2020-03-27 DIAGNOSIS — I10 Essential (primary) hypertension: Secondary | ICD-10-CM | POA: Diagnosis not present

## 2020-03-27 NOTE — Progress Notes (Signed)
Office Visit    Patient Name: Shannon Bryan Date of Encounter: 03/28/2020  Primary Care Provider:  Mikey College, NP (Inactive) Primary Cardiologist:  Kathlyn Sacramento, MD Electrophysiologist:  None   Chief Complaint    Shannon Bryan is a 59 y.o. female with a hx of HTN, mild HLD, tobacco use, obesity, DOE, chest pain presents today for 4 month follow up of exertional dyspnea and chest pain.   Past Medical History    Past Medical History:  Diagnosis Date  . Asthma   . Blood clot in vein   . Chronic kidney disease   . Hyperlipidemia   . Hypertension   . Kidney stone    Past Surgical History:  Procedure Laterality Date  . ABDOMINAL HYSTERECTOMY    . ABLATION    . BREAST RECONSTRUCTION WITH MASTOPLASTY Bilateral 03/11/2020   Procedure: BREAST RECONSTRUCTION WITH MASTOPLASTY;  Surgeon: Contogiannis, Audrea Muscat, MD;  Location: [place];  Service: Plastics;  Laterality: Bilateral;  . CHOLECYSTECTOMY    . ROTATOR CUFF REPAIR Right   . TUBAL LIGATION      Allergies  Allergies  Allergen Reactions  . Bactrim [Sulfamethoxazole-Trimethoprim] Hives  . Penicillins Swelling  . Cleocin [Clindamycin Hcl] Rash    History of Present Illness    Shannon Bryan is a 59 y.o. female with a hx of HTN, mild HLD, tobacco use, obesity, DOE, chest pain last seen 11/2019 by Christell Faith, PA.   Seen 12/2018 with worsening exertional dyspnea and chest pain in setting of uncontrolled HTN with BP as high as [id number] systolic. She was on HCTZ 25mg  daily and Carvedilol 6.25 mg BID was added by Dr. Fletcher Anon. Underwent Lexiscan with fixed anterior wall defect likely due to breast attenuation. EF estimation not accurate. Echocardiogram 04/20/19 with LVEF 55-60%, no significant valvular abnormalities, no pulmonary HTN. 05/2019 noted to be under increased stress with brother passing away from COVID-19. Spironolactone was added and Amlodipine resumed due to worsening pedal edema and  uncontrolled HTN.  When seen 11/2019 she reported feeling well and weight was down 10lbs.  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 Presently off work due to recent surgery. Underwent breast reduction 03/11/20.    Reports doing well from cardiac perspective. Still with significant soreness from her breat reduction but hopeful this will improve her back pain. Reports LE edema is well controlled on Spironolactone. Reports no shortness of breath nor dyspnea on exertion. Reports no chest pain, pressure, or tightness. No edema, orthopnea, PND. Reports no palpitations.     EKGs/Labs/Other Studies Reviewed:   The following studies were reviewed today:  Echocardiogram 04/20/2019  1. The left ventricle has normal systolic function, with an ejection fraction of 55-60%. The cavity size was normal. Left ventricular diastolic parameters were normal. No evidence of left ventricular regional wall motion abnormalities.  2. The right ventricle has normal systolic function. The cavity was normal. There is no increase in right ventricular wall thickness. Right ventricular systolic pressure is normal.  3. The mitral valve is grossly normal. There is mild mitral annular calcification present.  4. The aortic valve was not well visualized.  5. The aortic root and ascending aorta are normal in size and structure.  6. The interatrial septum was not well visualized.   EKG:  No EKG today as she recently had breast reduction surgery to prevent pain.   Recent Labs: 03/07/2020: BUN 9; Creatinine, Ser 0.86; Potassium 4.0; Sodium 138  Recent Lipid Panel    Component  Value Date/Time   CHOL 176 03/29/2017 0833   TRIG 99 03/29/2017 0833   HDL 42 (L) 03/29/2017 0833   CHOLHDL 4.2 03/29/2017 0833   VLDL 20 03/29/2017 0833   LDLCALC 114 (H) 03/29/2017 0833    Home Medications   Current Meds  Medication Sig  . acetaminophen (TYLENOL) 500 MG tablet Take 500 mg by mouth every 6 (six) hours as needed.  Marland Kitchen amLODipine (NORVASC) 5 MG  tablet Take 1 tablet (5 mg total) by mouth daily.  Marland Kitchen aspirin EC 81 MG tablet Take 81 mg by mouth daily.  . carvedilol (COREG) 6.25 MG tablet Take 1 tablet (6.25 mg total) by mouth 2 (two) times daily.  . cetirizine (ZYRTEC) 10 MG tablet Take 10 mg by mouth daily.  . hydrochlorothiazide (HYDRODIURIL) 25 MG tablet Take 1 tablet by mouth once daily  . ibuprofen (ADVIL) 800 MG tablet Take 1 tablet (800 mg total) by mouth 3 (three) times daily as needed.  Marland Kitchen ipratropium (ATROVENT) 0.06 % nasal spray Place 2 sprays into both nostrils 4 (four) times daily.  . Multiple Vitamins-Minerals (ONE-A-DAY 50 PLUS PO) Take 1 tablet by mouth daily.  Marland Kitchen oxyCODONE-acetaminophen (PERCOCET/ROXICET) 5-325 MG tablet Take 1-2 tablets by mouth every 4 (four) hours as needed.  Marland Kitchen spironolactone (ALDACTONE) 25 MG tablet Take 1 tablet (25 mg total) by mouth daily.  . traZODone (DESYREL) 50 MG tablet Take 0.5-1 tablets (25-50 mg total) by mouth at bedtime as needed for sleep.     Review of Systems       Review of Systems  Constitution: Negative for chills, fever and malaise/fatigue.  Cardiovascular: Positive for chest pain ("below R breast") and leg swelling. Negative for dyspnea on exertion, near-syncope, orthopnea, palpitations and syncope.  Respiratory: Negative for cough, shortness of breath and wheezing.   Musculoskeletal: Positive for back pain.  Gastrointestinal: Negative for nausea and vomiting.  Neurological: Negative for dizziness, light-headedness and weakness.   All other systems reviewed and are otherwise negative except as noted above.  Physical Exam    VS:  BP 120/70 (BP Location: Left Arm, Patient Position: Sitting, Cuff Size: Normal)   Pulse 72   Ht 5\' 9"  (1.753 m)   Wt (!) 302 lb 6 oz (137.2 kg)   SpO2 99%   BMI 44.65 kg/m  , BMI Body mass index is 44.65 kg/m. GEN: Well nourished, overweight, well developed, in no acute distress. HEENT: normal. Neck: Supple, no JVD, carotid bruits, or  masses. Cardiac: RRR, no murmurs, rubs, or gallops. No clubbing, cyanosis.  Lateral lower extremities with nonpitting edema to ankle.  Radials/PT 2+ and equal bilaterally.  Respiratory:  Respirations regular and unlabored, clear to auscultation bilaterally. GI: Soft, nontender, nondistended, BS + x 4. MS: No deformity or atrophy. Skin: Warm and dry, no rash. Neuro:  Strength and sensation are intact. Psych: Normal affect.   Assessment & Plan    1. Exertional dyspnea and chest pain - Resolved.  Stress test 04/16/2019 with fixed anterior wall defect likely due to breast attenuation.  Echo 04/2019 normal LVEF, normal RV size/function. Improved since optimal control of BP. No indication for additional ischemic evaluation at this time.   2. Bilateral LE edema - Resolved after addition of Spironolactons. Echo 04/2019 LVEF 55 to 60%, normal LV diastolic function, normal RV size and function. Recommend compression stockings, low sodium diet, elevating lower extremities.   3. HTN - BP well controlled. Continue present antihypertensive regimen including Amlodipine 5mg  daily, Coreg 6.25mg  BID, HCTZ  25mg  daily, Spironolactone 25mg  daily..   4. Mild HLD - LDL goal <100 as recent [place] without evidence of CAD. Recommend lipid lowering diet. She will continue to follow with her PCP.  Tobacco abuse - Smoking cessation encouraged. Recommend utilization of [id number].  Disposition: in 3 months with Dr. Fletcher Anon or APP.   Loel Dubonnet, NP 03/28/2020, 5:27 PM

## 2020-03-27 NOTE — Patient Instructions (Addendum)
Medication Instructions:  No medication changes today.   *If you need a refill on your cardiac medications before your next appointment, please call your pharmacy*  Lab Work: No lab work today. Your recent blood work showed your electrolytes and kidney function were normal.   Testing/Procedures: None ordered today.   Follow-Up: At [facility], you and your health needs are our priority.  As part of our continuing mission to provide you with exceptional heart care, we have created designated Provider Care Teams.  These Care Teams include your primary Cardiologist (physician) and Advanced Practice Providers (APPs -  Physician Assistants and Nurse Practitioners) who all work together to provide you with the care you need, when you need it.  We recommend signing up for the patient portal called "MyChart".  Sign up information is provided on this After Visit Summary.  MyChart is used to connect with patients for Virtual Visits (Telemedicine).  Patients are able to view lab/test results, encounter notes, upcoming appointments, etc.  Non-urgent messages can be sent to your provider as well.   To learn more about what you can do with MyChart, go to [email or web address].    Your next appointment:   3-4 month(s)  The format for your next appointment:   In Person  Provider:   You may see Shannon Sacramento, MD or one of the following Advanced Practice Providers on your designated Care Team:    Shannon Hodgkins, NP  Shannon Faith, PA-C  Shannon Mood, PA-C  Shannon Montana, NP  Other Instructions  Continue low salt diet.  Recommend elevating your legs when sitting.   Venous Insufficiency Chronic venous insufficiency is a condition where the leg veins cannot effectively pump blood from the legs to the heart. This happens when the vein walls are either stretched, weakened, or damaged, or when the valves inside the vein are damaged. With the right treatment, you should be able to continue  with an active life. This condition is also called venous stasis. What are the causes? Common causes of this condition include:  High blood pressure inside the veins (venous hypertension).  Sitting or standing too long, causing increased blood pressure in the leg veins.  A blood clot that blocks blood flow in a vein (deep vein thrombosis, DVT).  Inflammation of a vein (phlebitis) that causes a blood clot to form.  Tumors in the pelvis that cause blood to back up. What increases the risk? The following factors may make you more likely to develop this condition:  Having a family history of this condition.  Obesity.  Pregnancy.  Living without enough regular physical activity or exercise (sedentary lifestyle).  Smoking.  Having a job that requires long periods of standing or sitting in one place.  Being a certain age. Women in their 27s and 74s and men in their 88s are more likely to develop this condition. What are the signs or symptoms? Symptoms of this condition include:  Veins that are enlarged, bulging, or twisted (varicose veins).  Skin breakdown or ulcers.  Reddened skin or dark discoloration of skin on the leg between the knee and ankle.  Brown, smooth, tight, and painful skin just above the ankle, usually on the inside of the leg (lipodermatosclerosis).  Swelling of the legs. How is this diagnosed? This condition may be diagnosed based on:  Your medical history.  A physical exam. How is this treated? The goals of treatment are to help you return to an active life and to minimize pain or disability. Treatment  depends on the severity of your condition, and it may include:  Wearing compression stockings. These can help relieve symptoms and help prevent your condition from getting worse. However, they do not cure the condition.  Sclerotherapy. This procedure involves an injection of a solution that shrinks damaged veins. ?  ? Repairing or reconstructing a valve  within the affected vein. Follow these instructions at home:  Wear compression stockings as told by your health care provider. These stockings help to prevent blood clots and reduce swelling in your legs.  Take over-the-counter and prescription medicines only as told by your health care provider.  Stay active by exercising, walking, or doing different activities. Ask your health care provider what activities are safe for you and how much exercise you need.  Drink enough fluid to keep your urine pale yellow.  Do not use any products that contain nicotine or tobacco, such as cigarettes, e-cigarettes, and chewing tobacco. If you need help quitting, ask your health care provider.  Keep all follow-up visits as told by your health care provider. This is important. Contact a health care provider if you:  Have redness, swelling, or more pain in the affected area.  See a red streak or line that goes up or down from the affected area.  Have skin breakdown or skin loss in the affected area, even if the breakdown is small.  Get an injury in the affected area. Get help right away if:  You get an injury and an open wound in the affected area.  You have: ? Severe pain that does not get better with medicine. ? Sudden numbness or weakness in the foot or ankle below the affected area. ? Trouble moving your foot or ankle. ? A fever. ? Worse or persistent symptoms. ? Chest pain. ? Shortness of breath. Summary  Chronic venous insufficiency is a condition where the leg veins cannot effectively pump blood from the legs to the heart.  Chronic venous insufficiency occurs when the vein walls become stretched, weakened, or damaged, or when valves within the vein are damaged.  Treatment depends on how severe your condition is. It often involves wearing compression stockings and may involve having a procedure.  Make sure you stay active by exercising, walking, or doing different activities. Ask your  health care provider what activities are safe for you and how much exercise you need. This information is not intended to replace advice given to you by your health care provider. Make sure you discuss any questions you have with your health care provider. Document Revised: 07/11/2018 Document Reviewed: 07/11/2018 Elsevier Patient Education  [place].

## 2020-03-30 ENCOUNTER — Encounter: Payer: Self-pay | Admitting: Family

## 2020-06-20 ENCOUNTER — Emergency Department: Payer: Managed Care, Other (non HMO)

## 2020-06-20 ENCOUNTER — Other Ambulatory Visit: Payer: Self-pay

## 2020-06-20 ENCOUNTER — Encounter: Payer: Self-pay | Admitting: Emergency Medicine

## 2020-06-20 ENCOUNTER — Emergency Department
Admission: EM | Admit: 2020-06-20 | Discharge: 2020-06-20 | Disposition: A | Discharge status: Home / Self Care | Payer: Managed Care, Other (non HMO) | Attending: Emergency Medicine | Admitting: Emergency Medicine

## 2020-06-20 DIAGNOSIS — Z87442 Personal history of urinary calculi: Secondary | ICD-10-CM | POA: Diagnosis not present

## 2020-06-20 DIAGNOSIS — Z87891 Personal history of nicotine dependence: Secondary | ICD-10-CM | POA: Insufficient documentation

## 2020-06-20 DIAGNOSIS — N3000 Acute cystitis without hematuria: Secondary | ICD-10-CM | POA: Diagnosis not present

## 2020-06-20 DIAGNOSIS — I10 Essential (primary) hypertension: Secondary | ICD-10-CM | POA: Diagnosis not present

## 2020-06-20 DIAGNOSIS — Z79899 Other long term (current) drug therapy: Secondary | ICD-10-CM | POA: Insufficient documentation

## 2020-06-20 DIAGNOSIS — M545 Low back pain, unspecified: Secondary | ICD-10-CM

## 2020-06-20 LAB — CBC WITH DIFFERENTIAL/PLATELET
Abs Immature Granulocytes: 0.02 10*3/uL (ref 0.00–0.07)
Basophils Absolute: 0.1 10*3/uL (ref 0.0–0.1)
Basophils Relative: 1 %
Eosinophils Absolute: 0.2 10*3/uL (ref 0.0–0.5)
Eosinophils Relative: 2 %
HCT: 44.2 % (ref 36.0–46.0)
Hemoglobin: 14 g/dL (ref 12.0–15.0)
Immature Granulocytes: 0 %
Lymphocytes Relative: 45 %
Lymphs Abs: 3.9 10*3/uL (ref 0.7–4.0)
MCH: 26.8 pg (ref 26.0–34.0)
MCHC: 31.7 g/dL (ref 30.0–36.0)
MCV: 84.5 fL (ref 80.0–100.0)
Monocytes Absolute: 0.4 10*3/uL (ref 0.1–1.0)
Monocytes Relative: 5 %
Neutro Abs: 4.2 10*3/uL (ref 1.7–7.7)
Neutrophils Relative %: 47 %
Platelets: 268 10*3/uL (ref 150–400)
RBC: 5.23 MIL/uL — ABNORMAL HIGH (ref 3.87–5.11)
RDW: 15.9 % — ABNORMAL HIGH (ref 11.5–15.5)
WBC: 8.7 10*3/uL (ref 4.0–10.5)
nRBC: 0 % (ref 0.0–0.2)

## 2020-06-20 LAB — URINALYSIS, COMPLETE (UACMP) WITH MICROSCOPIC
Bilirubin Urine: NEGATIVE
Glucose, UA: NEGATIVE mg/dL
Hgb urine dipstick: NEGATIVE
Ketones, ur: NEGATIVE mg/dL
Nitrite: NEGATIVE
Protein, ur: NEGATIVE mg/dL
Specific Gravity, Urine: 1.005 (ref 1.005–1.030)
pH: 7 (ref 5.0–8.0)

## 2020-06-20 LAB — COMPREHENSIVE METABOLIC PANEL
ALT: 20 U/L (ref 0–44)
AST: 20 U/L (ref 15–41)
Albumin: 3.9 g/dL (ref 3.5–5.0)
Alkaline Phosphatase: 69 U/L (ref 38–126)
Anion gap: 10 (ref 5–15)
BUN: 7 mg/dL (ref 6–20)
CO2: 24 mmol/L (ref 22–32)
Calcium: 8.8 mg/dL — ABNORMAL LOW (ref 8.9–10.3)
Chloride: 106 mmol/L (ref 98–111)
Creatinine, Ser: 0.75 mg/dL (ref 0.44–1.00)
GFR calc Af Amer: >60 mL/min (ref >60)
GFR calc non Af Amer: >60 mL/min (ref >60)
Glucose, Bld: 95 mg/dL (ref 70–99)
Potassium: 3.7 mmol/L (ref 3.5–5.1)
Sodium: 140 mmol/L (ref 135–145)
Total Bilirubin: 0.7 mg/dL (ref 0.3–1.2)
Total Protein: 7.4 g/dL (ref 6.5–8.1)

## 2020-06-20 IMAGING — CT CT RENAL STONE PROTOCOL
2 of 4 series · 16 of 46 positions shown, 18 images · non-contrast
Comparison: 04/10/2018

CLINICAL DATA: Flank pain, worse with movement

EXAM:
CT ABDOMEN AND PELVIS WITHOUT CONTRAST
TECHNIQUE: Multidetector CT imaging of the abdomen and pelvis was performed
following the standard protocol without IV contrast.

[Series 2: stone full standard · axial · 0.89mm/px · z∈[-730,-270]mm · 13 of 101 slices shown, 15 images]
[im 5/101  soft-tissue]
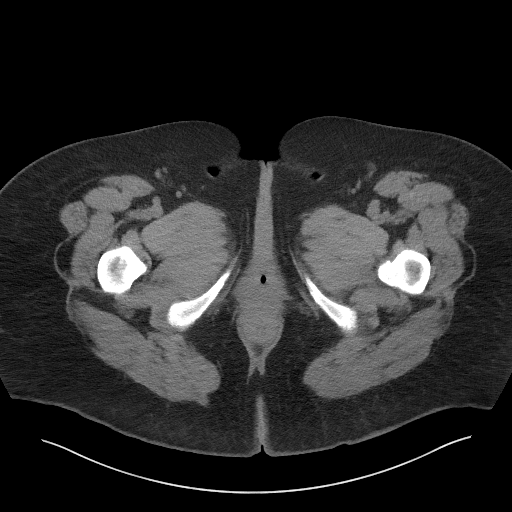
[im 5/101  bone]
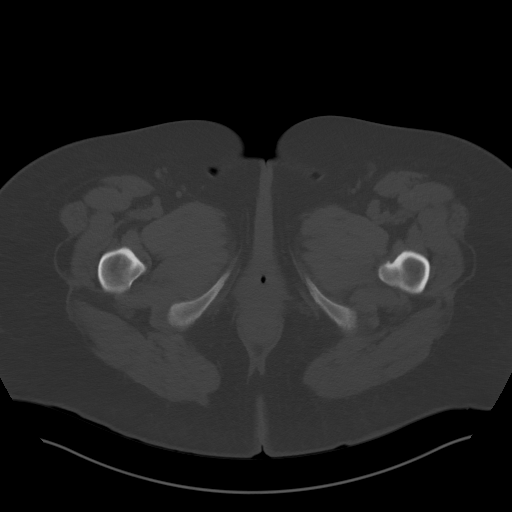
[im 13/101  soft-tissue]
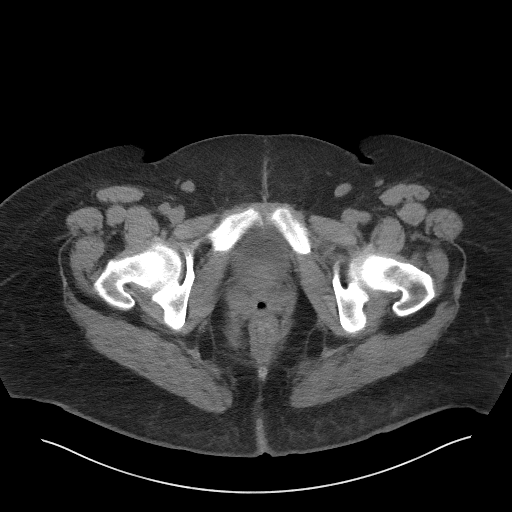
[im 21/101  soft-tissue]
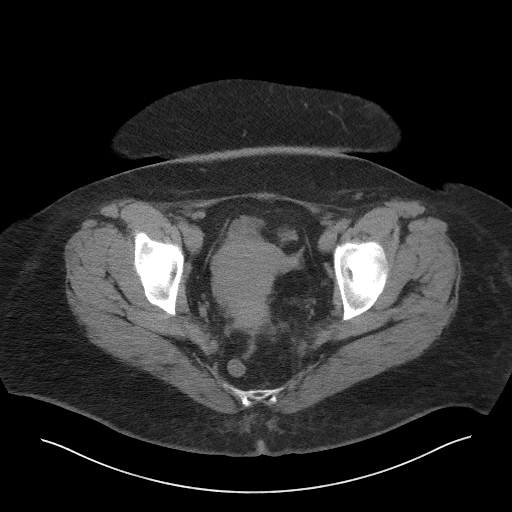
[im 29/101  soft-tissue]
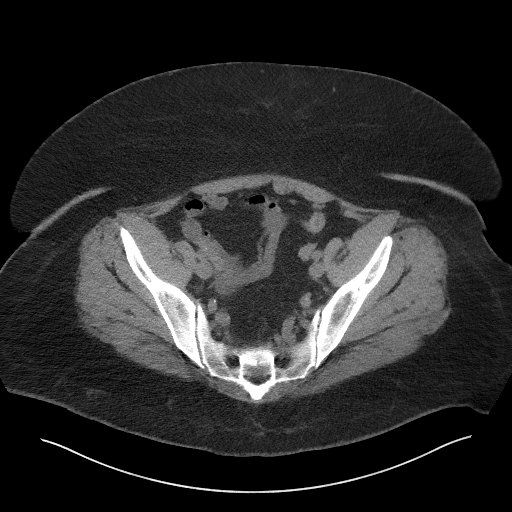
[im 37/101  soft-tissue]
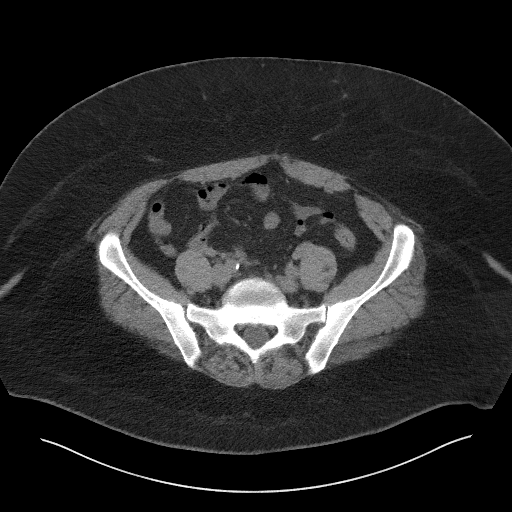
[im 45/101  soft-tissue]
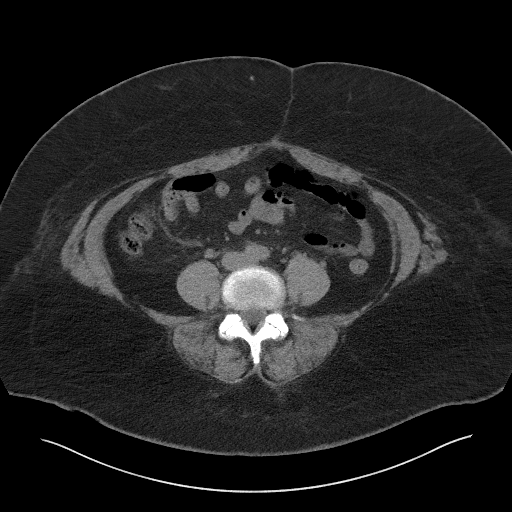
[im 53/101  soft-tissue]
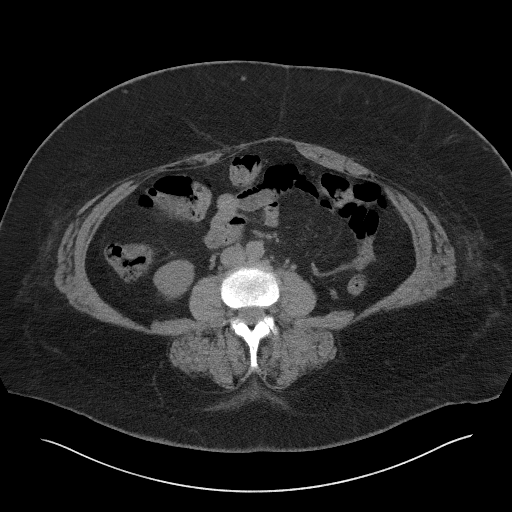
[im 57/101  soft-tissue]
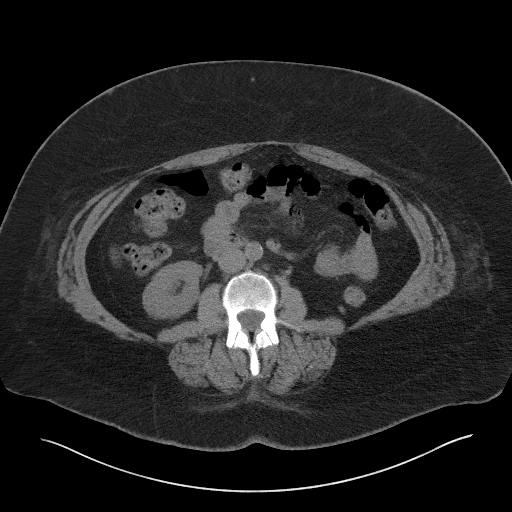
[im 65/101  soft-tissue]
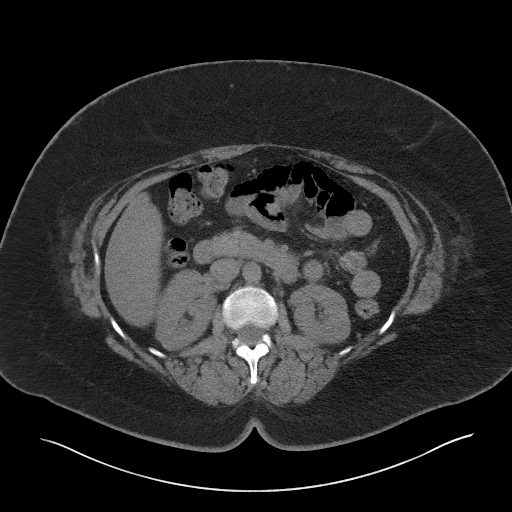
[im 65/101  bone]
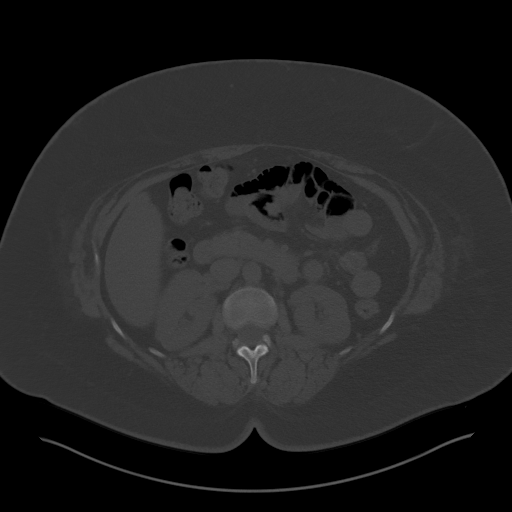
[im 73/101  soft-tissue]
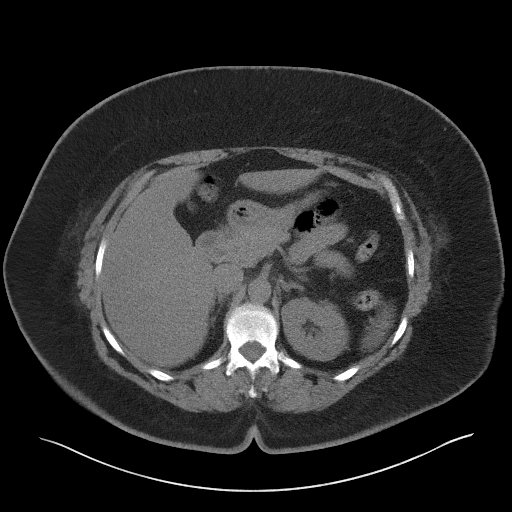
[im 81/101  soft-tissue]
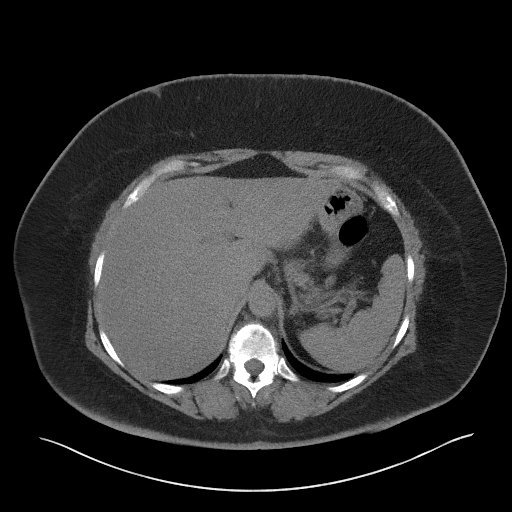
[im 89/101  soft-tissue]
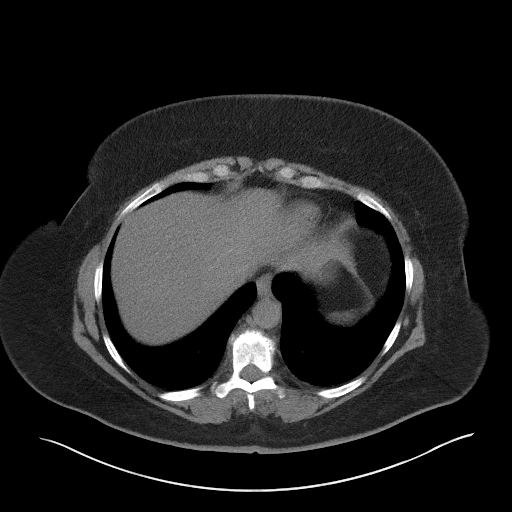
[im 97/101  soft-tissue]
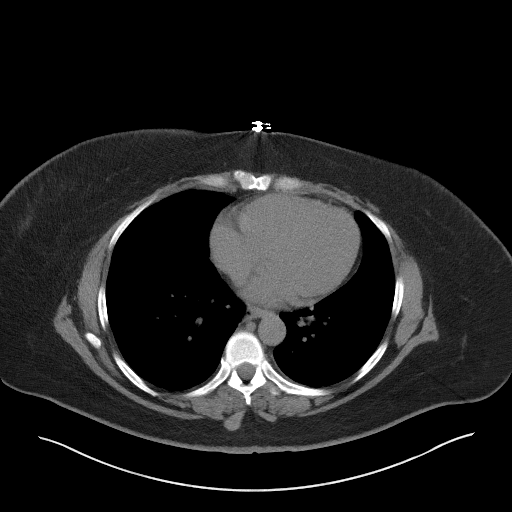

[Series 5: coronal · coronal · 0.83mm/px · 3 of 151 slices shown]
[im 51/151  soft-tissue]
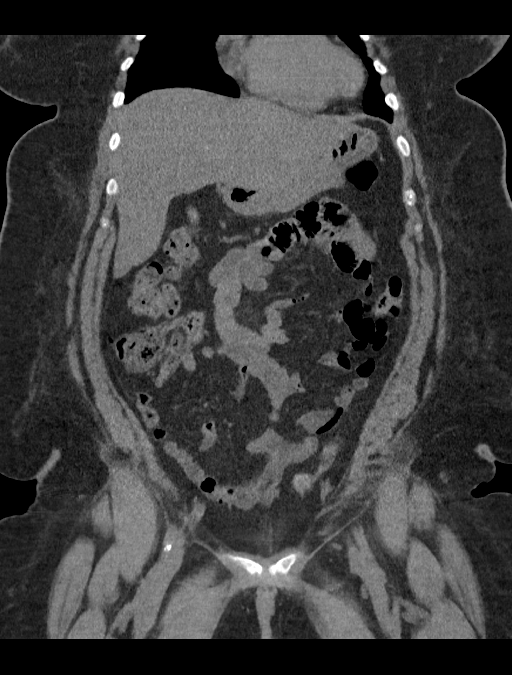
[im 67/151  soft-tissue]
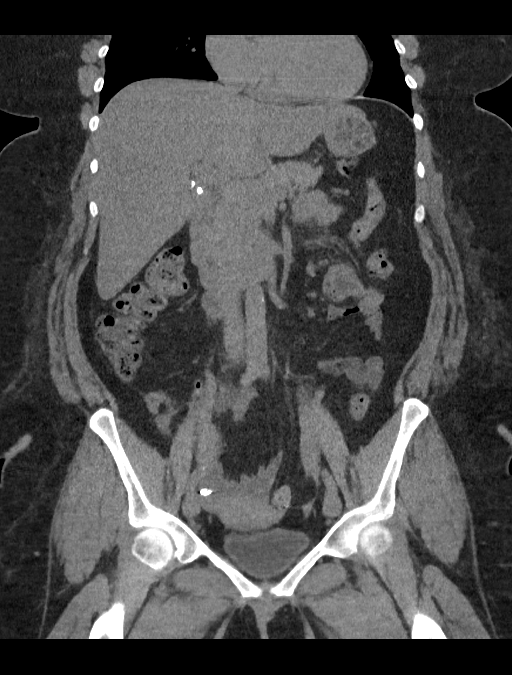
[im 84/151  soft-tissue]
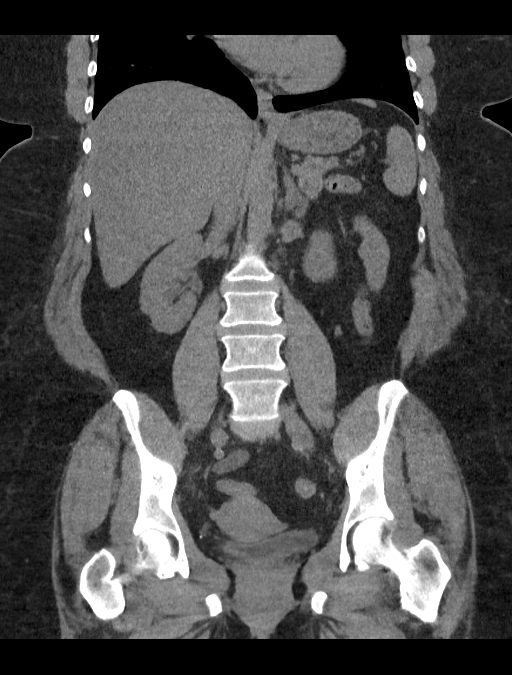

[16 of 46 positions shown; findings below may reference images not displayed]

FINDINGS: Lower chest: No acute pleural or parenchymal lung disease.

Hepatobiliary: No focal liver abnormality is seen. Status post
cholecystectomy. No biliary dilatation.

Pancreas: Unremarkable. No pancreatic ductal dilatation or
surrounding inflammatory changes.

Spleen: Normal in size without focal abnormality.

Adrenals/Urinary Tract: Adrenal glands are unremarkable. Kidneys are
normal, without renal calculi, focal lesion, or hydronephrosis.
Bladder is unremarkable.

Stomach/Bowel: No bowel obstruction or ileus. Normal appendix right
lower quadrant. No wall thickening or inflammatory change.

Vascular/Lymphatic: Aortic atherosclerosis. No enlarged abdominal or
pelvic lymph nodes.

Reproductive: Uterus and bilateral adnexa are unremarkable.

Other: No free fluid or free gas. No abdominal wall hernia.

Musculoskeletal: No acute or destructive bony lesions. Reconstructed
images demonstrate no additional findings.
IMPRESSION: No acute intra-abdominal or intrapelvic process.

## 2020-06-20 MED ORDER — KETOROLAC TROMETHAMINE 30 MG/ML IJ SOLN
30.0000 mg | Freq: Once | INTRAMUSCULAR | Status: AC
  Administered 2020-06-20: 30 mg via INTRAVENOUS
  Filled 2020-06-20: qty 1

## 2020-06-20 MED ORDER — CEPHALEXIN 500 MG PO CAPS
500.0000 mg | ORAL_CAPSULE | Freq: Once | ORAL | Status: AC
  Administered 2020-06-20: 500 mg via ORAL
  Filled 2020-06-20: qty 1

## 2020-06-20 MED ORDER — CEPHALEXIN 500 MG PO CAPS: 500.0000 mg | ORAL_CAPSULE | Freq: Two times a day (BID) | ORAL | 0 refills | Status: AC

## 2020-06-20 MED ORDER — PREDNISONE 10 MG PO TABS
ORAL_TABLET | ORAL | 0 refills | Status: AC
Start: 2020-06-20 — End: 2020-06-26

## 2020-06-20 MED ORDER — CYCLOBENZAPRINE HCL 5 MG PO TABS: 5.0000 mg | ORAL_TABLET | Freq: Three times a day (TID) | ORAL | 0 refills | Status: AC | PRN

## 2020-06-20 NOTE — ED Triage Notes (Signed)
Pt presents to ED via POV with L lower back pain that radiates across her lower back with movement.

## 2020-06-20 NOTE — ED Triage Notes (Cosign Needed)
Emergency Medicine Provider Triage Evaluation Note  Shannon Bryan , a 59 y.o. female  was evaluated in triage.  Pt complains of nontraumatic back pain. Patient reports pain started upon awakening this morning. Pain worsens with movement and ambulation. Pain worsens with bending over. She denies dysuria. She does state that it feels similar to a kidney stone. No alleviating measures attempted prior to arrival.  Review of Systems  Positive: Back pain Negative: Dysuria, fever  Physical Exam  BP (!) 178/85 (BP Location: Left Arm)   Pulse 78   Temp 99 F (37.2 C) (Oral)   Resp 20   Ht 5\' 9"  (1.753 m)   Wt 136.1 kg   SpO2 100%   BMI 44.30 kg/m  Gen:   Awake, no distress   HEENT:  Atraumatic  Resp:  Normal effort  Cardiac:  Normal rate  Abd:   Nondistended, nontender  MSK:   Moves extremities without difficulty  Neuro:  Speech clear   Medical Decision Making  Medically screening exam initiated at 3:55 PM.  Appropriate orders placed.  Shannon Bryan was informed that the remainder of the evaluation will be completed by another provider, this initial triage assessment does not replace that evaluation, and the importance of remaining in the ED until their evaluation is complete.  Clinical Impression   Acute lumbar back pain   Shannon Dike, FNP 06/20/20 1601

## 2020-06-20 NOTE — ED Provider Notes (Signed)
[facility] Emergency Department Provider Note   ____________________________________________   First MD Initiated Contact with Patient 06/20/20 1855     (approximate)  I have reviewed the triage vital signs and the nursing notes.   HISTORY  Chief Complaint Back Pain   HPI Shannon Bryan is a 59 y.o. female who presents to the emergency department for evaluation of back pain.  She states the pain began this morning when getting out of bed.  She has not had any known injury to the area.  She states the pain is worse with any movement especially bending over.  She denies dysuria, hematuria.  She does describe that it seems similar to a kidney stone but she is unsure.  She has not tried any treatments for her pain.         Past Medical History:  Diagnosis Date  . Asthma   . Blood clot in vein   . Chronic kidney disease   . Hyperlipidemia   . Hypertension   . Kidney stone     Patient Active Problem List   Diagnosis Date Noted  . Prediabetes 01/06/2019  . Essential hypertension 12/22/2017    Past Surgical History:  Procedure Laterality Date  . ABDOMINAL HYSTERECTOMY    . ABLATION    . BREAST RECONSTRUCTION WITH MASTOPLASTY Bilateral 03/11/2020   Procedure: BREAST RECONSTRUCTION WITH MASTOPLASTY;  Surgeon: Contogiannis, Audrea Muscat, MD;  Location: [place];  Service: Plastics;  Laterality: Bilateral;  . CHOLECYSTECTOMY    . ROTATOR CUFF REPAIR Right   . TUBAL LIGATION      Prior to Admission medications   Medication Sig Start Date End Date Taking? Authorizing Provider  acetaminophen (TYLENOL) 500 MG tablet Take 500 mg by mouth every 6 (six) hours as needed.    [provider]  amLODipine (NORVASC) 5 MG tablet Take 1 tablet (5 mg total) by mouth daily. 10/16/19 03/27/20  Loel Dubonnet, NP  aspirin EC 81 MG tablet Take 81 mg by mouth daily.    [provider]  carvedilol (COREG) 6.25 MG tablet Take 1 tablet  (6.25 mg total) by mouth 2 (two) times daily. 01/19/19   Wellington Hampshire, MD  cephALEXin (KEFLEX) 500 MG capsule Take 1 capsule (500 mg total) by mouth 2 (two) times daily for 10 days. 06/20/20 06/30/20  Marlana Salvage, PA  cetirizine (ZYRTEC) 10 MG tablet Take 10 mg by mouth daily.    [provider]  cyclobenzaprine (FLEXERIL) 5 MG tablet Take 1 tablet (5 mg total) by mouth 3 (three) times daily as needed for up to 5 days for muscle spasms. 06/20/20 06/25/20  Marlana Salvage, PA  hydrochlorothiazide (HYDRODIURIL) 25 MG tablet Take 1 tablet by mouth once daily 01/18/20   Parks Ranger, Devonne Doughty, DO  ibuprofen (ADVIL) 800 MG tablet Take 1 tablet (800 mg total) by mouth 3 (three) times daily as needed. 11/20/19   Dunn, Areta Haber, PA-C  ipratropium (ATROVENT) 0.06 % nasal spray Place 2 sprays into both nostrils 4 (four) times daily. 12/22/17   Mikey College, NP  Multiple Vitamins-Minerals (ONE-A-DAY 50 PLUS PO) Take 1 tablet by mouth daily.    [provider]  oxyCODONE-acetaminophen (PERCOCET/ROXICET) 5-325 MG tablet Take 1-2 tablets by mouth every 4 (four) hours as needed. 03/21/20   [provider]  predniSONE (DELTASONE) 10 MG tablet Take 6 tablets (60 mg total) by mouth daily for 1 day, THEN 5 tablets (50 mg total) daily for  1 day, THEN 4 tablets (40 mg total) daily for 1 day, THEN 3 tablets (30 mg total) daily for 1 day, THEN 2 tablets (20 mg total) daily for 1 day, THEN 1 tablet (10 mg total) daily for 1 day. 06/20/20 06/26/20  Marlana Salvage, PA  spironolactone (ALDACTONE) 25 MG tablet Take 1 tablet (25 mg total) by mouth daily. 02/05/20 05/05/20  Wellington Hampshire, MD  traZODone (DESYREL) 50 MG tablet Take 0.5-1 tablets (25-50 mg total) by mouth at bedtime as needed for sleep. 01/04/19   Mikey College, NP    Allergies Bactrim [sulfamethoxazole-trimethoprim], Penicillins, and Cleocin [clindamycin hcl]  Family History  Problem Relation Age of Onset  .  Heart disease Mother   . Stroke Mother   . Diabetes Mother   . Cancer Mother        bone  . Depression Mother   . Emphysema Father     Social History Social History   Tobacco Use  . Smoking status: Former Smoker    Packs/day: 0.25    Years: 30.00    Pack years: 7.50    Quit date: 02/03/2020    Years since quitting: 0.3  . Smokeless tobacco: Former Systems developer  . Tobacco comment: 1/4 - 1/2 ppd had quit for 1 year in 2010  Vaping Use  . Vaping Use: Some days  . Devices: Vaping  Substance Use Topics  . Alcohol use: Yes    Comment: occasional  . Drug use: No    Review of Systems  Constitutional: No fever/chills Eyes: No visual changes. ENT: No sore throat. Cardiovascular: Denies chest pain. Respiratory: Denies shortness of breath. Gastrointestinal: No abdominal pain.  No nausea, no vomiting.  No diarrhea.  No constipation. Genitourinary: Negative for dysuria, hematuria. Musculoskeletal: Positive for back pain. Skin: Negative for rash. Neurological: Negative for headaches, focal weakness or numbness.   ____________________________________________   PHYSICAL EXAM:  VITAL SIGNS: ED Triage Vitals  Enc Vitals Group     BP 06/20/20 1544 (!) 178/85     Pulse Rate 06/20/20 1544 78     Resp 06/20/20 1544 20     Temp 06/20/20 1544 99 F (37.2 C)     Temp Source 06/20/20 1544 Oral     SpO2 06/20/20 1544 100 %     Weight 06/20/20 1542 300 lb (136.1 kg)     Height 06/20/20 1542 5\' 9"  (1.753 m)     Head Circumference --      Peak Flow --      Pain Score 06/20/20 1542 3     Pain Loc --      Pain Edu? --      Excl. in [place]? --     Constitutional: Alert and oriented. Well appearing and in no acute distress. Eyes: Conjunctivae are normal.  Mouth/Throat: Mucous membranes are moist.  Oropharynx non-erythematous. Neck: No stridor.   Cardiovascular: Normal rate, regular rhythm. Grossly normal heart sounds.  Good peripheral circulation. Respiratory: Normal respiratory effort.  No  retractions. Lungs CTAB. Gastrointestinal: Soft and nontender. No distention. No abdominal bruits. + Right CVA tenderness, no left CVA tenderness. Musculoskeletal: The right side of the lumbar spine is tender to palpation, there is no midline tenderness.  No lower extremity tenderness nor edema.  No joint effusions. Neurologic:  Normal speech and language. No gross focal neurologic deficits are appreciated. No gait instability. Skin:  Skin is warm, dry and intact. No rash noted. Psychiatric: Mood and affect are normal. Speech and behavior  are normal.  ____________________________________________   LABS (all labs ordered are listed, but only abnormal results are displayed)  Labs Reviewed  URINALYSIS, COMPLETE (UACMP) WITH MICROSCOPIC - Abnormal; Notable for the following components:      Result Value   Color, Urine YELLOW (*)    APPearance HAZY (*)    Leukocytes,Ua TRACE (*)    Bacteria, UA MANY (*)    All other components within normal limits  CBC WITH DIFFERENTIAL/PLATELET - Abnormal; Notable for the following components:   RBC 5.23 (*)    RDW 15.9 (*)    All other components within normal limits  COMPREHENSIVE METABOLIC PANEL - Abnormal; Notable for the following components:   Calcium 8.8 (*)    All other components within normal limits  URINE CULTURE    ____________________________________________  RADIOLOGY   Official radiology report(s): CT Renal Stone Study  Result Date: 06/20/2020 CLINICAL DATA:  Flank pain, worse with movement EXAM: CT ABDOMEN AND PELVIS WITHOUT CONTRAST TECHNIQUE: Multidetector CT imaging of the abdomen and pelvis was performed following the standard protocol without IV contrast. COMPARISON:  04/10/2018 FINDINGS: Lower chest: No acute pleural or parenchymal lung disease. Hepatobiliary: No focal liver abnormality is seen. Status post cholecystectomy. No biliary dilatation. Pancreas: Unremarkable. No pancreatic ductal dilatation or surrounding  inflammatory changes. Spleen: Normal in size without focal abnormality. Adrenals/Urinary Tract: Adrenal glands are unremarkable. Kidneys are normal, without renal calculi, focal lesion, or hydronephrosis. Bladder is unremarkable. Stomach/Bowel: No bowel obstruction or ileus. Normal appendix right lower quadrant. No wall thickening or inflammatory change. Vascular/Lymphatic: Aortic atherosclerosis. No enlarged abdominal or pelvic lymph nodes. Reproductive: Uterus and bilateral adnexa are unremarkable. Other: No free fluid or free gas. No abdominal wall hernia. Musculoskeletal: No acute or destructive bony lesions. Reconstructed images demonstrate no additional findings. IMPRESSION: No acute intra-abdominal or intrapelvic process. Electronically Signed   By: Randa Ngo M.D.   On: 06/20/2020 21:25      ____________________________________________   INITIAL IMPRESSION / ASSESSMENT AND PLAN / ED COURSE  As part of my medical decision making, I reviewed the following data within the [place] notes reviewed and incorporated        Khaleelah Yowell is a 59 year old female who reports to the emergency department for evaluation of right-sided low back pain.  She states she woke up with it this morning.  Patient is concerned it is like previous kidney stone.  Urinalysis does show trace leukocytes and many bacteria.  A CT renal study was performed for evaluation of renal stone given positive CVA tenderness.  This was negative.  Patient was treated with Toradol in the emergency department.  She was treated for acute cystitis with Keflex and Flexeril and prednisone taper for musculoskeletal low back pain.  The patient will follow up with her primary care should she have ongoing back pain.  Return precautions were given  Jenisis Harmsen was evaluated in Emergency Department on 06/20/2020 for the symptoms described in the history of present illness. She was evaluated in the context of  the global COVID-19 pandemic, which necessitated consideration that the patient might be at risk for infection with the SARS-CoV-2 virus that causes COVID-19. Institutional protocols and algorithms that pertain to the evaluation of patients at risk for COVID-19 are in a state of rapid change based on information released by regulatory bodies including the CDC and federal and state organizations. These policies and algorithms were followed during the patient's care in the ED.  ____________________________________________   FINAL CLINICAL IMPRESSION(S) / ED DIAGNOSES  Final diagnoses:  Acute cystitis without hematuria  Acute right-sided low back pain without sciatica     ED Discharge Orders         Ordered    cephALEXin (KEFLEX) 500 MG capsule  2 times daily        06/20/20 2154    cyclobenzaprine (FLEXERIL) 5 MG tablet  3 times daily PRN        06/20/20 2154    predniSONE (DELTASONE) 10 MG tablet        06/20/20 2209           Note:  This document was prepared using Dragon voice recognition software and may include unintentional dictation errors.    Marlana Salvage, PA 06/20/20 Joie Bimler    Duffy Bruce, MD 06/25/20 [phone]

## 2020-06-21 ENCOUNTER — Other Ambulatory Visit: Payer: Self-pay | Admitting: Family Medicine

## 2020-06-21 ENCOUNTER — Other Ambulatory Visit: Payer: Self-pay | Admitting: Cardiovascular Disease

## 2020-06-21 DIAGNOSIS — I10 Essential (primary) hypertension: Secondary | ICD-10-CM

## 2020-06-21 NOTE — Telephone Encounter (Signed)
Requested medication (s) are due for refill today: yes  Requested medication (s) are on the active medication list: yes  Last refill:  01/18/20  Future visit scheduled: no  Notes to clinic:  attempted to call pt to ake appt- "call could not be completed at this time. Checked the number and got same recording x 3- no other number listed in chart   Requested Prescriptions  Pending Prescriptions Disp Refills   hydrochlorothiazide (HYDRODIURIL) 25 MG tablet [Pharmacy Med Name: hydroCHLOROthiazide 25 MG Oral Tablet] 90 tablet 0    Sig: Take 1 tablet by mouth once daily      Cardiovascular: Diuretics - Thiazide Failed - 06/21/2020 11:58 AM      Failed - Ca in normal range and within 360 days    Calcium  Date Value Ref Range Status  06/20/2020 8.8 (L) 8.9 - 10.3 mg/dL Final          Failed - Last BP in normal range    BP Readings from Last 1 Encounters:  06/20/20 (!) 162/67          Failed - Valid encounter within last 6 months    Recent Outpatient Visits           7 months ago Large breasts   [place], DO   1 year ago Chest pain, exertional   [facility] Puma, Jerrel Ivory, NP   1 year ago Irritant contact dermatitis, unspecified trigger   [facility] Mikey College, NP   2 years ago Influenza A   [facility] Merrilyn Puma, Jerrel Ivory, NP   2 years ago Essential hypertension   [place], Jerrel Ivory, NP       Future Appointments             In 1 month Fletcher Anon, Mertie Clause, MD [place], [facility]            [place] in normal range and within 360 days    Creat  Date Value Ref Range Status  01/05/2019 0.92 0.50 - 1.05 mg/dL Final    Comment:    For patients >69 years of age, the reference limit for Creatinine is approximately 13% higher for people identified as African-American. .    Creatinine, Ser  Date Value Ref  Range Status  06/20/2020 0.75 0.44 - 1.00 mg/dL Final          Passed - K in normal range and within 360 days    Potassium  Date Value Ref Range Status  06/20/2020 3.7 3.5 - 5.1 mmol/L Final          Passed - Na in normal range and within 360 days    Sodium  Date Value Ref Range Status  06/20/2020 140 135 - 145 mmol/L Final  11/20/2019 141 134 - 144 mmol/L Final

## 2020-06-22 LAB — URINE CULTURE

## 2020-07-11 ENCOUNTER — Telehealth: Payer: Self-pay | Admitting: Cardiovascular Disease

## 2020-07-11 ENCOUNTER — Other Ambulatory Visit: Payer: Self-pay | Admitting: *Deleted

## 2020-07-11 DIAGNOSIS — I10 Essential (primary) hypertension: Secondary | ICD-10-CM

## 2020-07-11 MED ORDER — SPIRONOLACTONE 25 MG PO TABS: 25.0000 mg | ORAL_TABLET | Freq: Every day | ORAL | 0 refills | Status: DC

## 2020-07-11 MED ORDER — HYDROCHLOROTHIAZIDE 25 MG PO TABS: 25.0000 mg | ORAL_TABLET | Freq: Every day | ORAL | 0 refills | Status: DC

## 2020-07-11 MED ORDER — CARVEDILOL 6.25 MG PO TABS: 6.2500 mg | ORAL_TABLET | Freq: Two times a day (BID) | ORAL | 0 refills | Status: DC

## 2020-07-11 MED ORDER — AMLODIPINE BESYLATE 5 MG PO TABS: 5.0000 mg | ORAL_TABLET | Freq: Every day | ORAL | 0 refills | Status: DC

## 2020-07-11 NOTE — Telephone Encounter (Signed)
*  STAT* If patient is at the pharmacy, call can be transferred to refill team.   1. Which medications need to be refilled? (please list name of each medication and dose if known)    ASA 81 MG PO Q D  AMLODIPINE 5 MG PO Q D  CARVEDILOL 6.25 MG PO BID HCTZ 25 MG PO Q D  SPIRONOLACTONE 25 MG PO Q D   2. Which pharmacy/location (including street and city if local pharmacy) is medication to be sent to?  [place]   3. Do they need a 30 day or 90 day supply? [place]

## 2020-07-11 NOTE — Telephone Encounter (Signed)
Requested Prescriptions   Pending Prescriptions Disp Refills   aspirin EC 81 MG tablet 90 tablet 0    Sig: Take 1 tablet (81 mg total) by mouth daily.   Signed Prescriptions Disp Refills   amLODipine (NORVASC) 5 MG tablet 90 tablet 0    Sig: Take 1 tablet (5 mg total) by mouth daily.    Authorizing Provider: Loel Dubonnet    Ordering User: Eugenio Hoes, [place] Ndiaye C   carvedilol (COREG) 6.25 MG tablet 180 tablet 0    Sig: Take 1 tablet (6.25 mg total) by mouth 2 (two) times daily.    Authorizing Provider: Loel Dubonnet    Ordering User: Othelia Pulling C   hydrochlorothiazide (HYDRODIURIL) 25 MG tablet 90 tablet 0    Sig: Take 1 tablet (25 mg total) by mouth daily.    Authorizing Provider: Loel Dubonnet    Ordering User: Britt Bottom   spironolactone (ALDACTONE) 25 MG tablet 90 tablet 0    Sig: Take 1 tablet (25 mg total) by mouth daily.    Authorizing Provider: Loel Dubonnet    Ordering User: Britt Bottom

## 2020-07-11 NOTE — Telephone Encounter (Signed)
Requested Prescriptions   Pending Prescriptions Disp Refills   aspirin EC 81 MG tablet 90 tablet 0    Sig: Take 1 tablet (81 mg total) by mouth daily.   Signed Prescriptions Disp Refills   amLODipine (NORVASC) 5 MG tablet 90 tablet 0    Sig: Take 1 tablet (5 mg total) by mouth daily.    Authorizing Provider: Loel Dubonnet    Ordering User: Eugenio Hoes, Alvah Lagrow C   carvedilol (COREG) 6.25 MG tablet 180 tablet 0    Sig: Take 1 tablet (6.25 mg total) by mouth 2 (two) times daily.    Authorizing Provider: Loel Dubonnet    Ordering User: Othelia Pulling C   hydrochlorothiazide (HYDRODIURIL) 25 MG tablet 90 tablet 0    Sig: Take 1 tablet (25 mg total) by mouth daily.    Authorizing Provider: Loel Dubonnet    Ordering User: Britt Bottom   spironolactone (ALDACTONE) 25 MG tablet 90 tablet 0    Sig: Take 1 tablet (25 mg total) by mouth daily.    Authorizing Provider: Loel Dubonnet    Ordering User: Britt Bottom

## 2020-07-29 ENCOUNTER — Ambulatory Visit: Payer: PRIVATE HEALTH INSURANCE | Admitting: Cardiovascular Disease

## 2020-07-30 ENCOUNTER — Encounter: Payer: Self-pay | Admitting: Cardiovascular Disease

## 2021-04-16 ENCOUNTER — Ambulatory Visit: Payer: Managed Care, Other (non HMO) | Admitting: Cardiovascular Disease

## 2021-05-05 ENCOUNTER — Ambulatory Visit (INDEPENDENT_AMBULATORY_CARE_PROVIDER_SITE_OTHER): Payer: PRIVATE HEALTH INSURANCE | Admitting: Cardiovascular Disease

## 2021-05-05 ENCOUNTER — Other Ambulatory Visit: Payer: Self-pay

## 2021-05-05 ENCOUNTER — Encounter: Payer: Self-pay | Admitting: Cardiovascular Disease

## 2021-05-05 VITALS — BP 168/90 | HR 63 | Ht 69.0 in | Wt 304.0 lb

## 2021-05-05 DIAGNOSIS — Z720 Tobacco use: Secondary | ICD-10-CM

## 2021-05-05 DIAGNOSIS — E785 Hyperlipidemia, unspecified: Secondary | ICD-10-CM

## 2021-05-05 DIAGNOSIS — R0602 Shortness of breath: Secondary | ICD-10-CM

## 2021-05-05 DIAGNOSIS — I10 Essential (primary) hypertension: Secondary | ICD-10-CM

## 2021-05-05 DIAGNOSIS — R600 Localized edema: Secondary | ICD-10-CM | POA: Diagnosis not present

## 2021-05-05 MED ORDER — SPIRONOLACTONE 25 MG PO TABS: 25.0000 mg | ORAL_TABLET | Freq: Every day | ORAL | 3 refills | Status: DC

## 2021-05-05 MED ORDER — CARVEDILOL 6.25 MG PO TABS: 6.2500 mg | ORAL_TABLET | Freq: Two times a day (BID) | ORAL | 3 refills | Status: DC

## 2021-05-05 MED ORDER — AMLODIPINE BESYLATE 5 MG PO TABS: 5.0000 mg | ORAL_TABLET | Freq: Every day | ORAL | 3 refills | Status: DC

## 2021-05-05 MED ORDER — HYDROCHLOROTHIAZIDE 25 MG PO TABS: 25.0000 mg | ORAL_TABLET | Freq: Every day | ORAL | 3 refills | Status: DC

## 2021-05-05 NOTE — Patient Instructions (Signed)
Medication Instructions:  Your physician recommends that you continue on your current medications as directed. Please refer to the Current Medication list given to you today.  *If you need a refill on your cardiac medications before your next appointment, please call your pharmacy*   Lab Work:  Your physician recommends that you return for lab work in: in one week (05/11/21)  - Please go to the [facility]. You will check in at the front desk to the right as you walk into the atrium. Valet Parking is offered if needed. - No appointment needed. You may go any day between 7 am and 6 pm.    Testing/Procedures: Your physician has requested that you have an echocardiogram. Echocardiography is a painless test that uses sound waves to create images of your heart. It provides your doctor with information about the size and shape of your heart and how well your heart's chambers and valves are working. This procedure takes approximately one hour. There are no restrictions for this procedure.    Follow-Up: At [facility], you and your health needs are our priority.  As part of our continuing mission to provide you with exceptional heart care, we have created designated Provider Care Teams.  These Care Teams include your primary Cardiologist (physician) and Advanced Practice Providers (APPs -  Physician Assistants and Nurse Practitioners) who all work together to provide you with the care you need, when you need it.  We recommend signing up for the patient portal called "MyChart".  Sign up information is provided on this After Visit Summary.  MyChart is used to connect with patients for Virtual Visits (Telemedicine).  Patients are able to view lab/test results, encounter notes, upcoming appointments, etc.  Non-urgent messages can be sent to your provider as well.   To learn more about what you can do with MyChart, go to [email or web address].    Your next appointment:   3 month(s)  The  format for your next appointment:   In Person  Provider:   You may see Kathlyn Sacramento, MD or one of the following Advanced Practice Providers on your designated Care Team:   Murray Hodgkins, NP Christell Faith, PA-C Marrianne Mood, PA-C Cadence Tustin, [place] Laurann Montana, NP   Other Instructions

## 2021-05-05 NOTE — Progress Notes (Signed)
Cardiology Office Note   Date:  05/05/2021   ID:  [name], [name] 1960-12-26, MRN [id number]  PCP:  Shannon College, NP (Inactive)  Cardiologist: Shannon Sacramento, MD  Chief Complaint  Patient presents with   Other    OD 3-4 month f/u LS 03/2020 c/o edema legs/ankles, blurred vision and headaches. Pt currently out of all meds. Meds reviewed verbally with pt.      History of Present Illness: Shannon Bryan is a 60 y.o. female who presents for a follow up visit regarding exertional chest pain and shortness of breath. She has known history of essential hypertension, mild hyperlipidemia, tobacco use and obesity. She was seen in March, 2020 for worsening exertional dyspnea and chest pain in the setting of uncontrolled hypertension with blood pressure as high as 676 systolic.  She was on hydrochlorothiazide 25 mg once daily.  I added carvedilol 6.25 mg twice daily.  She underwent a Lexiscan Myoview which showed fixed anterior wall defect likely due to breast attenuation.  Ejection fraction estimation was not accurate.  Echocardiogram showed an EF of 55 to 60% with no significant valvular abnormalities or pulmonary hypertension.  She had significant stress last year related to COVID-related mortality affecting her brother-in-law. 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  She had breast reduction surgery in May 2021. She has not been seen in our office in more than 1 year and ran out of her blood pressure medications few months ago.  Her blood pressure has been running extremely high with associated headache and blurred vision.  In addition, she reports worsening shortness of breath.  She had COVID infection recently for the third time.    Past Medical History:  Diagnosis Date   Asthma    Blood clot in vein    Chronic kidney disease    Hyperlipidemia    Hypertension    Kidney stone     Past Surgical History:  Procedure Laterality Date   ABDOMINAL HYSTERECTOMY      ABLATION     BREAST RECONSTRUCTION WITH MASTOPLASTY Bilateral 03/11/2020   Procedure: BREAST RECONSTRUCTION WITH MASTOPLASTY;  Surgeon: Shannon Roys, MD;  Location: [facility];  Service: Plastics;  Laterality: Bilateral;   CHOLECYSTECTOMY     ROTATOR CUFF REPAIR Right    TUBAL LIGATION       Current Outpatient Medications  Medication Sig Dispense Refill   acetaminophen (TYLENOL) 500 MG tablet Take 500 mg by mouth every 6 (six) hours as needed.     amLODipine (NORVASC) 5 MG tablet Take 1 tablet (5 mg total) by mouth daily. 90 tablet 0   aspirin EC 81 MG tablet Take 81 mg by mouth daily.     carvedilol (COREG) 6.25 MG tablet Take 1 tablet (6.25 mg total) by mouth 2 (two) times daily. 180 tablet 0   cetirizine (ZYRTEC) 10 MG tablet Take 10 mg by mouth daily.     hydrochlorothiazide (HYDRODIURIL) 25 MG tablet Take 1 tablet (25 mg total) by mouth daily. 90 tablet 0   ibuprofen (ADVIL) 800 MG tablet Take 1 tablet (800 mg total) by mouth 3 (three) times daily as needed. 90 tablet 0   ipratropium (ATROVENT) 0.06 % nasal spray Place 2 sprays into both nostrils 4 (four) times daily. 15 mL 0   Multiple Vitamins-Minerals (ONE-A-DAY 50 PLUS PO) Take 1 tablet by mouth daily.     spironolactone (ALDACTONE) 25 MG tablet Take 1 tablet (25 mg total) by mouth  daily. 90 tablet 0   traZODone (DESYREL) 50 MG tablet Take 0.5-1 tablets (25-50 mg total) by mouth at bedtime as needed for sleep. 30 tablet 5   No current facility-administered medications for this visit.    Allergies:   Bactrim [sulfamethoxazole-trimethoprim], Penicillins, and Cleocin [clindamycin hcl]    Social History:  The patient  reports that she quit smoking about 15 months ago. She has a 7.50 pack-year smoking history. She has quit using smokeless tobacco. She reports current alcohol use. She reports that she does not use drugs.   Family History:  The patient's family history includes Cancer in her mother;  Depression in her mother; Diabetes in her mother; Emphysema in her father; Heart disease in her mother; Stroke in her mother.    ROS:  Please see the history of present illness.   Otherwise, review of systems are positive for none.   All other systems are reviewed and negative.    PHYSICAL EXAM: VS:  BP (!) 168/90 (BP Location: Left Arm, Patient Position: Sitting, Cuff Size: Large)   Pulse 63   Ht 5\' 9"  (1.753 m)   Wt (!) 304 lb (137.9 kg)   SpO2 98%   BMI 44.89 kg/m  , BMI Body mass index is 44.89 kg/m. GEN: Well nourished, well developed, in no acute distress HEENT: normal Neck: no JVD, carotid bruits, or masses Cardiac: RRR; no murmurs, rubs, or gallops, trace edema  Respiratory:  clear to auscultation bilaterally, normal work of breathing GI: soft, nontender, nondistended, + BS MS: no deformity or atrophy Skin: warm and dry, no rash Neuro:  Strength and sensation are intact Psych: euthymic mood, full affect   EKG:  EKG is ordered today. The ekg ordered today demonstrates normal sinus rhythm with low voltage and nonspecific T wave changes.   Recent Labs: 06/20/2020: ALT 20; BUN 7; Creatinine, Ser 0.75; Hemoglobin 14.0; Platelets 268; Potassium 3.7; Sodium 140    Lipid Panel    Component Value Date/Time   CHOL 176 03/29/2017 0833   TRIG 99 03/29/2017 0833   HDL 42 (L) 03/29/2017 0833   CHOLHDL 4.2 03/29/2017 0833   VLDL 20 03/29/2017 0833   LDLCALC 114 (H) 03/29/2017 0833      Wt Readings from Last 3 Encounters:  05/05/21 (!) 304 lb (137.9 kg)  06/20/20 300 lb (136.1 kg)  03/27/20 (!) 302 lb 6 oz (137.2 kg)       ASSESSMENT AND PLAN:  1.  Exertional dyspnea and chest pain: She has recurrent symptoms in the setting of uncontrolled hypertension and running out of her antihypertensive medications over the last few months.  I refilled all her blood pressure medications and discussed with her the importance of compliance.  I am going to recheck labs in 1 week  including CBC and basic metabolic profile given that she is on multiple antihypertensive medications.   Given worsening exertional dyspnea and recent COVID infection, I requested an echocardiogram to ensure no cardiomyopathy or pericardial disease. If symptoms persist after controlling blood pressure, she might require a right and left cardiac catheterization given that her ischemic cardiac evaluation was suboptimal due to body habitus.   2.  Essential hypertension: Blood pressure is extremely high due to running out of antihypertensive medications.  These were refilled today.   3.  Mild hyperlipidemia: if coronary artery disease is found, treatment with a statin would be indicated.   4.  Tobacco use: I again discussed with her the importance of smoking cessation.  Disposition:   FU with me in 3 months   Signed, Shannon Sacramento, MD 05/05/21 [place], [place]

## 2021-05-11 ENCOUNTER — Other Ambulatory Visit
Admission: RE | Admit: 2021-05-11 | Discharge: 2021-05-11 | Disposition: A | Discharge status: Home / Self Care | Payer: 59 | Attending: Cardiovascular Disease | Admitting: Cardiovascular Disease

## 2021-05-11 DIAGNOSIS — I10 Essential (primary) hypertension: Secondary | ICD-10-CM | POA: Diagnosis present

## 2021-05-11 LAB — CBC
HCT: 46 % (ref 36.0–46.0)
Hemoglobin: 15.2 g/dL — ABNORMAL HIGH (ref 12.0–15.0)
MCH: 27.2 pg (ref 26.0–34.0)
MCHC: 33 g/dL (ref 30.0–36.0)
MCV: 82.3 fL (ref 80.0–100.0)
Platelets: 334 10*3/uL (ref 150–400)
RBC: 5.59 MIL/uL — ABNORMAL HIGH (ref 3.87–5.11)
RDW: 15.3 % (ref 11.5–15.5)
WBC: 8.5 10*3/uL (ref 4.0–10.5)
nRBC: 0 % (ref 0.0–0.2)

## 2021-05-11 LAB — BASIC METABOLIC PANEL
Anion gap: 10 (ref 5–15)
BUN: 9 mg/dL (ref 6–20)
CO2: 26 mmol/L (ref 22–32)
Calcium: 9.2 mg/dL (ref 8.9–10.3)
Chloride: 100 mmol/L (ref 98–111)
Creatinine, Ser: 0.87 mg/dL (ref 0.44–1.00)
GFR, Estimated: >60 mL/min (ref >60)
Glucose, Bld: 178 mg/dL — ABNORMAL HIGH (ref 70–99)
Potassium: 3.3 mmol/L — ABNORMAL LOW (ref 3.5–5.1)
Sodium: 136 mmol/L (ref 135–145)

## 2021-05-12 ENCOUNTER — Telehealth: Payer: Self-pay

## 2021-05-12 DIAGNOSIS — E876 Hypokalemia: Secondary | ICD-10-CM

## 2021-05-12 MED ORDER — POTASSIUM CHLORIDE CRYS ER 20 MEQ PO TBCR: 20.0000 meq | EXTENDED_RELEASE_TABLET | Freq: Every day | ORAL | 2 refills | Status: DC

## 2021-05-12 NOTE — Telephone Encounter (Signed)
-----   Message from Wellington Hampshire, MD sent at 05/11/2021  5:44 PM EDT ----- Inform patient that labs were normal except for mild hypokalemia.  Add K-dur 20 mEq once daily and repeat basic metabolic profile in 2 to 3 weeks.

## 2021-05-12 NOTE — Telephone Encounter (Signed)
Patient made aware of lab results and Dr. Tyrell Antonio recommendation. Rx for Potassium 20 meq daily has been sent to the patients pharmacy. Order placed for repeat 2-3 bmp.  Patient is sch for a an echo on 06/02/21. Msg fwd to scheduling to schedule a lab appt same day.  Patient verbalized understanding to the info given and voiced appreciation for the call.

## 2021-05-12 NOTE — Telephone Encounter (Signed)
Called to give the patient lab results and Dr. Tyrell Antonio recommendation.  Unable to lmom. Patients voicemail is full. Will atempt to reach the patient again.

## 2021-06-02 ENCOUNTER — Ambulatory Visit: Payer: Managed Care, Other (non HMO)

## 2021-06-02 ENCOUNTER — Other Ambulatory Visit: Payer: Self-pay

## 2021-06-02 ENCOUNTER — Ambulatory Visit (INDEPENDENT_AMBULATORY_CARE_PROVIDER_SITE_OTHER): Payer: PRIVATE HEALTH INSURANCE

## 2021-06-02 DIAGNOSIS — R0602 Shortness of breath: Secondary | ICD-10-CM | POA: Diagnosis not present

## 2021-06-02 DIAGNOSIS — E876 Hypokalemia: Secondary | ICD-10-CM

## 2021-06-03 LAB — ECHOCARDIOGRAM COMPLETE
AR max vel: 2.14 cm2
AV Area VTI: 2.13 cm2
AV Area mean vel: 2.19 cm2
AV Mean grad: 5 mmHg
AV Peak grad: 9.1 mmHg
Ao pk vel: 1.51 m/s
Area-P 1/2: 3.72 cm2
Calc EF: 57 %
P 1/2 time: 630 msec
S' Lateral: 3.3 cm
Single Plane A2C EF: 56.9 %
Single Plane A4C EF: 55.1 %

## 2021-06-03 LAB — BASIC METABOLIC PANEL
BUN/Creatinine Ratio: 11 (ref 9–23)
BUN: 10 mg/dL (ref 6–24)
CO2: 20 mmol/L (ref 20–29)
Calcium: 9.5 mg/dL (ref 8.7–10.2)
Chloride: 104 mmol/L (ref 96–106)
Creatinine, Ser: 0.92 mg/dL (ref 0.57–1.00)
Glucose: 106 mg/dL — ABNORMAL HIGH (ref 65–99)
Potassium: 4.5 mmol/L (ref 3.5–5.2)
Sodium: 140 mmol/L (ref 134–144)
eGFR: 72 mL/min/{1.73_m2} (ref >59)

## 2021-06-04 NOTE — Progress Notes (Signed)
Cardiology Office Note    Date:  06/11/2021   ID:  Shannon Bryan 30-Apr-1961, MRN [id number]  PCP:  Mikey College, NP (Inactive)  Cardiologist:  Kathlyn Sacramento, MD  Electrophysiologist:  None   Chief Complaint: Follow-up echo and hypertension  History of Present Illness:   Shannon Bryan is a 60 y.o. female with history of HTN, mild HLD, COVID, tobacco use, and obesity who presents for follow up of echo and hypertension.   She was seen in 12/2018 with worsening exertional dyspnea and chest pain in the setting of uncontrolled hypertension with BP as high as [id number] mmHg systolic. She was on HCTZ at that time. Coreg was added. She underwent Lexiscan Myoview in 04/2019 which showed a fixed anterior wall defect likely secondary to breast attenuation artifact. EF estimation was inaccurate. In this setting, she underwent echo in 04/2019, which showed an EF of 55-60% with no significant valvular abnormalities or pulmonary hypertension.  She was under increased stress in 05/2019 with her brother having passed away with COVID-19 and having had multiple family members sick with the virus. She was seen in the office in 10/2019 for follow up and had been out of her amlodipine. She noted pedal swelling that was worse throughout the day and improved with elevation and in the mornings. She also noted some right-sided chest pain and back pain with increased activity and stress. BP was poorly controlled at 150/90. She was started on spironolactone and amlodipine was resumed.  In follow-up in 11/2019 she was doing very well and noted since initiation of spironolactone her lower extremity swelling had resolved.  Her weight was down 10 pounds compared to her last visit.  She was last seen in the office on 05/05/2021 after having run out of her blood pressure medications several months prior.  Her BP has been running extremely high with associated chest discomfort, dyspnea, headaches, and blurred vision.  BP was  elevated at 168/90.  These symptoms were similar to her prior presentation in the setting of uncontrolled hypertension.  She reported having had a COVID infection for the third time.  Her weight was stable.  Her antihypertensive medications were resumed at that time.  Given her recent COVID infection and exertional symptoms, she underwent echo on 06/02/2021 which showed an EF of 55 to 60%, no regional wall motion abnormalities, normal RV systolic function and ventricular cavity size, and trivial mitral regurgitation.  She comes in today reporting significant improvement in her home BP readings following resumption of antihypertensives.  She does continue to note exertional dyspnea and chest tightness that are largely unchanged when compared to her last visit.  Symptoms improved with rest after approximately 5 to 10 minutes.  She is currently chest pain-free.  No lower extremity swelling, abdominal distention, or worsening orthopnea.  At baseline, she reports sleeping with 4 pillows.  Her weight remains stable.  She is interested in quitting smoking as her sister was recently diagnosed with lung cancer.  She has previously failed patches and gum.  She is interested in Chantix.   Sleep Apnea Evaluation   STOP-BANG RISK ASSESSMENT  {  STOP-BANG 06/11/2021  Do you snore loudly? Yes  Do you often feel tired, fatigued, or sleepy during the daytime? Yes  Has anyone observed you stop breathing during sleep? Yes  Do you have (or are you being treated for) high blood pressure? Yes  Recent BMI (Calculated) 44.93  Is BMI greater than 35 kg/m2? 1=Yes  Age older than 58  years old? 1=Yes  Has large neck size > 40 cm (15.7 in, large female shirt size, large female collar size > 16) Yes  Gender - Female 0=No  STOP-Bang Total Score 7      If STOP-BANG Score ?3 OR two clinical symptoms - patient qualifies for WatchPAT (CPT 95800)         Labs independently reviewed: 06/2021 - BUN 10, serum creatinine 0.92, potassium  4.5 05/2021 - Hgb 15.2, PLT 334 07/2020 - albumin 4.1, AST/ALT normal 12/2018 - A1c 6.3, magnesium 2.2, TSH normal 03/2017 - TC 176, TG 99, HDL 42, LDL 114  Past Medical History:  Diagnosis Date   Asthma    Blood clot in vein    Chronic kidney disease    Hyperlipidemia    Hypertension    Kidney stone     Past Surgical History:  Procedure Laterality Date   ABDOMINAL HYSTERECTOMY     ABLATION     BREAST RECONSTRUCTION WITH MASTOPLASTY Bilateral 03/11/2020   Procedure: BREAST RECONSTRUCTION WITH MASTOPLASTY;  Surgeon: Youlanda Roys, MD;  Location: [facility];  Service: Plastics;  Laterality: Bilateral;   CHOLECYSTECTOMY     ROTATOR CUFF REPAIR Right    TUBAL LIGATION      Current Medications: Current Meds  Medication Sig   acetaminophen (TYLENOL) 500 MG tablet Take 500 mg by mouth every 6 (six) hours as needed.   amLODipine (NORVASC) 5 MG tablet Take 1 tablet (5 mg total) by mouth daily.   aspirin EC 81 MG tablet Take 81 mg by mouth daily.   carvedilol (COREG) 6.25 MG tablet Take 1 tablet (6.25 mg total) by mouth 2 (two) times daily.   cetirizine (ZYRTEC) 10 MG tablet Take 10 mg by mouth daily.   hydrochlorothiazide (HYDRODIURIL) 25 MG tablet Take 1 tablet (25 mg total) by mouth daily.   ibuprofen (ADVIL) 800 MG tablet Take 1 tablet (800 mg total) by mouth 3 (three) times daily as needed.   ipratropium (ATROVENT) 0.06 % nasal spray Place 2 sprays into both nostrils 4 (four) times daily.   metoprolol tartrate (LOPRESSOR) 50 MG tablet Take 1 tablet (50 mg total) by mouth once for 1 dose. Take 2 hours before your cardiac CTA   Multiple Vitamins-Minerals (ONE-A-DAY 50 PLUS PO) Take 1 tablet by mouth daily.   potassium chloride SA (KLOR-CON M20) 20 MEQ tablet Take 1 tablet (20 mEq total) by mouth daily.   spironolactone (ALDACTONE) 25 MG tablet Take 1 tablet (25 mg total) by mouth daily.   traZODone (DESYREL) 50 MG tablet Take 0.5-1 tablets (25-50 mg total) by  mouth at bedtime as needed for sleep.   [DISCONTINUED] varenicline (CHANTIX STARTING MONTH PAK) 0.5 MG X 11 & 1 MG X 42 tablet Take one 0.5 mg tablet by mouth once daily for 3 days, then increase to one 0.5 mg tablet twice daily for 4 days, then increase to one 1 mg tablet twice daily.    Allergies:   Bactrim [sulfamethoxazole-trimethoprim], Penicillins, Sulfa antibiotics, and Cleocin [clindamycin hcl]   Social History   Socioeconomic History   Marital status: Single    Spouse name: Not on file   Number of children: Not on file   Years of education: Not on file   Highest education level: Not on file  Occupational History   Not on file  Tobacco Use   Smoking status: Former    Packs/day: 0.25    Years: 30.00    Pack years: 7.50  Types: Cigarettes    Quit date: 02/03/2020    Years since quitting: 1.3   Smokeless tobacco: Former   Tobacco comments:    1/4 - 1/2 ppd had quit for 1 year in 2010  Vaping Use   Vaping Use: Some days   Devices: Vaping  Substance and Sexual Activity   Alcohol use: Yes    Comment: occasional   Drug use: No   Sexual activity: Never  Other Topics Concern   Not on file  Social History Narrative   Not on file   Social Determinants of Health   Financial Resource Strain: Not on file  Food Insecurity: Not on file  Transportation Needs: Not on file  Physical Activity: Not on file  Stress: Not on file  Social Connections: Not on file     Family History:  The patient's family history includes Cancer in her mother; Depression in her mother; Diabetes in her mother; Emphysema in her father; Heart disease in her mother; Stroke in her mother.  ROS:   Review of Systems  Constitutional:  Positive for malaise/fatigue. Negative for chills, diaphoresis, fever and weight loss.  HENT:  Negative for congestion.   Eyes:  Negative for discharge and redness.  Respiratory:  Positive for shortness of breath. Negative for cough, hemoptysis, sputum production and  wheezing.   Cardiovascular:  Positive for chest pain. Negative for palpitations, orthopnea, claudication, leg swelling and PND.  Gastrointestinal:  Negative for abdominal pain, blood in stool, heartburn, melena, nausea and vomiting.  Genitourinary:  Negative for hematuria.  Musculoskeletal:  Positive for myalgias. Negative for falls.  Skin:  Negative for rash.  Neurological:  Positive for weakness. Negative for dizziness, tingling, tremors, sensory change, speech change, focal weakness and loss of consciousness.  Endo/Heme/Allergies:  Does not bruise/bleed easily.  Psychiatric/Behavioral:  Negative for substance abuse. The patient is not nervous/anxious.   All other systems reviewed and are negative.   EKGs/Labs/Other Studies Reviewed:    Studies reviewed were summarized above. The additional studies were reviewed today:  2D echo 06/02/2021: 1. Left ventricular ejection fraction, by estimation, is 55 to 60%. The  left ventricle has normal function. The left ventricle has no regional  wall motion abnormalities. Left ventricular diastolic parameters are  indeterminate. The average left  ventricular global longitudinal strain is -18.6 %. The global longitudinal  strain is normal.   2. Right ventricular systolic function is normal. The right ventricular  size is normal.   3. The mitral valve is abnormal. Trivial mitral valve regurgitation. No  evidence of mitral stenosis.   4. The aortic valve is tricuspid. Aortic valve regurgitation is mild. No  aortic stenosis is present. __________  2D echo 04/20/2019: 1. The left ventricle has normal systolic function, with an ejection  fraction of 55-60%. The cavity size was normal. Left ventricular diastolic  parameters were normal. No evidence of left ventricular regional wall  motion abnormalities.   2. The right ventricle has normal systolic function. The cavity was  normal. There is no increase in right ventricular wall thickness. Right   ventricular systolic pressure is normal.   3. The mitral valve is grossly normal. There is mild mitral annular  calcification present.   4. The aortic valve was not well visualized.   5. The aortic root and ascending aorta are normal in size and structure.   6. The interatrial septum was not well visualized. __________  [facility] Adam MPI 04/16/2019: Abnormal, probably low risk pharmacologic myocardial perfusion stress test. There is  a moderate in size, mild in severity, fixed mid anteroseptal, apical anterior, apical septal, and apical defect that most likely represents artifact (attenuation) but cannot exclude scar. There is no significant ischemia. Left ventricular systolic function is mild to moderately reduced (LVEF 50% by Siemens calculation, 38% by QGS) with subtle apical/apical septal hypokinesis. No significant coronary artery calcification is seen on the attenuation correction CT. Aortic atherosclerosis is noted in the aortic arch. The sensitivity and specificity of this study are degraded by the patient's body habitus.   EKG:  EKG is ordered today.  The EKG ordered today demonstrates NSR, 60 bpm, low voltage, poor R wave progression along the precordial leads, no acute ST-T changes, no significant changes when compared to prior tracing  Recent Labs: 06/20/2020: ALT 20 05/11/2021: Hemoglobin 15.2; Platelets 334 06/02/2021: BUN 10; Creatinine, Ser 0.92; Potassium 4.5; Sodium 140  Recent Lipid Panel    Component Value Date/Time   CHOL 176 03/29/2017 0833   TRIG 99 03/29/2017 0833   HDL 42 (L) 03/29/2017 0833   CHOLHDL 4.2 03/29/2017 0833   VLDL 20 03/29/2017 0833   LDLCALC 114 (H) 03/29/2017 0833    PHYSICAL EXAM:    VS:  BP 140/80 (BP Location: Left Arm, Patient Position: Sitting, Cuff Size: Large)   Pulse 60   Ht '5\' 9"'$  (1.753 m)   Wt (!) 304 lb 6 oz (138.1 kg)   SpO2 99%   BMI 44.95 kg/m   BMI: Body mass index is 44.95 kg/m.  Physical Exam Vitals reviewed.   Constitutional:      Appearance: She is well-developed.  HENT:     Head: Normocephalic and atraumatic.  Eyes:     General:        Right eye: No discharge.        Left eye: No discharge.  Neck:     Vascular: No JVD.     Comments: Neck circumference 16.5 inches Cardiovascular:     Rate and Rhythm: Normal rate and regular rhythm.     Pulses:          Posterior tibial pulses are 2+ on the right side and 2+ on the left side.     Heart sounds: Normal heart sounds, S1 normal and S2 normal. Heart sounds not distant. No midsystolic click and no opening snap. No murmur heard.   No friction rub.  Pulmonary:     Effort: Pulmonary effort is normal. No respiratory distress.     Breath sounds: Normal breath sounds. No decreased breath sounds, wheezing or rales.  Chest:     Chest wall: No tenderness.  Abdominal:     General: There is no distension.     Palpations: Abdomen is soft.     Tenderness: There is no abdominal tenderness.  Musculoskeletal:     Cervical back: Normal range of motion.     Right lower leg: No edema.     Left lower leg: No edema.  Skin:    General: Skin is warm and dry.     Nails: There is no clubbing.  Neurological:     Mental Status: She is alert and oriented to person, place, and time.  Psychiatric:        Speech: Speech normal.        Behavior: Behavior normal.        Thought Content: Thought content normal.        Judgment: Judgment normal.    Wt Readings from Last 3 Encounters:  06/11/21 (!) 304  lb 6 oz (138.1 kg)  05/05/21 (!) 304 lb (137.9 kg)  06/20/20 300 lb (136.1 kg)     ASSESSMENT & PLAN:   HTN: Blood pressure is mildly elevated in the office today though she reports very well controlled at home.  Historically, her blood pressure has been well controlled on current regimen.  Given reported well-controlled home readings and previously noted well-controlled readings in the office under current regimen, no medication changes were made at this time.   She remains on amlodipine 5 mg daily, carvedilol 6.25 mg twice daily, HCTZ 25 mg daily, and spironolactone 25 mg daily.  Would be hesitant to titrate amlodipine further due to concerns for development of lower extremity swelling.  Heart rate precludes further titration of carvedilol.  Low-sodium diet recommended.  BMP obtained last week showed stable renal function and potassium.  Exertional dyspnea and precordial pain: Symptoms persist despite improvement in blood pressure.  Echo was reassuring as outlined above.  Schedule coronary CTA.  If this is unrevealing consider pulmonology evaluation.  Cannot exclude some degree of physical deconditioning in the context of morbid obesity contributing to her symptomology as well.  Mild hyperlipidemia: Not currently on a statin.  LDL 114 in 2018.  Based on coronary CTA results we may need to initiate statin therapy to achieve target LDL.  Tobacco use: Tobacco use has improved, previously smoking 100s, now smoking regular cigarettes.  She requests Chantix today.  Starter pack was prescribed.  Side effects discussed.  Morbid obesity: Weight loss is encouraged through heart healthy diet.  Sleep disordered breathing: STOP-BANG elevated at 7 as outlined above.  Schedule for WatchPAT sleep study.  Disposition: F/u with Dr. Fletcher Anon or an APP in 2 months.   Medication Adjustments/Labs and Tests Ordered: Current medicines are reviewed at length with the patient today.  Concerns regarding medicines are outlined above. Medication changes, Labs and Tests ordered today are summarized above and listed in the Patient Instructions accessible in Encounters.   Signed, Christell Faith, PA-C 06/11/2021 10:07 AM     [facility] [address] [place] [address] [place], [place] [address] [phone]

## 2021-06-11 ENCOUNTER — Ambulatory Visit (INDEPENDENT_AMBULATORY_CARE_PROVIDER_SITE_OTHER): Payer: PRIVATE HEALTH INSURANCE | Admitting: Physician Assistant

## 2021-06-11 ENCOUNTER — Other Ambulatory Visit: Payer: Self-pay

## 2021-06-11 ENCOUNTER — Encounter: Payer: Self-pay | Admitting: Physician Assistant

## 2021-06-11 VITALS — BP 140/80 | HR 60 | Ht 69.0 in | Wt 304.4 lb

## 2021-06-11 DIAGNOSIS — G4730 Sleep apnea, unspecified: Secondary | ICD-10-CM

## 2021-06-11 DIAGNOSIS — E785 Hyperlipidemia, unspecified: Secondary | ICD-10-CM

## 2021-06-11 DIAGNOSIS — R072 Precordial pain: Secondary | ICD-10-CM | POA: Diagnosis not present

## 2021-06-11 DIAGNOSIS — R0600 Dyspnea, unspecified: Secondary | ICD-10-CM | POA: Diagnosis not present

## 2021-06-11 DIAGNOSIS — Z720 Tobacco use: Secondary | ICD-10-CM

## 2021-06-11 DIAGNOSIS — I10 Essential (primary) hypertension: Secondary | ICD-10-CM | POA: Diagnosis not present

## 2021-06-11 DIAGNOSIS — Z79899 Other long term (current) drug therapy: Secondary | ICD-10-CM

## 2021-06-11 DIAGNOSIS — R0609 Other forms of dyspnea: Secondary | ICD-10-CM

## 2021-06-11 MED ORDER — VARENICLINE TARTRATE 0.5 MG X 11 & 1 MG X 42 PO MISC: ORAL | 0 refills | Status: DC

## 2021-06-11 MED ORDER — METOPROLOL TARTRATE 50 MG PO TABS: 50.0000 mg | ORAL_TABLET | Freq: Once | ORAL | 0 refills | Status: DC

## 2021-06-11 NOTE — Patient Instructions (Addendum)
Medication Instructions:   Please START Chantix starter pack Additional refills through your PCP Cassell Smiles, NP  *If you need a refill on your cardiac medications before your next appointment, please call your pharmacy*   Lab Work: [facility]  Testing/Procedures: Cardiac CTA   Follow-Up: At [facility], you and your health needs are our priority.  As part of our continuing mission to provide you with exceptional heart care, we have created designated Provider Care Teams.  These Care Teams include your primary Cardiologist (physician) and Advanced Practice Providers (APPs -  Physician Assistants and Nurse Practitioners) who all work together to provide you with the care you need, when you need it.  We recommend signing up for the patient portal called "MyChart".  Sign up information is provided on this After Visit Summary.  MyChart is used to connect with patients for Virtual Visits (Telemedicine).  Patients are able to view lab/test results, encounter notes, upcoming appointments, etc.  Non-urgent messages can be sent to your provider as well.   To learn more about what you can do with MyChart, go to [email or web address].    Your next appointment:   2 month(s)  The format for your next appointment:   In Person  Provider:   Christell Faith, PA-C   Other Instructions Cardiac CTA [facility] [place], [place] [address] [phone]  Please arrive 15 mins early for check-in and test prep.  On the Night Before the Test: Be sure to Drink plenty of water. Do not consume any caffeinated/decaffeinated beverages or chocolate 12 hours prior to your test. This includes decaf coffee Do not take any antihistamines 12 hours prior to your test. No Benadryl  On the Day of the Test: Drink plenty of water until 1 hour prior to the test. Do not eat any food 4 hours prior to the test. You may take your regular medications prior to  the test.  Take metoprolol (Lopressor) two hours prior to test. Lopressor 50 mg ( medication sent to your pharmacy) HOLD Spironolactone/Hydrochlorothiazide morning of the test. FEMALES- please wear underwire-free bra if available       After the Test: Drink plenty of water. After receiving IV contrast, you may experience a mild flushed feeling. This is normal. On occasion, you may experience a mild rash up to 24 hours after the test. This is not dangerous. If this occurs, you can take Benadryl 25 mg and increase your fluid intake. If you experience trouble breathing, this can be serious. If it is severe call 911 IMMEDIATELY. If it is mild, please call our office. If you take any of these medications: Glipizide/Metformin, Avandament, Glucavance, please do not take 48 hours after completing test unless otherwise instructed.   Once we have confirmed authorization from your insurance company, we will call you to set up a date and time for your test. Based on how quickly your insurance processes prior authorizations requests, please allow up to 4 weeks to be contacted for scheduling your Cardiac CT appointment. Be advised that routine Cardiac CT appointments could be scheduled as many as 8 weeks after your provider has ordered it.   For scheduling needs, including cancellations and rescheduling, please call [place], [phone].

## 2021-06-12 ENCOUNTER — Telehealth: Payer: Self-pay | Admitting: *Deleted

## 2021-06-12 DIAGNOSIS — R0609 Other forms of dyspnea: Secondary | ICD-10-CM

## 2021-06-12 DIAGNOSIS — R0600 Dyspnea, unspecified: Secondary | ICD-10-CM

## 2021-06-12 DIAGNOSIS — G4730 Sleep apnea, unspecified: Secondary | ICD-10-CM

## 2021-06-12 LAB — BASIC METABOLIC PANEL
BUN/Creatinine Ratio: 14 (ref 9–23)
BUN: 12 mg/dL (ref 6–24)
CO2: 21 mmol/L (ref 20–29)
Calcium: 8.8 mg/dL (ref 8.7–10.2)
Chloride: 102 mmol/L (ref 96–106)
Creatinine, Ser: 0.87 mg/dL (ref 0.57–1.00)
Glucose: 139 mg/dL — ABNORMAL HIGH (ref 65–99)
Potassium: 4 mmol/L (ref 3.5–5.2)
Sodium: 141 mmol/L (ref 134–144)
eGFR: 77 mL/min/{1.73_m2} (ref >59)

## 2021-06-12 NOTE — Telephone Encounter (Signed)
-----   Message from Rise Mu, PA-C sent at 06/12/2021  7:26 AM EDT ----- Random glucose mildly elevated - recommend she follow-up with PCP for updating of A1c if she has not done so recently  Otherwise, P coronary CT labs are okay

## 2021-06-12 NOTE — Telephone Encounter (Signed)
Reviewed results with patient. Also discussed WatchPat device and instructions regarding this device. Advised that we will call her when she can pick up device and instruction sheet. She verbalized understanding of our conversation, agreement with plan, and had no further questions at this time.

## 2021-06-12 NOTE — Telephone Encounter (Signed)
-----   Message from Rise Mu, PA-C sent at 06/11/2021 10:11 AM EDT ----- Shannon Bryan, I saw this patient in clinic on 8/11.  Can you please get her set up for a WatchPAT?  STOP-BANG is documented in my note.

## 2021-06-16 ENCOUNTER — Telehealth (HOSPITAL_COMMUNITY): Payer: Self-pay | Admitting: Emergency Medicine

## 2021-06-16 NOTE — Telephone Encounter (Signed)
Attempted to call patient regarding upcoming cardiac CT appointment. °Left message on voicemail with name and callback number °Theona Muhs RN Navigator Cardiac Imaging °[facility] °[phone] Office °[phone] Cell ° °

## 2021-06-16 NOTE — Telephone Encounter (Signed)
-----   Message from Valora Corporal, RN sent at 06/12/2021  3:12 PM EDT ----- Regarding: Shannon Bryan ordered for this patient. For sleep disorder breathing. This was ordered after she left the office and once she is approved we will enroll and she will then pick up the device. Please let me know if you have any further questions

## 2021-06-16 NOTE — Telephone Encounter (Signed)
Patient is calling to get status of her sleep study.

## 2021-06-16 NOTE — Telephone Encounter (Signed)
Per patient's insurance no PA is required. Ok to activate PIN. Call reference # [id number].

## 2021-06-16 NOTE — Telephone Encounter (Signed)
Attempted to call patient regarding upcoming cardiac CT appointment. °Left message on voicemail with name and callback number °Jourdan Durbin RN Navigator Cardiac Imaging °[facility] °[phone] Office °[phone] Cell ° °

## 2021-06-16 NOTE — Telephone Encounter (Signed)
Reaching out to patient to offer assistance regarding upcoming cardiac imaging study; pt verbalizes understanding of appt date/time, parking situation and where to check in, pre-test NPO status and medications ordered, and verified current allergies; name and call back number provided for further questions should they arise Marchia Bond RN Navigator Cardiac Imaging [facility] [phone] office [phone] cell  '50mg'$  metoprolol tart  Difficult IV stick Denies claustro

## 2021-06-17 NOTE — Addendum Note (Signed)
Addended by: Valora Corporal on: 06/17/2021 07:52 AM   Modules accepted: Orders

## 2021-06-17 NOTE — Telephone Encounter (Signed)
Spoke with patient and advised that she may come pick up her monitor whenever it is convenient for her. Reviewed that there would be a form to sign and information sheet as well when she does pick up monitor. She was appreciative for the call back with no further questions at this time.

## 2021-06-17 NOTE — Telephone Encounter (Addendum)
No answer/No voicemail. If patient calls back check with me.    From: Lauralee Evener, CMA  Sent: 06/16/2021   9:19 AM EDT   Subject: RE: Salvatore Marvel                                   Per patient's insurance no PA is required. Ok to activate. Call reference # [id number].

## 2021-06-18 ENCOUNTER — Other Ambulatory Visit: Payer: Self-pay

## 2021-06-18 ENCOUNTER — Ambulatory Visit
Admission: RE | Admit: 2021-06-18 | Discharge: 2021-06-18 | Disposition: A | Discharge status: Home / Self Care | Payer: 59 | Source: Ambulatory Visit | Attending: Physician Assistant | Admitting: Physician Assistant

## 2021-06-18 DIAGNOSIS — I10 Essential (primary) hypertension: Secondary | ICD-10-CM | POA: Diagnosis present

## 2021-06-18 DIAGNOSIS — R072 Precordial pain: Secondary | ICD-10-CM | POA: Insufficient documentation

## 2021-06-18 IMAGING — CT CT HEART MORP W/ CTA COR W/ SCORE W/ CA W/CM &/OR W/O CM
2 of 10 series · 8 of 20 positions shown, 10 images · non-contrast
Comparison: None.

Addendum:
CLINICAL DATA: Chest pain

EXAM:
Cardiac/Coronary  CTA
TECHNIQUE: The patient was scanned on a Siemens Somatom go.Top scanner.

[Series 26: multiphase % cta coronary 0.80 · axial · 0.35mm/px · z∈[-1119,-1029]mm · 5 of 3264 slices shown, 7 images]
[im 544/3264  vessel]
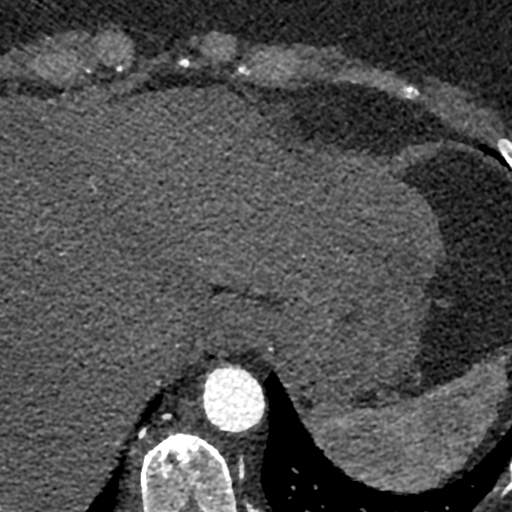
[im 544/3264  lung]
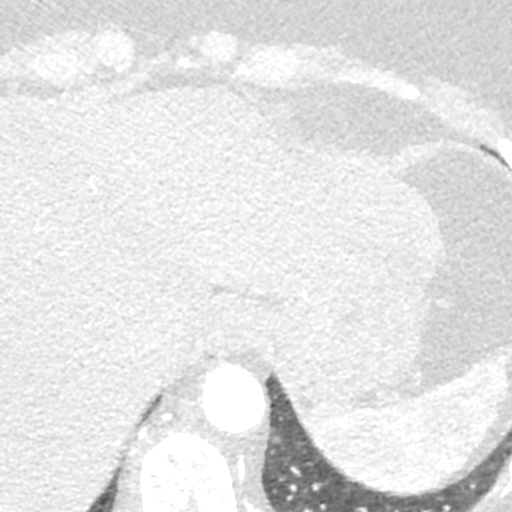
[im 1088/3264  vessel]
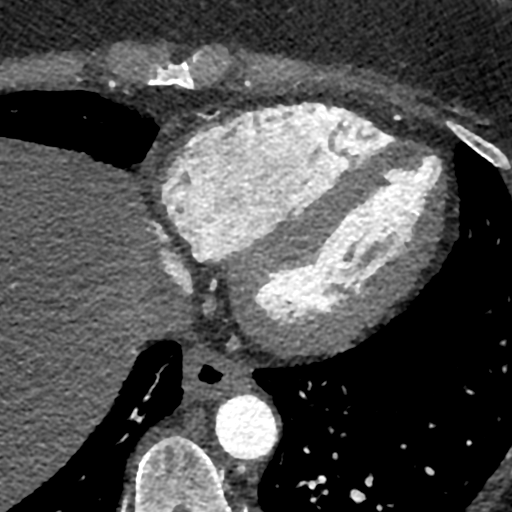
[im 1632/3264  vessel]
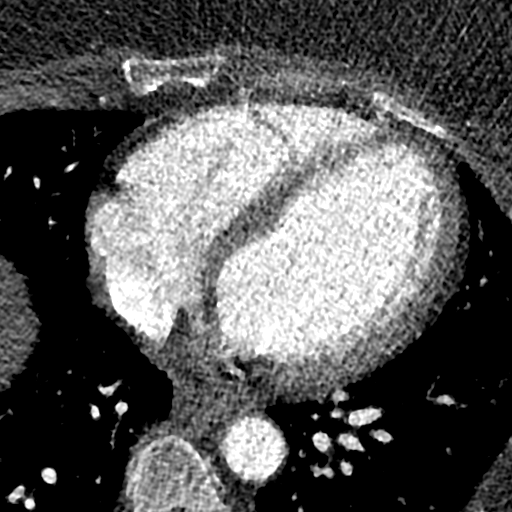
[im 2176/3264  vessel]
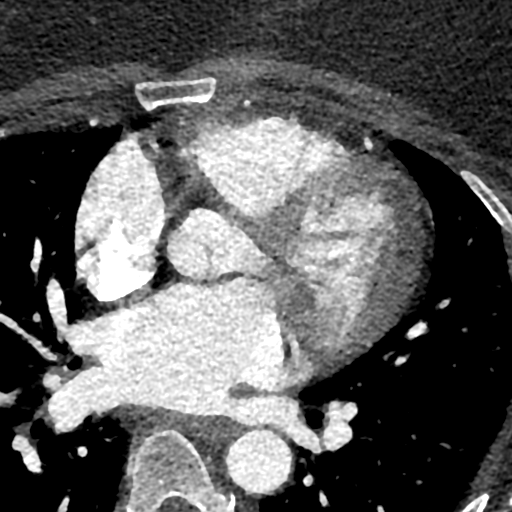
[im 2720/3264  vessel]
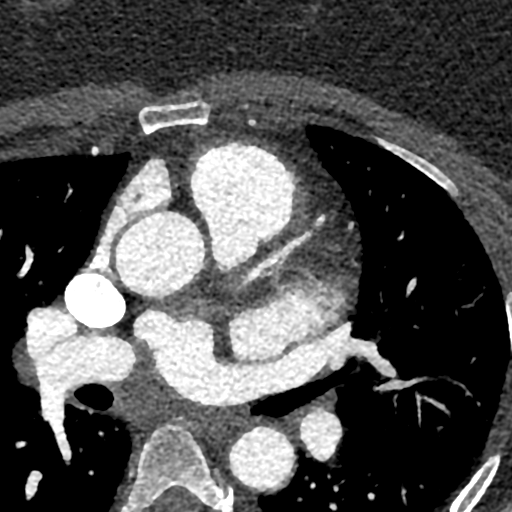
[im 2720/3264  lung]
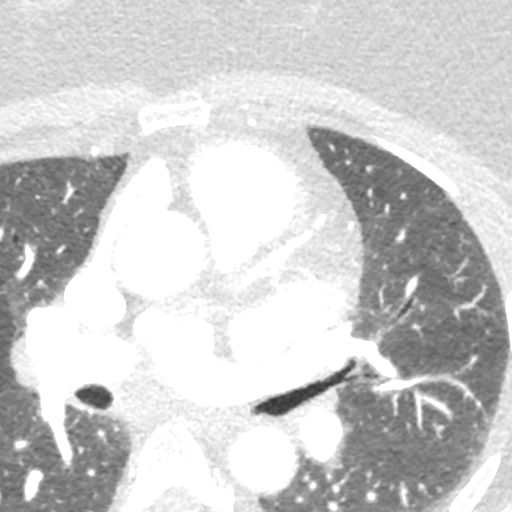

[Series 39: ms multiphase cta coronary 0.60 · axial · 0.35mm/px · z∈[-1108,-1040]mm · 3 of 2720 slices shown]
[im 680/2720  vessel]
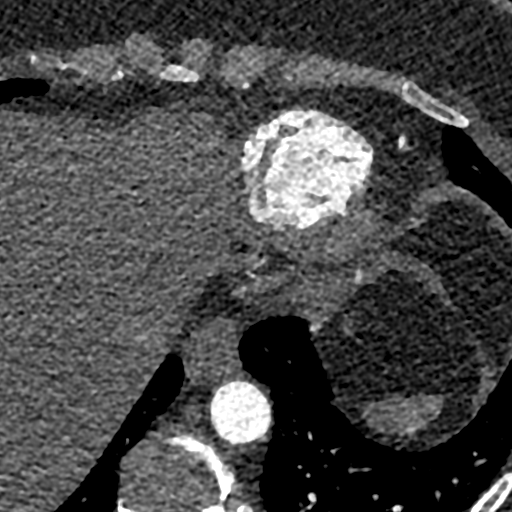
[im 1360/2720  vessel]
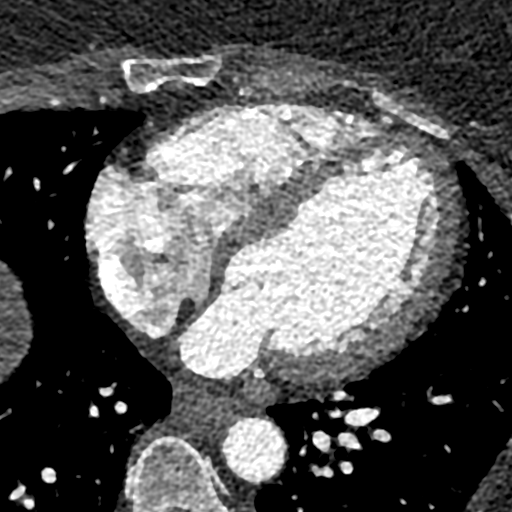
[im 2040/2720  vessel]
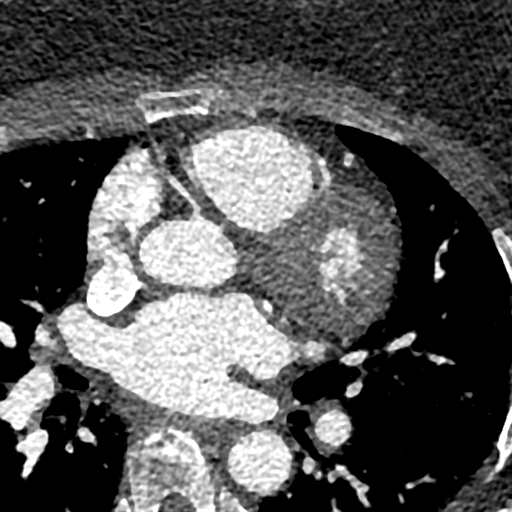

[8 of 20 positions shown; findings below may reference images not displayed]

:
A retrospective scan was triggered in the descending thoracic aorta.
Axial non-contrast 3 mm slices were carried out through the heart.
The data set was analyzed on a dedicated work station and scored
using the Agatson method. Gantry rotation speed was 330 msecs and
collimation was .6 mm. 50mg of metoprolol and 0.8 mg of sl NTG was
given. The 3D data set was reconstructed in 5% intervals of the
60-95 % of the R-R cycle. Diastolic phases were analyzed on a
dedicated work station using MPR, MIP and VRT modes. The patient
received 125 cc of contrast.
FINDINGS: Aorta:  Normal size.  No calcifications.  No dissection.

Aortic Valve:  Trileaflet.  No calcifications.

Coronary Arteries:  Normal coronary origin.  Right dominance.

RCA is a small nondominant artery.  There is no plaque.

Left main is a large artery that gives rise to LAD and LCX arteries.

LAD is a large vessel that has no plaque.

LCX is a non dominant artery that gives rise to one OM1 branch. LCX
gives rise to the PDA. There is no plaque.

Other findings:

Normal pulmonary vein drainage into the left atrium.

Normal left atrial appendage without a thrombus.

Normal size of the pulmonary artery.
IMPRESSION: 1. Normal coronary calcium score of 0. Patient is low risk for
coronary events.

2. Normal coronary origin with left dominance.

3. No evidence of CAD.

4. CAD-RADS 0. Consider non-atherosclerotic causes of chest pain.

EXAM:
OVER-READ INTERPRETATION  CT CHEST

The following report is an over-read performed by radiologist Dr.
does not include interpretation of cardiac or coronary anatomy or
pathology. The coronary calcium score/coronary CTA interpretation by
the cardiologist is attached.
FINDINGS: The visualized portions of the lower lung fields show no suspicious
nodules, masses, or infiltrates. No pleural fluid seen.

The visualized portions of the mediastinum and chest wall are
unremarkable.
IMPRESSION: No significant non-cardiovascular abnormality in visualized portion
of the thorax.

*** End of Addendum ***
:
A retrospective scan was triggered in the descending thoracic aorta.
Axial non-contrast 3 mm slices were carried out through the heart.
The data set was analyzed on a dedicated work station and scored
using the Agatson method. Gantry rotation speed was 330 msecs and
collimation was .6 mm. 50mg of metoprolol and 0.8 mg of sl NTG was
given. The 3D data set was reconstructed in 5% intervals of the
60-95 % of the R-R cycle. Diastolic phases were analyzed on a
dedicated work station using MPR, MIP and VRT modes. The patient
received 125 cc of contrast.
FINDINGS: Aorta:  Normal size.  No calcifications.  No dissection.

Aortic Valve:  Trileaflet.  No calcifications.

Coronary Arteries:  Normal coronary origin.  Right dominance.

RCA is a small nondominant artery.  There is no plaque.

Left main is a large artery that gives rise to LAD and LCX arteries.

LAD is a large vessel that has no plaque.

LCX is a non dominant artery that gives rise to one OM1 branch. LCX
gives rise to the PDA. There is no plaque.

Other findings:

Normal pulmonary vein drainage into the left atrium.

Normal left atrial appendage without a thrombus.

Normal size of the pulmonary artery.
IMPRESSION: 1. Normal coronary calcium score of 0. Patient is low risk for
coronary events.

2. Normal coronary origin with left dominance.

3. No evidence of CAD.

4. CAD-RADS 0. Consider non-atherosclerotic causes of chest pain.

## 2021-06-18 MED ORDER — IOHEXOL 350 MG/ML SOLN
125.0000 mL | Freq: Once | INTRAVENOUS | Status: AC | PRN
  Administered 2021-06-18: 125 mL via INTRAVENOUS

## 2021-06-18 MED ORDER — NITROGLYCERIN 0.4 MG SL SUBL
0.8000 mg | SUBLINGUAL_TABLET | Freq: Once | SUBLINGUAL | Status: AC
  Administered 2021-06-18: 0.8 mg via SUBLINGUAL

## 2021-06-18 NOTE — Progress Notes (Signed)
Patient tolerated procedure well. Ambulate w/o difficulty. Denies light headedness or being dizzy. Sitting in chair drinking water provided. Encouraged to drink extra water today and reasoning explained. Verbalized understanding. All questions answered. ABC intact. No further needs. Discharge from procedure area w/o issues.   °

## 2021-06-20 ENCOUNTER — Encounter (INDEPENDENT_AMBULATORY_CARE_PROVIDER_SITE_OTHER): Payer: 59 | Admitting: Cardiology

## 2021-06-20 DIAGNOSIS — G4733 Obstructive sleep apnea (adult) (pediatric): Secondary | ICD-10-CM

## 2021-06-24 ENCOUNTER — Telehealth: Payer: Self-pay | Admitting: Physician Assistant

## 2021-06-24 NOTE — Telephone Encounter (Signed)
I spoke with the patient and advised her I do not see that her sleep study has resulted yet. I inquired what night she did the study and she advised this was done on Friday 06/19/21.  I have advised her that out sleep team will call her with these results once they are available, but I will forward them a message that she has called to follow up on these.   The patient voices understanding and is agreeable.

## 2021-06-24 NOTE — Telephone Encounter (Signed)
Her sleep study is not resulted yet.

## 2021-06-24 NOTE — Telephone Encounter (Signed)
Reviewed the patient's chart.  I do not see that her WatchPat results are available at this time.  Attempted to call the patient to find out what date she did her actual study. No answer- voice mail box is full.  Will attempt to call back at a later time.

## 2021-06-24 NOTE — Telephone Encounter (Signed)
Patient calling for watch pat results.

## 2021-07-06 ENCOUNTER — Ambulatory Visit: Payer: 59

## 2021-07-06 DIAGNOSIS — G4730 Sleep apnea, unspecified: Secondary | ICD-10-CM

## 2021-07-06 DIAGNOSIS — R0600 Dyspnea, unspecified: Secondary | ICD-10-CM

## 2021-07-06 DIAGNOSIS — R0609 Other forms of dyspnea: Secondary | ICD-10-CM

## 2021-07-06 NOTE — Procedures (Signed)
    Sleep Study Report  Patient Information Study Date: 06/20/21 Patient Name: Shannon Bryan Patient ID: [id number] Birth Date: [birth year] Age: 60 Gender: Female BMI: 61.3 (W=304 lb, H=4' 11'') Neck Circ.: 17 '' Referring Physician: Christell Faith, PA  TEST DESCRIPTION: Home sleep apnea testing was completed using the WatchPat, a Type 1 device, utilizing peripheral arterial tonometry (PAT), chest movement, actigraphy, pulse oximetry, pulse rate, body position and snore. AHI was calculated with apnea and hypopnea using valid sleep time as the denominator. RDI includes apneas, hypopneas, and RERAs. The data acquired and the scoring of sleep and all associated events were performed in accordance with the recommended standards and specifications as outlined in the AASM Manual for the Scoring of Sleep and Associated Events 2.2.0 (2015).  FINDINGS: 1. Mild Obstructive Sleep Apnea with AHI 6.8/hr. 2. No Central Sleep Apnea with pAHIc 0/hr. 3. Oxygen desaturations as low as 82%. 4. Moderate snoring was present. O2 sats were < 88% for 0.6 min. 5. Total sleep time was 7 hrs and 1 min. 6. 30.8% of total sleep time was spent in REM sleep. 7. Normal sleep onset latency at 17 min. 8. Shortened REM sleep onset latency at 81mn. 9. Total awakenings were 14 .  DIAGNOSIS: Mild Obstructive Sleep Apnea (G47.33)  RECOMMENDATIONS: 1. Clinical correlation of these findings is necessary. The decision to treat obstructive sleep apnea (OSA) is usually based on the presence of apnea symptoms or the presence of associated medical conditions such as Hypertension, Congestive Heart Failure, Atrial Fibrillation or Obesity. The most common symptoms of OSA are snoring, gasping for breath while sleeping, daytime sleepiness and fatigue.  2. Initiating apnea therapy is recommended given the presence of symptoms and/or associated conditions. Recommend proceeding with one of the following:   a. Auto-CPAP therapy  with a pressure range of 5-20cm H2O.   b. An oral appliance (OA) that can be obtained from certain dentists with expertise in sleep medicine. These are primarily of use in non-obese patients with mild and moderate disease.   c. An ENT consultation which may be useful to look for specific causes of obstruction and possible treatment options.   d. If patient is intolerant to PAP therapy, consider referral to ENT for evaluation for hypoglossal nerve stimulator.  3. Close follow-up is necessary to ensure success with CPAP or oral appliance therapy for maximum benefit .  4. A follow-up oximetry study on CPAP is recommended to assess the adequacy of therapy and determine the need for supplemental oxygen or the potential need for Bi-level therapy. An arterial blood gas to determine the adequacy of baseline ventilation and oxygenation should also be considered.  5. Healthy sleep recommendations include: adequate nightly sleep (normal 7-9 hrs/night), avoidance of caffeine after noon and alcohol near bedtime, and maintaining a sleep environment that is cool, dark and quiet.  6. Weight loss for overweight patients is recommended. Even modest amounts of weight loss can significantly improve the severity of sleep apnea.  7. Snoring recommendations include: weight loss where appropriate, side sleeping, and avoidance of alcohol before bed.  8. Operation of motor vehicle should be avoided when sleepy.  Signature: Electronically Signed: 07/06/21 TFransico Him MD; [facility] [place] American Board of Sleep Medicine Report prepared by: TFransico Him

## 2021-07-08 ENCOUNTER — Telehealth: Payer: Self-pay | Admitting: *Deleted

## 2021-07-08 DIAGNOSIS — G4733 Obstructive sleep apnea (adult) (pediatric): Secondary | ICD-10-CM

## 2021-07-08 NOTE — Addendum Note (Signed)
Addended by: Freada Bergeron on: 07/08/2021 12:27 PM   Modules accepted: Orders

## 2021-07-08 NOTE — Telephone Encounter (Signed)
-----   Message from Sueanne Margarita, MD sent at 07/06/2021  7:22 PM EDT ----- Please let patient know that they have sleep apnea and recommend treating with CPAP.  Please order an auto CPAP from 4-15cm H2O with heated humidity and mask of choice.  Order overnight pulse ox on CPAP.  Followup with me in 6 weeks.

## 2021-07-08 NOTE — Telephone Encounter (Addendum)
The patient has been notified of the result and verbalized understanding.  All questions (if any) were answered. Shannon Bryan, [name] 07/08/2021 12:21 PM    Upon patient request DME selection is [facility] Patient understands he will be contacted by [facility] to set up his cpap. Patient understands to call if [name] does not contact him with new setup in a timely manner. Patient understands they will be called once confirmation has been received from [facility] that they have received their new machine to schedule 10 week follow up appointment. [facility] notified of new cpap order  Please add to airview Patient was grateful for the call and thanked me

## 2021-08-07 ENCOUNTER — Telehealth: Payer: Self-pay | Admitting: Cardiology

## 2021-08-07 NOTE — Telephone Encounter (Signed)
Terry from [facility] calling to inform they received an order of an ONO on CPAP, but the patient got her CPAP from another company and is not a patient of theirs. She states they have to request the company that gave her the CPAP do the ONO. She says she does not need a call back.

## 2021-08-07 NOTE — Progress Notes (Signed)
Cardiology Office Note    Date:  08/10/2021   ID:  Shannon Bryan, Shannon Bryan 10, 1962, MRN [id number]  PCP:  Mikey College, NP (Inactive)  Cardiologist:  Kathlyn Sacramento, MD  Electrophysiologist:  None   Chief Complaint: Follow-up  History of Present Illness:   Shannon Bryan is a 60 y.o. female with history of HTN, mild HLD, COVID, tobacco use, recently diagnosed sleep apnea, and obesity who presents for follow up of coronary CTA.   She was seen in 12/2018 with worsening exertional dyspnea and chest pain in the setting of uncontrolled hypertension with BP as high as 409 mmHg systolic. She was on HCTZ at that time. Coreg was added. She underwent Lexiscan Myoview in 04/2019 which showed a fixed anterior wall defect likely secondary to breast attenuation artifact. EF estimation was inaccurate. In this setting, she underwent echo in 04/2019, which showed an EF of 55-60% with no significant valvular abnormalities or pulmonary hypertension.  She was under increased stress in 05/2019 with her brother having passed away with COVID-19 and having had multiple family members sick with the virus. She was seen in the office in 10/2019 for follow up and had been out of her amlodipine. She noted pedal swelling that was worse throughout the day and improved with elevation and in the mornings. She also noted some right-sided chest pain and back pain with increased activity and stress. BP was poorly controlled at 150/90. She was started on spironolactone and amlodipine was resumed.  In follow-up in 11/2019 she was doing very well and noted since initiation of spironolactone her lower extremity swelling had resolved.  Her weight was down 10 pounds compared to her last visit.   She was seen in the office on 05/05/2021 after having run out of her blood pressure medications several months prior.  Her BP had been running extremely high with associated chest discomfort, dyspnea, headaches, and blurred vision.  BP was  elevated at 168/90.  These symptoms were similar to her prior presentation in the setting of uncontrolled hypertension.  She reported having had a COVID infection for the third time.  Her weight was stable.  Her antihypertensive medications were resumed at that time.  Given her recent COVID infection and exertional symptoms, she underwent echo on 06/02/2021 which showed an EF of 55 to 60%, no regional wall motion abnormalities, normal RV systolic function and ventricular cavity size, and trivial mitral regurgitation.  She was last seen in the office in 06/2021 and were noted significant improvement in her home BP readings following resumption of antihypertensive medications.  She did continue to note exertional dyspnea and chest tightness that was largely unchanged.  Given the symptoms, she underwent coronary CTA on 06/18/2021 which showed a calcium score of 0 with no evidence of obstructive CAD.  She was prescribed Chantix in an effort to stop smoking.  Given sleep disordered breathing, she underwent sleep study which confirmed sleep apnea.  She comes in doing well from a cardiac perspective.  No chest pain, dyspnea, dizziness, presyncope, or syncope.  No lower extremity swelling, abdominal distention, orthopnea, PND, or early satiety.  She has missed her past several days of hypertensive medication due to recent road trip.  BP at home is typically in the 811B to 147W systolic following medications.  She is awaiting her CPAP.  She does note issues with dental pain.  She continues to be under increased stress with the help of her sister.  Otherwise, she is without complaints today.  Labs independently reviewed: 06/2021 - BUN 12, serum creatinine 0.87, potassium 4.0 05/2021 - Hgb 15.2, PLT 334 07/2020 - albumin 4.1, AST/ALT normal 12/2018 - A1c 6.3, magnesium 2.2, TSH normal 03/2017 - TC 176, TG 99, HDL 42, LDL 114  Past Medical History:  Diagnosis Date   Asthma    Blood clot in vein    Chronic kidney disease     Hyperlipidemia    Hypertension    Kidney stone     Past Surgical History:  Procedure Laterality Date   ABDOMINAL HYSTERECTOMY     ABLATION     BREAST RECONSTRUCTION WITH MASTOPLASTY Bilateral 03/11/2020   Procedure: BREAST RECONSTRUCTION WITH MASTOPLASTY;  Surgeon: Youlanda Roys, MD;  Location: [facility];  Service: Plastics;  Laterality: Bilateral;   CHOLECYSTECTOMY     ROTATOR CUFF REPAIR Right    TUBAL LIGATION      Current Medications: Current Meds  Medication Sig   acetaminophen (TYLENOL) 500 MG tablet Take 500 mg by mouth every 6 (six) hours as needed.   amLODipine (NORVASC) 5 MG tablet Take 1 tablet (5 mg total) by mouth daily.   aspirin EC 81 MG tablet Take 81 mg by mouth daily.   carvedilol (COREG) 6.25 MG tablet Take 1 tablet (6.25 mg total) by mouth 2 (two) times daily.   cetirizine (ZYRTEC) 10 MG tablet Take 10 mg by mouth daily.   hydrochlorothiazide (HYDRODIURIL) 25 MG tablet Take 1 tablet (25 mg total) by mouth daily.   ibuprofen (ADVIL) 800 MG tablet Take 1 tablet (800 mg total) by mouth 3 (three) times daily as needed.   Multiple Vitamins-Minerals (ONE-A-DAY 50 PLUS PO) Take 1 tablet by mouth daily.   potassium chloride SA (KLOR-CON M20) 20 MEQ tablet Take 1 tablet (20 mEq total) by mouth daily.   spironolactone (ALDACTONE) 25 MG tablet Take 1 tablet (25 mg total) by mouth daily.   traZODone (DESYREL) 50 MG tablet Take 0.5-1 tablets (25-50 mg total) by mouth at bedtime as needed for sleep.   varenicline (CHANTIX STARTING MONTH PAK) 0.5 MG X 11 & 1 MG X 42 tablet Take one 0.5 mg tablet by mouth once daily for 3 days, then increase to one 0.5 mg tablet twice daily for 4 days, then increase to one 1 mg tablet twice daily.    Allergies:   Bactrim [sulfamethoxazole-trimethoprim], Penicillins, Sulfa antibiotics, and Cleocin [clindamycin hcl]   Social History   Socioeconomic History   Marital status: Single    Spouse name: Not on file    Number of children: Not on file   Years of education: Not on file   Highest education level: Not on file  Occupational History   Not on file  Tobacco Use   Smoking status: Former    Packs/day: 0.25    Years: 30.00    Pack years: 7.50    Types: Cigarettes    Quit date: 02/03/2020    Years since quitting: 1.5   Smokeless tobacco: Former   Tobacco comments:    1/4 - 1/2 ppd had quit for 1 year in 2010  Vaping Use   Vaping Use: Some days   Devices: Vaping  Substance and Sexual Activity   Alcohol use: Yes    Comment: occasional   Drug use: No   Sexual activity: Never  Other Topics Concern   Not on file  Social History Narrative   Not on file   Social Determinants of Health   Financial Resource Strain: Not  on file  Food Insecurity: Not on file  Transportation Needs: Not on file  Physical Activity: Not on file  Stress: Not on file  Social Connections: Not on file     Family History:  The patient's family history includes Cancer in her mother; Depression in her mother; Diabetes in her mother; Emphysema in her father; Heart disease in her mother; Stroke in her mother.  ROS:   Review of Systems  Constitutional:  Negative for chills, diaphoresis, fever, malaise/fatigue and weight loss.  HENT:  Negative for congestion.        Dental pain  Eyes:  Negative for discharge and redness.  Respiratory:  Negative for cough, sputum production, shortness of breath and wheezing.   Cardiovascular:  Negative for chest pain, palpitations, orthopnea, claudication, leg swelling and PND.  Gastrointestinal:  Negative for abdominal pain, heartburn, nausea and vomiting.  Musculoskeletal:  Negative for falls and myalgias.  Skin:  Negative for rash.  Neurological:  Positive for headaches. Negative for dizziness, tingling, tremors, sensory change, speech change, focal weakness, loss of consciousness and weakness.  Endo/Heme/Allergies:  Does not bruise/bleed easily.  Psychiatric/Behavioral:  Negative  for substance abuse. The patient is not nervous/anxious.   All other systems reviewed and are negative.   EKGs/Labs/Other Studies Reviewed:    Studies reviewed were summarized above. The additional studies were reviewed today:  Coronary CTA 06/18/2021: FINDINGS: Aorta:  Normal size.  No calcifications.  No dissection.   Aortic Valve:  Trileaflet.  No calcifications.   Coronary Arteries:  Normal coronary origin.  Right dominance.   RCA is a small nondominant artery.  There is no plaque.   Left main is a large artery that gives rise to LAD and LCX arteries.   LAD is a large vessel that has no plaque.   LCX is a non dominant artery that gives rise to one OM1 branch. LCX gives rise to the PDA. There is no plaque.   Other findings:   Normal pulmonary vein drainage into the left atrium.   Normal left atrial appendage without a thrombus.   Normal size of the pulmonary artery.   IMPRESSION: 1. Normal coronary calcium score of 0. Patient is low risk for coronary events. 2. Normal coronary origin with left dominance. 3. No evidence of CAD. 4. CAD-RADS 0. Consider non-atherosclerotic causes of chest pain. __________  2D echo 06/02/2021: 1. Left ventricular ejection fraction, by estimation, is 55 to 60%. The  left ventricle has normal function. The left ventricle has no regional  wall motion abnormalities. Left ventricular diastolic parameters are  indeterminate. The average left  ventricular global longitudinal strain is -18.6 %. The global longitudinal  strain is normal.   2. Right ventricular systolic function is normal. The right ventricular  size is normal.   3. The mitral valve is abnormal. Trivial mitral valve regurgitation. No  evidence of mitral stenosis.   4. The aortic valve is tricuspid. Aortic valve regurgitation is mild. No  aortic stenosis is present. __________   2D echo 04/20/2019: 1. The left ventricle has normal systolic function, with an ejection   fraction of 55-60%. The cavity size was normal. Left ventricular diastolic  parameters were normal. No evidence of left ventricular regional wall  motion abnormalities.   2. The right ventricle has normal systolic function. The cavity was  normal. There is no increase in right ventricular wall thickness. Right  ventricular systolic pressure is normal.   3. The mitral valve is grossly normal. There is mild  mitral annular  calcification present.   4. The aortic valve was not well visualized.   5. The aortic root and ascending aorta are normal in size and structure.   6. The interatrial septum was not well visualized. __________   [facility] Adam MPI 04/16/2019: Abnormal, probably low risk pharmacologic myocardial perfusion stress test. There is a moderate in size, mild in severity, fixed mid anteroseptal, apical anterior, apical septal, and apical defect that most likely represents artifact (attenuation) but cannot exclude scar. There is no significant ischemia. Left ventricular systolic function is mild to moderately reduced (LVEF 50% by Siemens calculation, 38% by QGS) with subtle apical/apical septal hypokinesis. No significant coronary artery calcification is seen on the attenuation correction CT. Aortic atherosclerosis is noted in the aortic arch. The sensitivity and specificity of this study are degraded by the patient's body habitus.    EKG:  EKG is not ordered today.   Recent Labs: 05/11/2021: Hemoglobin 15.2; Platelets 334 06/11/2021: BUN 12; Creatinine, Ser 0.87; Potassium 4.0; Sodium 141  Recent Lipid Panel    Component Value Date/Time   CHOL 176 03/29/2017 0833   TRIG 99 03/29/2017 0833   HDL 42 (L) 03/29/2017 0833   CHOLHDL 4.2 03/29/2017 0833   VLDL 20 03/29/2017 0833   LDLCALC 114 (H) 03/29/2017 0833    PHYSICAL EXAM:    VS:  BP (!) 142/76   Pulse 86   Ht 5\' 9"  (1.753 m)   Wt (!) 308 lb (139.7 kg)   SpO2 98%   BMI 45.48 kg/m   BMI: Body mass index is 45.48  kg/m.  Physical Exam Vitals reviewed.  Constitutional:      Appearance: She is well-developed.  HENT:     Head: Normocephalic and atraumatic.  Eyes:     General:        Right eye: No discharge.        Left eye: No discharge.  Neck:     Vascular: No JVD.  Cardiovascular:     Rate and Rhythm: Normal rate and regular rhythm.     Pulses:          Posterior tibial pulses are 2+ on the right side and 2+ on the left side.     Heart sounds: Normal heart sounds, S1 normal and S2 normal. Heart sounds not distant. No midsystolic click and no opening snap. No murmur heard.   No friction rub.  Pulmonary:     Effort: Pulmonary effort is normal. No respiratory distress.     Breath sounds: Normal breath sounds. No decreased breath sounds, wheezing or rales.  Chest:     Chest wall: No tenderness.  Abdominal:     General: There is no distension.     Palpations: Abdomen is soft.     Tenderness: There is no abdominal tenderness.  Musculoskeletal:     Cervical back: Normal range of motion.     Right lower leg: No edema.     Left lower leg: No edema.  Skin:    General: Skin is warm and dry.     Nails: There is no clubbing.  Neurological:     Mental Status: She is alert and oriented to person, place, and time.  Psychiatric:        Speech: Speech normal.        Behavior: Behavior normal.        Thought Content: Thought content normal.        Judgment: Judgment normal.    Wt Readings from  Last 3 Encounters:  08/10/21 (!) 308 lb (139.7 kg)  06/11/21 (!) 304 lb 6 oz (138.1 kg)  05/05/21 (!) 304 lb (137.9 kg)     ASSESSMENT & PLAN:   HTN: Blood pressure is mildly elevated in the office today at 142/76.  She has not taken her antihypertensive medications in the past 3 days secondary to a recent road trip.  She will take these medications upon going home today.  BP at home following medications is typically in the 527P to 824M systolic.  She remains on amlodipine, carvedilol, HCTZ, metoprolol  tartrate, and spironolactone.  Hopefully, following adequate treatment of OSA her BP will continue to improve.  Low-sodium diet recommended.  Exertional dyspnea: Symptoms overall stable to improved.  Cardiac work-up has been unrevealing including echo and coronary CTA.  Likely some degree of physical deconditioning in the context of morbid obesity.  HLD: Not currently on a statin with LDL of 114 on last check.  Calcium score 0 on coronary CTA.  Heart healthy diet recommended.  Tobacco use: Complete cessation is recommended.  Previously prescribed Chantix.  Morbid obesity: Weight loss is encouraged through healthy diet and regular exercise.  Sleep apnea: Awaiting CPAP.  Disposition: F/u with Dr. Fletcher Anon or an APP in 6 months, sooner if needed.   Medication Adjustments/Labs and Tests Ordered: Current medicines are reviewed at length with the patient today.  Concerns regarding medicines are outlined above. Medication changes, Labs and Tests ordered today are summarized above and listed in the Patient Instructions accessible in Encounters.   Signed, Christell Faith, PA-C 08/10/2021 12:02 PM     [facility] [address] [place], [place] [address] [phone]

## 2021-08-10 ENCOUNTER — Ambulatory Visit (INDEPENDENT_AMBULATORY_CARE_PROVIDER_SITE_OTHER): Payer: 59 | Admitting: Physician Assistant

## 2021-08-10 ENCOUNTER — Encounter: Payer: Self-pay | Admitting: Physician Assistant

## 2021-08-10 ENCOUNTER — Other Ambulatory Visit: Payer: Self-pay

## 2021-08-10 VITALS — BP 142/76 | HR 86 | Ht 69.0 in | Wt 308.0 lb

## 2021-08-10 DIAGNOSIS — R0602 Shortness of breath: Secondary | ICD-10-CM

## 2021-08-10 DIAGNOSIS — E782 Mixed hyperlipidemia: Secondary | ICD-10-CM | POA: Diagnosis not present

## 2021-08-10 DIAGNOSIS — I10 Essential (primary) hypertension: Secondary | ICD-10-CM

## 2021-08-10 DIAGNOSIS — Z720 Tobacco use: Secondary | ICD-10-CM | POA: Diagnosis not present

## 2021-08-10 DIAGNOSIS — G4733 Obstructive sleep apnea (adult) (pediatric): Secondary | ICD-10-CM

## 2021-08-10 NOTE — Patient Instructions (Signed)
Medication Instructions:  Your physician recommends that you continue on your current medications as directed. Please refer to the Current Medication list given to you today.  *If you need a refill on your cardiac medications before your next appointment, please call your pharmacy*   Lab Work: None ordered If you have labs (blood work) drawn today and your tests are completely normal, you will receive your results only by: MyChart Message (if you have MyChart) OR A paper copy in the mail If you have any lab test that is abnormal or we need to change your treatment, we will call you to review the results.   Testing/Procedures: None ordered   Follow-Up: At [facility], you and your health needs are our priority.  As part of our continuing mission to provide you with exceptional heart care, we have created designated Provider Care Teams.  These Care Teams include your primary Cardiologist (physician) and Advanced Practice Providers (APPs -  Physician Assistants and Nurse Practitioners) who all work together to provide you with the care you need, when you need it.  We recommend signing up for the patient portal called "MyChart".  Sign up information is provided on this After Visit Summary.  MyChart is used to connect with patients for Virtual Visits (Telemedicine).  Patients are able to view lab/test results, encounter notes, upcoming appointments, etc.  Non-urgent messages can be sent to your provider as well.   To learn more about what you can do with MyChart, go to [email or web address].    Your next appointment:   Your physician wants you to follow-up in: 6 months You will receive a reminder letter in the mail two months in advance. If you don't receive a letter, please call our office to schedule the follow-up appointment.   The format for your next appointment:   In Person  Provider:   You may see Muhammad Arida, MD or one of the following Advanced Practice Providers on your  designated Care Team:   Christopher Berge, NP Ryan Dunn, PA-C Jacquelyn Visser, PA-C Cadence Furth, PA-C   Other Instructions N/A  

## 2021-08-11 ENCOUNTER — Ambulatory Visit: Payer: PRIVATE HEALTH INSURANCE | Admitting: Physician Assistant

## 2021-08-11 NOTE — Telephone Encounter (Signed)
Message noted and redirected.

## 2021-08-22 ENCOUNTER — Emergency Department: Payer: 59

## 2021-08-22 ENCOUNTER — Other Ambulatory Visit: Payer: Self-pay

## 2021-08-22 ENCOUNTER — Emergency Department
Admission: EM | Admit: 2021-08-22 | Discharge: 2021-08-22 | Disposition: A | Discharge status: Home / Self Care | Payer: 59 | Attending: Emergency Medicine | Admitting: Emergency Medicine

## 2021-08-22 DIAGNOSIS — J329 Chronic sinusitis, unspecified: Secondary | ICD-10-CM | POA: Diagnosis present

## 2021-08-22 DIAGNOSIS — H6981 Other specified disorders of Eustachian tube, right ear: Secondary | ICD-10-CM

## 2021-08-22 DIAGNOSIS — H6991 Unspecified Eustachian tube disorder, right ear: Secondary | ICD-10-CM | POA: Diagnosis not present

## 2021-08-22 DIAGNOSIS — Z7982 Long term (current) use of aspirin: Secondary | ICD-10-CM | POA: Diagnosis not present

## 2021-08-22 DIAGNOSIS — M542 Cervicalgia: Secondary | ICD-10-CM | POA: Insufficient documentation

## 2021-08-22 DIAGNOSIS — Z87891 Personal history of nicotine dependence: Secondary | ICD-10-CM | POA: Insufficient documentation

## 2021-08-22 DIAGNOSIS — J45909 Unspecified asthma, uncomplicated: Secondary | ICD-10-CM | POA: Insufficient documentation

## 2021-08-22 DIAGNOSIS — I129 Hypertensive chronic kidney disease with stage 1 through stage 4 chronic kidney disease, or unspecified chronic kidney disease: Secondary | ICD-10-CM | POA: Diagnosis not present

## 2021-08-22 DIAGNOSIS — N189 Chronic kidney disease, unspecified: Secondary | ICD-10-CM | POA: Insufficient documentation

## 2021-08-22 LAB — COMPREHENSIVE METABOLIC PANEL
ALT: 25 U/L (ref 0–44)
AST: 22 U/L (ref 15–41)
Albumin: 3.8 g/dL (ref 3.5–5.0)
Alkaline Phosphatase: 76 U/L (ref 38–126)
Anion gap: 8 (ref 5–15)
BUN: 9 mg/dL (ref 6–20)
CO2: 28 mmol/L (ref 22–32)
Calcium: 9 mg/dL (ref 8.9–10.3)
Chloride: 102 mmol/L (ref 98–111)
Creatinine, Ser: 0.92 mg/dL (ref 0.44–1.00)
GFR, Estimated: >60 mL/min (ref >60)
Glucose, Bld: 159 mg/dL — ABNORMAL HIGH (ref 70–99)
Potassium: 3.8 mmol/L (ref 3.5–5.1)
Sodium: 138 mmol/L (ref 135–145)
Total Bilirubin: 0.6 mg/dL (ref 0.3–1.2)
Total Protein: 7.6 g/dL (ref 6.5–8.1)

## 2021-08-22 LAB — CBC WITH DIFFERENTIAL/PLATELET
Abs Immature Granulocytes: 0.03 10*3/uL (ref 0.00–0.07)
Basophils Absolute: 0 10*3/uL (ref 0.0–0.1)
Basophils Relative: 1 %
Eosinophils Absolute: 0.1 10*3/uL (ref 0.0–0.5)
Eosinophils Relative: 1 %
HCT: 41.9 % (ref 36.0–46.0)
Hemoglobin: 13.9 g/dL (ref 12.0–15.0)
Immature Granulocytes: 0 %
Lymphocytes Relative: 36 %
Lymphs Abs: 3 10*3/uL (ref 0.7–4.0)
MCH: 28 pg (ref 26.0–34.0)
MCHC: 33.2 g/dL (ref 30.0–36.0)
MCV: 84.3 fL (ref 80.0–100.0)
Monocytes Absolute: 0.5 10*3/uL (ref 0.1–1.0)
Monocytes Relative: 6 %
Neutro Abs: 4.6 10*3/uL (ref 1.7–7.7)
Neutrophils Relative %: 56 %
Platelets: 265 10*3/uL (ref 150–400)
RBC: 4.97 MIL/uL (ref 3.87–5.11)
RDW: 15.5 % (ref 11.5–15.5)
WBC: 8.3 10*3/uL (ref 4.0–10.5)
nRBC: 0 % (ref 0.0–0.2)

## 2021-08-22 LAB — BRAIN NATRIURETIC PEPTIDE: B Natriuretic Peptide: 20.9 pg/mL (ref 0.0–100.0)

## 2021-08-22 LAB — TROPONIN I (HIGH SENSITIVITY): Troponin I (High Sensitivity): 3 ng/L (ref <18)

## 2021-08-22 IMAGING — CR DG CHEST 2V
1 series · 2 of 2 positions shown · non-contrast
Comparison: Cardiac CT dated 06/18/2021.

CLINICAL DATA: Right upper chest pain. Low-grade fever for the past
2 weeks.

EXAM:
CHEST - 2 VIEW

[Series 1: dg chest 2 view · 0.14mm/px · 2 of 2 slices shown]
[im 1/2]
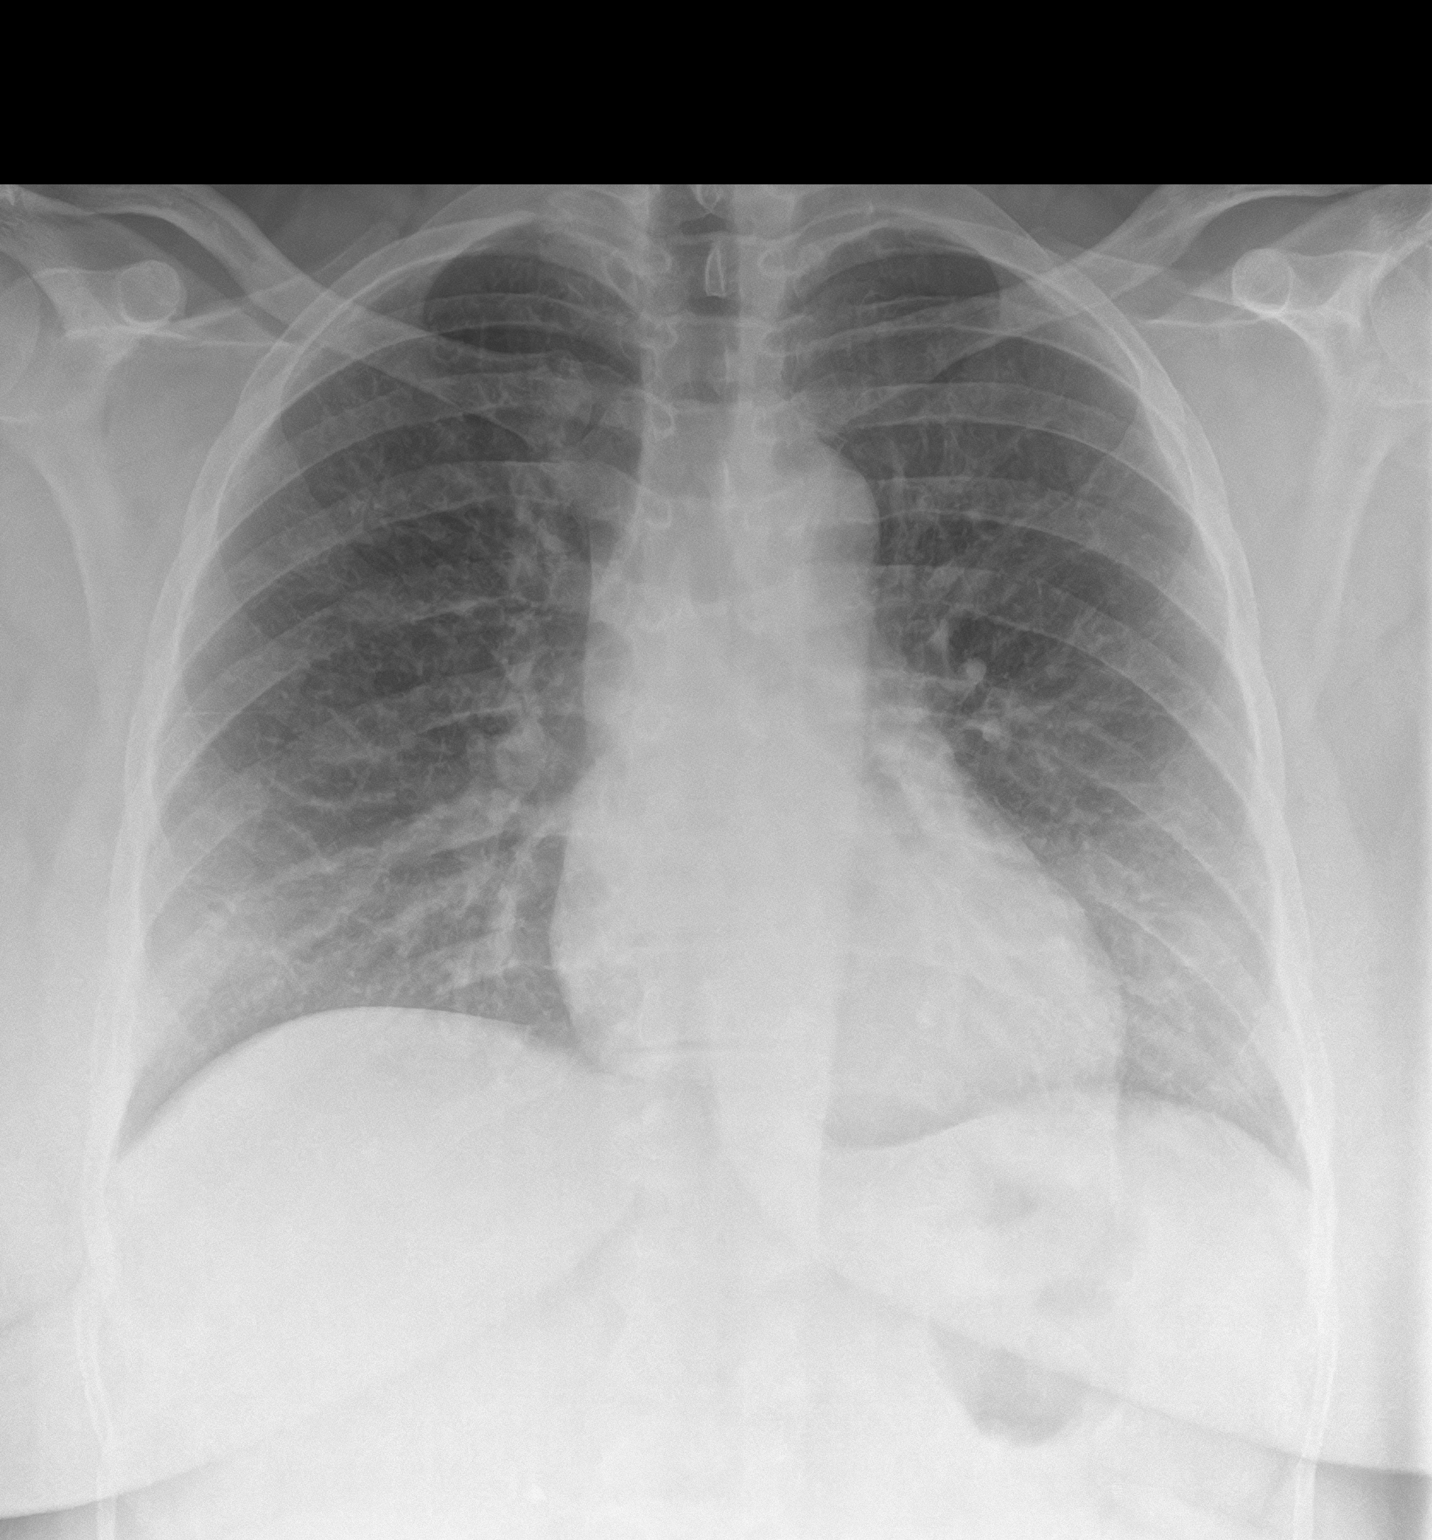
[im 2/2]
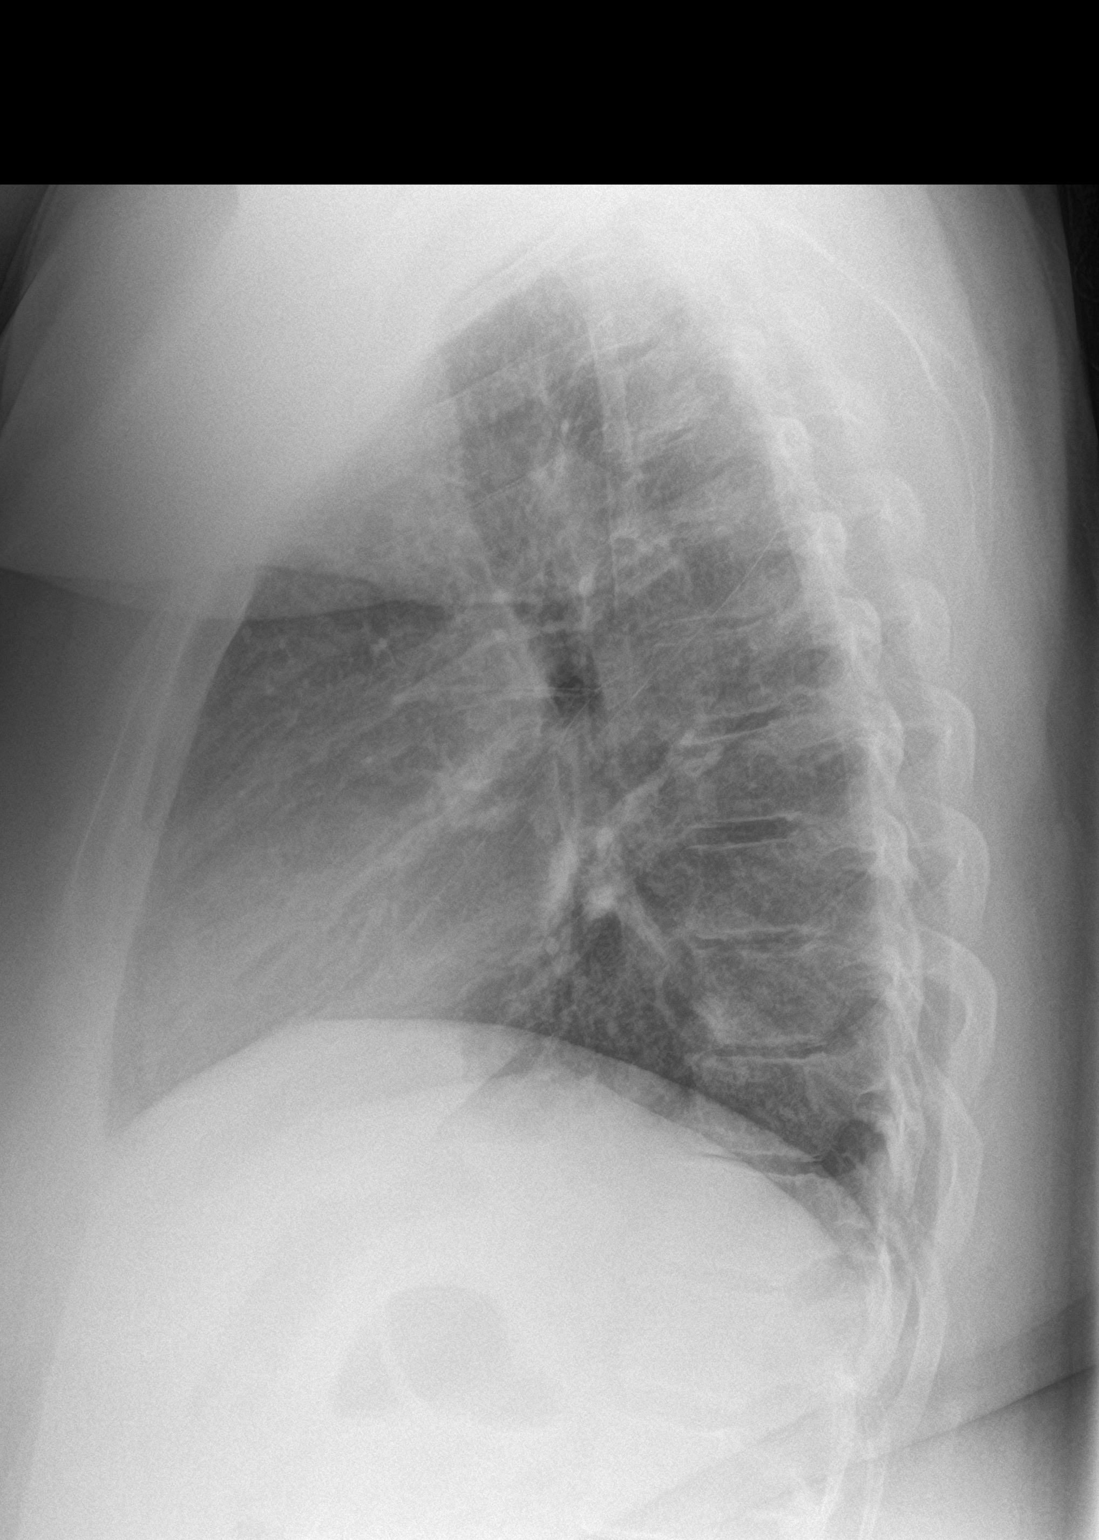

[2 of 2 positions shown; findings below may reference images not displayed]

FINDINGS: The heart size and mediastinal contours are within normal limits.
Minimal diffuse bilateral interstitial opacities are noted. There is
no pleural effusion or pneumothorax. The visualized skeletal
structures are unremarkable.
IMPRESSION: Minimal diffuse bilateral interstitial opacities may represent mild
pulmonary edema.

## 2021-08-22 IMAGING — CT CT NECK W/ CM
3 of 4 series · 13 of 33 positions shown, 16 images · IV contrast (omnipaque)
Comparison: None.

CLINICAL DATA: Neck abscess, deep tissue. Sinus and ear pain
extending to the neck. Fever.

EXAM:
CT NECK WITH CONTRAST
TECHNIQUE: Multidetector CT imaging of the neck was performed using the
standard protocol following the bolus administration of intravenous
contrast.
CONTRAST:  80mL OMNIPAQUE IOHEXOL 300 MG/ML  SOLN

[Series 5: sag neck · sagittal · 0.46mm/px · 5 of 95 slices shown, 6 images]
[im 32/95  bone]
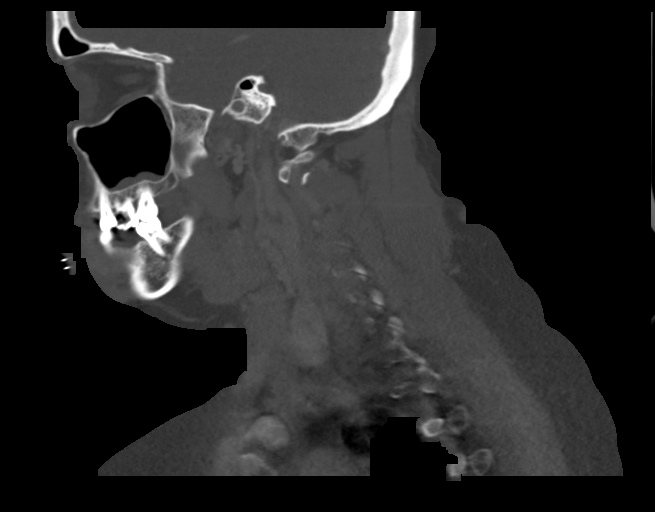
[im 40/95  bone]
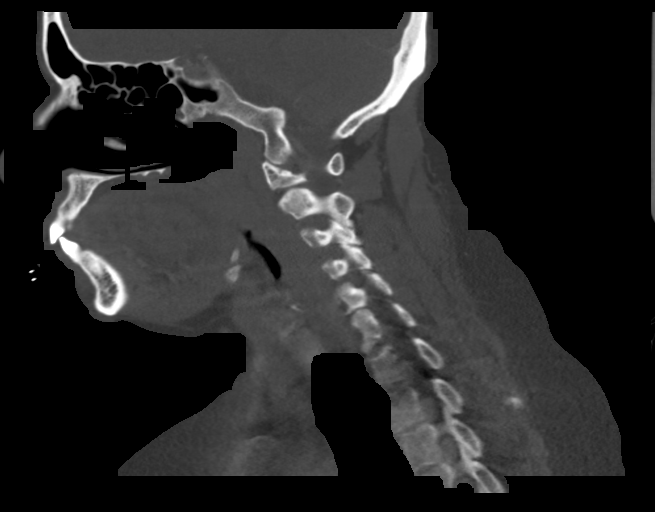
[im 48/95  soft-tissue]
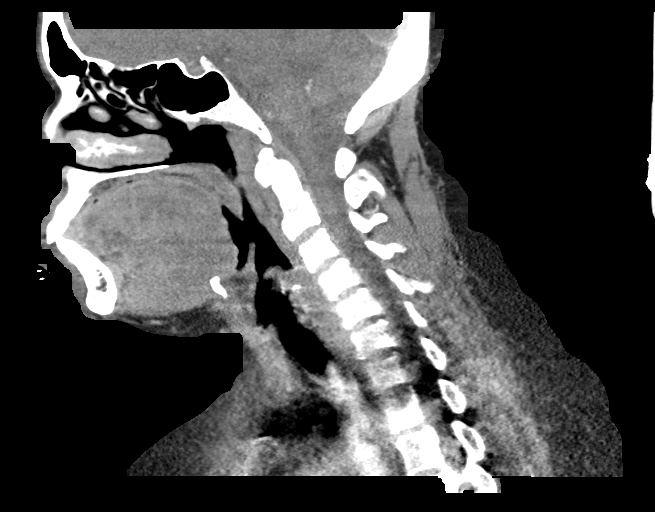
[im 48/95  bone]
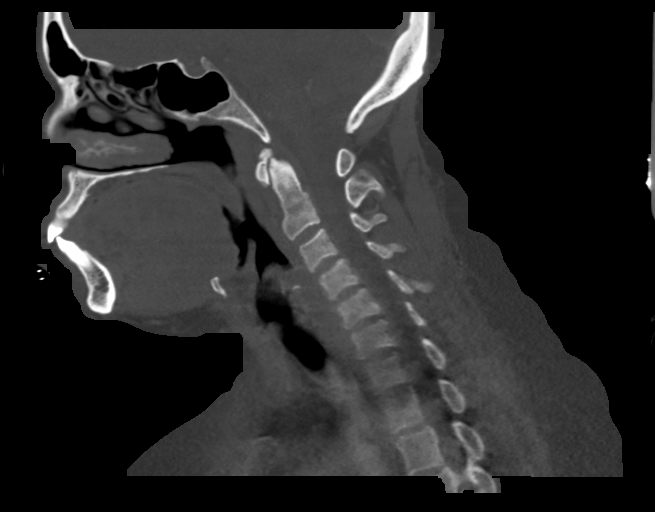
[im 55/95  bone]
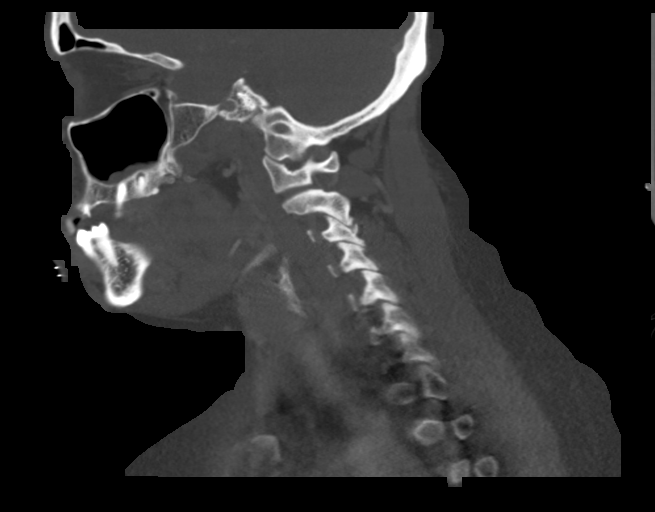
[im 63/95  bone]
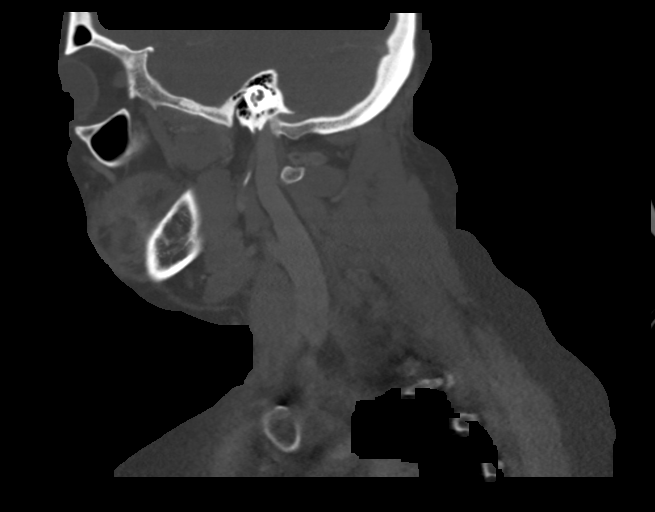

[Series 6: cor neck · coronal · 0.38mm/px · 3 of 152 slices shown]
[im 31/152  bone]
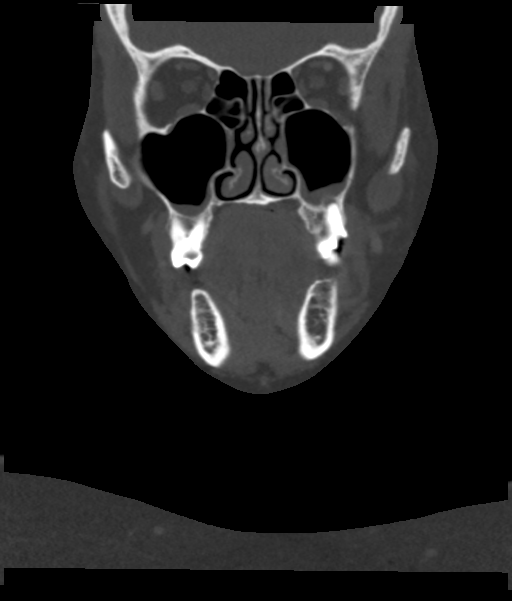
[im 61/152  bone]
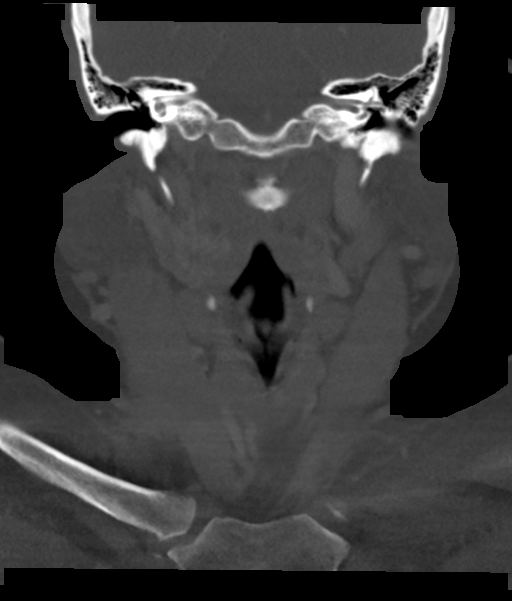
[im 91/152  bone]
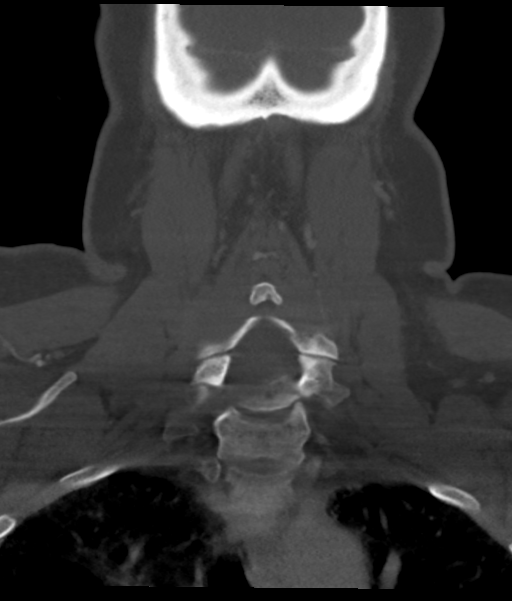

[Series 7: orthogonal ax · axial · 0.42mm/px · z∈[-336,-142]mm · 5 of 150 slices shown, 7 images]
[im 22/150  soft-tissue]
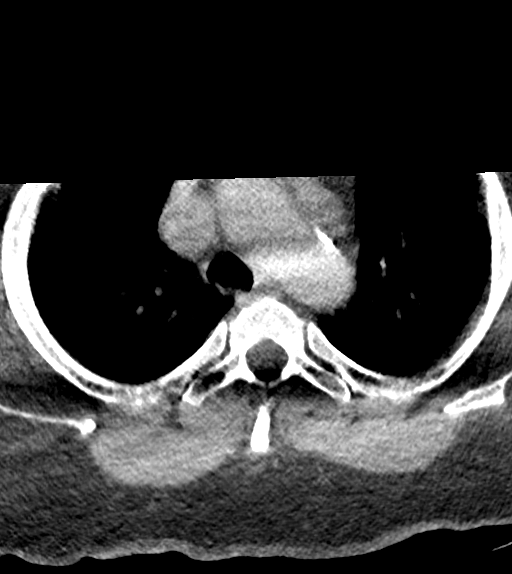
[im 22/150  bone]
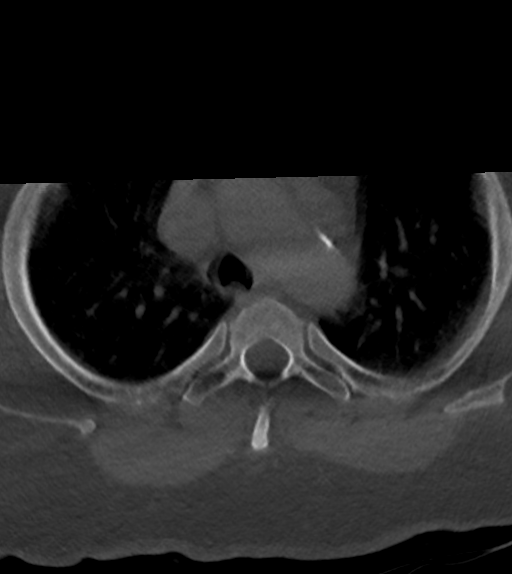
[im 43/150  bone]
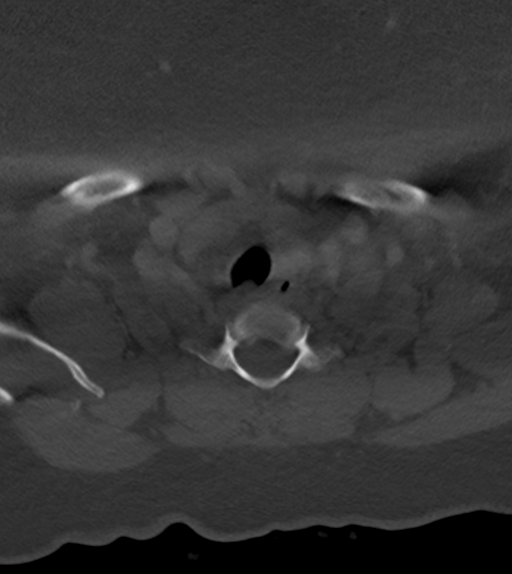
[im 86/150  bone]
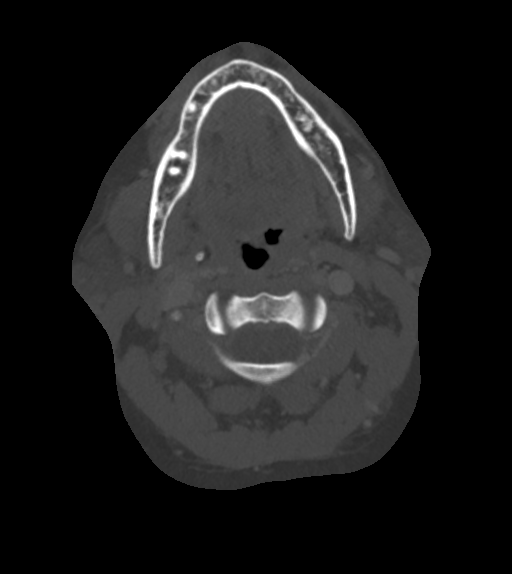
[im 107/150  bone]
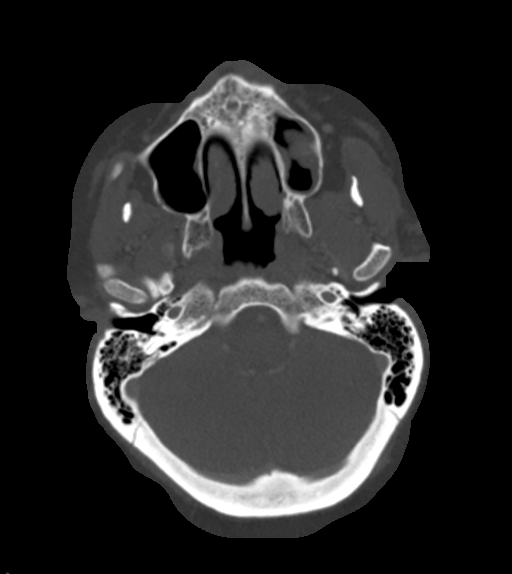
[im 128/150  soft-tissue]
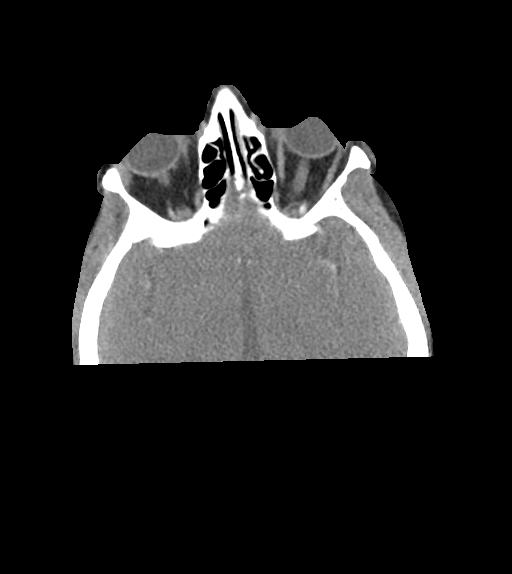
[im 128/150  bone]
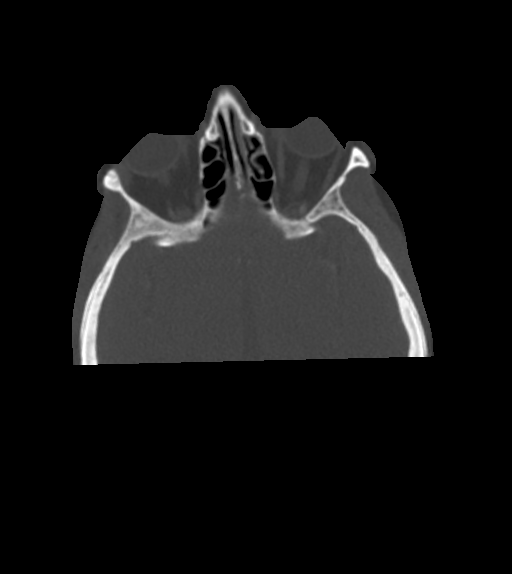

[13 of 33 positions shown; findings below may reference images not displayed]

FINDINGS: Pharynx and larynx: No mucosal or submucosal lesion. No evidence of
tonsillitis, mass or other inflammatory change.

Salivary glands: Parotid and submandibular glands are normal.

Thyroid: Normal

Lymph nodes: No enlarged or low-density lymph nodes on either side
of the neck.

Vascular: No abnormal vascular finding.

Limited intracranial: Normal

Visualized orbits: Normal

Mastoids and visualized paranasal sinuses: Paranasal sinuses are
clear except for mild mucosal thickening of the maxillary sinus
floors, likely subclinical. Mastoid air cells are clear. No fluid
in either middle ear or attic.

Skeleton: Dental and periodontal disease. Multiple caries. No
evidence of dental abscess.

Upper chest: Negative

Other: None
IMPRESSION: No evidence of inflammatory disease in the neck. No sign of mass
lesion.

Dental and periodontal disease which could be symptomatic.

## 2021-08-22 MED ORDER — AZITHROMYCIN 250 MG PO TABS: ORAL_TABLET | ORAL | 0 refills | Status: DC

## 2021-08-22 MED ORDER — IOHEXOL 300 MG/ML  SOLN
80.0000 mL | Freq: Once | INTRAMUSCULAR | Status: AC | PRN
  Administered 2021-08-22: 80 mL via INTRAVENOUS
  Filled 2021-08-22: qty 80

## 2021-08-22 MED ORDER — TRAMADOL HCL 50 MG PO TABS: 50.0000 mg | ORAL_TABLET | Freq: Four times a day (QID) | ORAL | 0 refills | Status: DC | PRN

## 2021-08-22 NOTE — ED Notes (Signed)
IV removed. Pt RX reviewed. Pt assisted off unit on foot. NO questions at this time

## 2021-08-22 NOTE — ED Triage Notes (Signed)
Pt via POV from home. Pt c/o sinus pressure, nasal congestion, and low grade fever for the past 2 weeks. Pt has been using OTC meds with no relief. Pt is A&Ox4 and NAD.

## 2021-08-22 NOTE — ED Notes (Signed)
Pt endorsing today R sinus dryness with pain radiating to ear, neck and shoulder. Pt endorsing that with certain positioning, "I can hear and feel my pulse and it hurts."

## 2021-08-22 NOTE — ED Provider Notes (Signed)
[facility] Emergency Department Provider Note  ____________________________________________   Event Date/Time   First MD Initiated Contact with Patient 08/22/21 1346     (approximate)  I have reviewed the triage vital signs and the nursing notes.   HISTORY  Chief Complaint Sinusitis    HPI Shannon Bryan is a 60 y.o. female presents emergency department complaining of sinus and ear pain that has now gone into the neck and back of her skull.  Patient states she has had fever on and off.  No cough or congestion.  States she has been taking over-the-counter medication but is not helping.  Also states she has pain in the right upper lung which is sharp.  Has some swelling of the lower extremities.  Cardiologist told her it was okay as her ejection fraction was normal.  Past Medical History:  Diagnosis Date   Asthma    Blood clot in vein    Chronic kidney disease    Hyperlipidemia    Hypertension    Kidney stone     Patient Active Problem List   Diagnosis Date Noted   Prediabetes 01/06/2019   Essential hypertension 12/22/2017    Past Surgical History:  Procedure Laterality Date   ABDOMINAL HYSTERECTOMY     ABLATION     BREAST RECONSTRUCTION WITH MASTOPLASTY Bilateral 03/11/2020   Procedure: BREAST RECONSTRUCTION WITH MASTOPLASTY;  Surgeon: Youlanda Roys, MD;  Location: [place];  Service: Plastics;  Laterality: Bilateral;   CHOLECYSTECTOMY     ROTATOR CUFF REPAIR Right    TUBAL LIGATION      Prior to Admission medications   Medication Sig Start Date End Date Taking? Authorizing Provider  acetaminophen (TYLENOL) 500 MG tablet Take 500 mg by mouth every 6 (six) hours as needed.    [provider]  amLODipine (NORVASC) 5 MG tablet Take 1 tablet (5 mg total) by mouth daily. 05/05/21 05/26/23  Wellington Hampshire, MD  aspirin EC 81 MG tablet Take 81 mg by mouth daily.    [provider]  carvedilol (COREG)  6.25 MG tablet Take 1 tablet (6.25 mg total) by mouth 2 (two) times daily. 05/05/21   Wellington Hampshire, MD  cetirizine (ZYRTEC) 10 MG tablet Take 10 mg by mouth daily.    [provider]  hydrochlorothiazide (HYDRODIURIL) 25 MG tablet Take 1 tablet (25 mg total) by mouth daily. 05/05/21   Wellington Hampshire, MD  ibuprofen (ADVIL) 800 MG tablet Take 1 tablet (800 mg total) by mouth 3 (three) times daily as needed. 11/20/19   Dunn, Areta Haber, PA-C  metoprolol tartrate (LOPRESSOR) 50 MG tablet Take 1 tablet (50 mg total) by mouth once for 1 dose. Take 2 hours before your cardiac CTA 06/11/21 06/11/21  Rise Mu, PA-C  Multiple Vitamins-Minerals (ONE-A-DAY 50 PLUS PO) Take 1 tablet by mouth daily.    [provider]  potassium chloride SA (KLOR-CON M20) 20 MEQ tablet Take 1 tablet (20 mEq total) by mouth daily. 05/12/21 08/10/21  Wellington Hampshire, MD  spironolactone (ALDACTONE) 25 MG tablet Take 1 tablet (25 mg total) by mouth daily. 05/05/21   Wellington Hampshire, MD  traZODone (DESYREL) 50 MG tablet Take 0.5-1 tablets (25-50 mg total) by mouth at bedtime as needed for sleep. 01/04/19   Mikey College, NP  varenicline (CHANTIX STARTING MONTH PAK) 0.5 MG X 11 & 1 MG X 42 tablet Take one 0.5 mg tablet by mouth once daily for 3  days, then increase to one 0.5 mg tablet twice daily for 4 days, then increase to one 1 mg tablet twice daily. 06/11/21   Rise Mu, PA-C    Allergies Bactrim [sulfamethoxazole-trimethoprim], Penicillins, Sulfa antibiotics, and Cleocin [clindamycin hcl]  Family History  Problem Relation Age of Onset   Heart disease Mother    Stroke Mother    Diabetes Mother    Cancer Mother        bone   Depression Mother    Emphysema Father     Social History Social History   Tobacco Use   Smoking status: Former    Packs/day: 0.25    Years: 30.00    Pack years: 7.50    Types: Cigarettes    Quit date: 02/03/2020    Years since quitting: 1.5   Smokeless tobacco:  Former   Tobacco comments:    1/4 - 1/2 ppd had quit for 1 year in 2010  Vaping Use   Vaping Use: Some days   Devices: Vaping  Substance Use Topics   Alcohol use: Yes    Comment: occasional   Drug use: No    Review of Systems  Constitutional: No fever/chills Eyes: No visual changes. ENT: No sore throat.  Positive for neck pain Respiratory: Denies cough Cardiovascular: Positive chest pain Gastrointestinal: Denies abdominal pain Genitourinary: Negative for dysuria. Musculoskeletal: Negative for back pain. Skin: Negative for rash. Psychiatric: no mood changes,     ____________________________________________   PHYSICAL EXAM:  VITAL SIGNS: ED Triage Vitals  Enc Vitals Group     BP 08/22/21 1334 (!) 147/78     Pulse Rate 08/22/21 1334 70     Resp 08/22/21 1334 20     Temp 08/22/21 1334 98.4 F (36.9 C)     Temp src --      SpO2 08/22/21 1334 99 %     Weight 08/22/21 1335 (!) 308 lb (139.7 kg)     Height 08/22/21 1335 5\' 9"  (1.753 m)     Head Circumference --      Peak Flow --      Pain Score 08/22/21 1335 4     Pain Loc --      Pain Edu? --      Excl. in [place]? --     Constitutional: Alert and oriented. Well appearing and in no acute distress. Eyes: Conjunctivae are normal.  Head: Atraumatic. Ears: Right TM is dull with fluid, no redness noted, left TM is slightly dull Nose: No congestion/rhinnorhea. Mouth/Throat: Mucous membranes are moist.  Throat appears normal Neck: Supple, tenderness and swelling noted at the posterior auricular area and into the neck Cardiovascular: Normal rate, regular rhythm. Heart sounds are normal, JVD noticed  Respiratory: Normal respiratory effort.  No retractions, lungs c t a  GU: deferred Musculoskeletal: FROM all extremities, warm and well perfused Neurologic:  Normal speech and language.  Skin:  Skin is warm, dry and intact. No rash noted. Psychiatric: Mood and affect are normal. Speech and behavior are  normal.  ____________________________________________   LABS (all labs ordered are listed, but only abnormal results are displayed)  Labs Reviewed  COMPREHENSIVE METABOLIC PANEL - Abnormal; Notable for the following components:      Result Value   Glucose, Bld 159 (*)    All other components within normal limits  BRAIN NATRIURETIC PEPTIDE  CBC WITH DIFFERENTIAL/PLATELET  TROPONIN I (HIGH SENSITIVITY)  TROPONIN I (HIGH SENSITIVITY)   ____________________________________________   ____________________________________________  RADIOLOGY  Chest x-ray  CT soft tissue of the neck  ____________________________________________   PROCEDURES  Procedure(s) performed: EKG shows normal sinus rhythm, see physician read   Procedures    ____________________________________________   INITIAL IMPRESSION / ASSESSMENT AND PLAN / ED COURSE  Pertinent labs & imaging results that were available during my care of the patient were reviewed by me and considered in my medical decision making (see chart for details).   Patient is a 63 female presents emergency department complaining of right-sided neck and shoulder pain.  Pain behind the right ear along with ear pain.  See HPI physical exam shows tenderness at the posterior auricular area, questionable mass versus lymphadenopathy along the right cervical chain, exaggerated JVD  EKG shows normal sinus rhythm, see physician read  Labs and imaging ordered  Labs are reassuring.  CBC, metabolic panel, BNP, and troponin are all normal  Chest x-ray shows bilateral opacities which may indicate pulmonary edema per the radiologist.  This was also reviewed by me  CT soft tissue of the neck pending Care transferred to Sherrie George, NP  Shannon Bryan was evaluated in Emergency Department on 08/22/2021 for the symptoms described in the history of present illness. She was evaluated in the context of the global COVID-19 pandemic, which necessitated  consideration that the patient might be at risk for infection with the SARS-CoV-2 virus that causes COVID-19. Institutional protocols and algorithms that pertain to the evaluation of patients at risk for COVID-19 are in a state of rapid change based on information released by regulatory bodies including the CDC and federal and state organizations. These policies and algorithms were followed during the patient's care in the ED.    As part of my medical decision making, I reviewed the following data within the [place] notes reviewed and incorporated, Labs reviewed , EKG interpreted NSR, Old chart reviewed, Patient signed out to Sherrie George, NP, Radiograph reviewed , Notes from prior ED visits, and  Controlled Substance Database  ____________________________________________   FINAL CLINICAL IMPRESSION(S) / ED DIAGNOSES  Final diagnoses:  Dysfunction of right eustachian tube  Neck pain      NEW MEDICATIONS STARTED DURING THIS VISIT:  New Prescriptions   No medications on file     Note:  This document was prepared using Dragon voice recognition software and may include unintentional dictation errors.    Versie Starks, PA-C 08/22/21 1552    Nena Polio, MD 08/22/21 [id number]

## 2021-10-08 ENCOUNTER — Telehealth: Payer: Self-pay | Admitting: Cardiology

## 2021-10-08 NOTE — Telephone Encounter (Signed)
What problem are you experiencing? Patient states that it has been hard to be compliant with her machine. She says that she has not been getting sleepy and is waking up earlier than normal. Not sure if pressure settings need to be adjusted.  Who is your medical equipment company? [employer]  Patient has sleep compliance appt on 12/14/21   Please route to the sleep study assistant.

## 2021-10-16 NOTE — Telephone Encounter (Signed)
Jennika, [place] 09/15/2021 - 10/14/2021 Patient ID: [id number] DOB: 05-Jun-1961 Age: 60 years 30.[address] [place], [id number] Compliance Report Usage 09/15/2021 - 10/14/2021 Usage days 24/30 days (80%) >= 4 hours 24 days (80%) < 4 hours 0 days (0%) Usage hours 176 hours 14 minutes Average usage (total days) 5 hours 52 minutes Average usage (days used) 7 hours 21 minutes Median usage (days used) 7 hours 29 minutes Total used hours (value since last reset - 10/14/2021) 349 hours AirSense 11 AutoSet Serial number [id number] Mode AutoSet Min Pressure 4 cmH2O Max Pressure 15 cmH2O EPR Ramp Only EPR level 1 Response Standard Therapy Pressure - cmH2O Median: 10.3 95th percentile: 14.0 Maximum: 14.7 Leaks - L/min Median: 0.4 95th percentile: 8.8 Maximum: 50.3 Events per hour AI: 0.1 HI: 0.2 AHI: 0.3 Apnea Index Central: 0.0 Obstructive: 0.0 Unknown: 0.1 RERA Index 0.0 Cheyne-Stokes respiration (average duration per night) 0 minutes (0%) Usage - hours Printed on 10/16/2021 - ResMed AirView version 4.39.0-4.0 Page 1 of

## 2021-10-21 ENCOUNTER — Ambulatory Visit: Payer: Self-pay | Admitting: *Deleted

## 2021-10-21 NOTE — Telephone Encounter (Signed)
Pt has cough, congestion, no fever, runny nose like a fire hydrant)covid negative. Only appt virtual for tomorrow.  Pt says she hopes they can do something today.      Reason for Disposition  Wheezing is present  Answer Assessment - Initial Assessment Questions 1. ONSET: "When did the cough begin?"      5 days ago  (Friday,gradual worsening since) 2. SEVERITY: "How bad is the cough today?"      Severe spells, keeping awake at night 3. SPUTUM: "Describe the color of your sputum" (none, dry cough; clear, white, yellow, green)     Mostly yellowish 4. HEMOPTYSIS: "Are you coughing up any blood?" If so ask: "How much?" (flecks, streaks, tablespoons, etc.)     no 5. DIFFICULTY BREATHING: "Are you having difficulty breathing?" If Yes, ask: "How bad is it?" (e.g., mild, moderate, severe)    - MILD: No SOB at rest, mild SOB with walking, speaks normally in sentences, can lie down, no retractions, pulse < 100.    - MODERATE: SOB at rest, SOB with minimal exertion and prefers to sit, cannot lie down flat, speaks in phrases, mild retractions, audible wheezing, pulse 100-120.    - SEVERE: Very SOB at rest, speaks in single words, struggling to breathe, sitting hunched forward, retractions, pulse > 120      With exertion 6. FEVER: "Do you have a fever?" If Yes, ask: "What is your temperature, how was it measured, and when did it start?"     No  CHills and sweating though 7. CARDIAC HISTORY: "Do you have any history of heart disease?" (e.g., heart attack, congestive heart failure)      *No Answer* 8. LUNG HISTORY: "Do you have any history of lung disease?"  (e.g., pulmonary embolus, asthma, emphysema)     *No Answer* 9. PE RISK FACTORS: "Do you have a history of blood clots?" (or: recent major surgery, recent prolonged travel, bedridden)     *No Answer* 10. OTHER SYMPTOMS: "Do you have any other symptoms?" (e.g., runny nose, wheezing, chest pain)       Wheezing,runny nose,sneezing, body aches "Mostly  in neck."  Protocols used: Cough - Acute Productive-A-AH

## 2021-10-21 NOTE — Telephone Encounter (Signed)
°  Chief Complaint: Productive cough Symptoms: severe cough, yellowish phlegm, wheezing, subjective fever with chills and sweating. SOB with minimal exertion Frequency: Onset 5 days ago Pertinent Negatives: Patient denies States COVID neg x 2 tests at workplace.  Disposition: [] ED /[x] Urgent Care (no appt availability in office) / [] Appointment(In office/virtual)/ []  [facility]/ [] Home Care/ [] Refused Recommended Disposition  Additional Notes: Audible wheezing during call, continuous non-stop coughing. Advised UC. States will follow disposition. Agent made virtual appt prior to triage. Appt cancelled.

## 2021-10-22 ENCOUNTER — Telehealth: Payer: Self-pay | Admitting: Family Medicine

## 2021-11-11 NOTE — Telephone Encounter (Signed)
Shannon Margarita, MD  Freada Bergeron, CMA If she tries melatonin for sleep would not use the Trazadone at night.  Patient says ok she will not use them together.

## 2021-12-13 NOTE — Progress Notes (Signed)
Virtual Visit via Video Note   This visit type was conducted due to national recommendations for restrictions regarding the COVID-19 Pandemic (e.g. social distancing) in an effort to limit this patient's exposure and mitigate transmission in our community.  Due to her co-morbid illnesses, this patient is at least at moderate risk for complications without adequate follow up.  This format is felt to be most appropriate for this patient at this time.  All issues noted in this document were discussed and addressed.  A limited physical exam was performed with this format.  Please refer to the patient's chart for her consent to telehealth for [facility].   Date:  12/14/2021   ID:  Shannon Bryan, DOB 1961-03-19, MRN [id number] The patient was identified using 2 identifiers.  Patient Location: Home Provider Location: Home Office   PCP:  Mikey College, NP (Inactive)   [facility] Providers Cardiologist:  Kathlyn Sacramento, MD Sleep Medicine:  Fransico Him, MD     Evaluation Performed:  Follow-Up Visit  Chief Complaint:  OSA  History of Present Illness:    Shannon Bryan is a 61 y.o. female with with a history of asthma, CKD, hyperlipidemia, morbid obesity and hypertension.  She was referred for home sleep testing by Christell Faith, PA.  She says that she feels tired in the am and wakes up with a headache.  She wakes up frequently at night and feels sleepy during the day.  She has problems waking up jerking or snoring and could not go back to sleep.  Home sleep study showed mild obstructive sleep apnea with an AHI of 6.8/h with no central events.  O2 saturations were as low as 82% but did not qualify for nocturnal hypoxemia.  She was started on auto CPAP from 4 to 15 cm H2O and is now here for follow-up.  She is doing well with her CPAP device and thinks that she has gotten used to it.  She tolerates the full face mask and feels the pressure is adequate.  Since going on CPAP she  feels rested in the am and has no significant daytime sleepiness.  She denies any significant mouth or nasal dryness or nasal congestion.  She does not think that he snores.   Her am headaches have resolved.  The patient does not have symptoms concerning for COVID-19 infection (fever, chills, cough, or new shortness of breath).    Past Medical History:  Diagnosis Date   Asthma    Blood clot in vein    Chronic kidney disease    Hyperlipidemia    Hypertension    Kidney stone    Past Surgical History:  Procedure Laterality Date   ABDOMINAL HYSTERECTOMY     ABLATION     BREAST RECONSTRUCTION WITH MASTOPLASTY Bilateral 03/11/2020   Procedure: BREAST RECONSTRUCTION WITH MASTOPLASTY;  Surgeon: Youlanda Roys, MD;  Location: [place];  Service: Plastics;  Laterality: Bilateral;   CHOLECYSTECTOMY     ROTATOR CUFF REPAIR Right    TUBAL LIGATION       Current Meds  Medication Sig   acetaminophen (TYLENOL) 500 MG tablet Take 500 mg by mouth every 6 (six) hours as needed.   albuterol (VENTOLIN HFA) 108 (90 Base) MCG/ACT inhaler SMARTSIG:2 Puff(s) By Mouth Every 6 Hours PRN   amLODipine (NORVASC) 5 MG tablet Take 1 tablet (5 mg total) by mouth daily.   aspirin EC 81 MG tablet Take 81 mg by mouth daily.   carvedilol (COREG) 6.25  MG tablet Take 1 tablet (6.25 mg total) by mouth 2 (two) times daily.   cetirizine (ZYRTEC) 10 MG tablet Take 10 mg by mouth daily.   hydrochlorothiazide (HYDRODIURIL) 25 MG tablet Take 1 tablet (25 mg total) by mouth daily.   ibuprofen (ADVIL) 800 MG tablet Take 1 tablet (800 mg total) by mouth 3 (three) times daily as needed.   Multiple Vitamins-Minerals (ONE-A-DAY 50 PLUS PO) Take 1 tablet by mouth daily.   potassium chloride SA (KLOR-CON M20) 20 MEQ tablet Take 1 tablet (20 mEq total) by mouth daily.   spironolactone (ALDACTONE) 25 MG tablet Take 1 tablet (25 mg total) by mouth daily.   traMADol (ULTRAM) 50 MG tablet Take 1 tablet (50 mg  total) by mouth every 6 (six) hours as needed.   traZODone (DESYREL) 50 MG tablet Take 0.5-1 tablets (25-50 mg total) by mouth at bedtime as needed for sleep.     Allergies:   Bactrim [sulfamethoxazole-trimethoprim], Penicillins, Sulfa antibiotics, and Cleocin [clindamycin hcl]   Social History   Tobacco Use   Smoking status: Former    Packs/day: 0.25    Years: 30.00    Pack years: 7.50    Types: Cigarettes    Quit date: 02/03/2020    Years since quitting: 1.8   Smokeless tobacco: Former   Tobacco comments:    1/4 - 1/2 ppd had quit for 1 year in 2010  Vaping Use   Vaping Use: Some days   Devices: Vaping  Substance Use Topics   Alcohol use: Yes    Comment: occasional   Drug use: No     Family Hx: The patient's family history includes Cancer in her mother; Depression in her mother; Diabetes in her mother; Emphysema in her father; Heart disease in her mother; Stroke in her mother.  ROS:   Please see the history of present illness.     All other systems reviewed and are negative.   Prior CV studies:   The following studies were reviewed today:  Home sleep study and PAP compliance download  Labs/Other Tests and Data Reviewed:    EKG:  No ECG reviewed.  Recent Labs: 08/22/2021: ALT 25; B Natriuretic Peptide 20.9; BUN 9; Creatinine, Ser 0.92; Hemoglobin 13.9; Platelets 265; Potassium 3.8; Sodium 138   Recent Lipid Panel Lab Results  Component Value Date/Time   CHOL 176 03/29/2017 08:33 AM   TRIG 99 03/29/2017 08:33 AM   HDL 42 (L) 03/29/2017 08:33 AM   CHOLHDL 4.2 03/29/2017 08:33 AM   LDLCALC 114 (H) 03/29/2017 08:33 AM    Wt Readings from Last 3 Encounters:  12/14/21 300 lb (136.1 kg)  08/22/21 (!) 308 lb (139.7 kg)  08/10/21 (!) 308 lb (139.7 kg)     Risk Assessment/Calculations:      STOP-Bang Score:  7      Objective:    Vital Signs:  BP (!) 149/91    Pulse 67    Ht 5\' 9"  (1.753 m)    Wt 300 lb (136.1 kg)    SpO2 97%    BMI 44.30 kg/m    VITAL  SIGNS:  reviewed GEN:  no acute distress EYES:  sclerae anicteric, EOMI - Extraocular Movements Intact RESPIRATORY:  normal respiratory effort, symmetric expansion CARDIOVASCULAR:  no peripheral edema SKIN:  no rash, lesions or ulcers. MUSCULOSKELETAL:  no obvious deformities. NEURO:  alert and oriented x 3, no obvious focal deficit PSYCH:  normal affect  ASSESSMENT & PLAN:    OSA - The  patient is tolerating PAP therapy well without any problems. The PAP download performed by his DME was personally reviewed and interpreted by me today and showed an AHI of 0.3/hr on auto PAP with 80% compliance in using more than 4 hours nightly.  The patient has been using and benefiting from PAP use and will continue to benefit from therapy.  -I have encouraged her to use a humidifier in her bedroom so she wont run out of water in the water chamber -I told her that she is fine to take melatonin for insomnia but stop the trazadone  Hypertension -BP is borderline controlled on exam today. -Continue prescription drug management.  Continue prescription drug management with amlodipine 5 mg daily, carvedilol 6.25 mg twice daily, HCTZ 25 mg daily and spironolactone 25 mg daily with as needed refills   COVID-19 Education: The signs and symptoms of COVID-19 were discussed with the patient and how to seek care for testing (follow up with PCP or arrange E-visit).  The importance of social distancing was discussed today.  Time:   Today, I have spent 15 minutes with the patient with telehealth technology discussing the above problems.     Medication Adjustments/Labs and Tests Ordered: Current medicines are reviewed at length with the patient today.  Concerns regarding medicines are outlined above.   Tests Ordered: No orders of the defined types were placed in this encounter.   Medication Changes: No orders of the defined types were placed in this encounter.   Follow Up:  In Person in 1  year(s)  Signed, Fransico Him, MD  12/14/2021 9:44 AM    [place]

## 2021-12-14 ENCOUNTER — Encounter: Payer: Self-pay | Admitting: Cardiology

## 2021-12-14 ENCOUNTER — Other Ambulatory Visit: Payer: Self-pay

## 2021-12-14 ENCOUNTER — Telehealth (INDEPENDENT_AMBULATORY_CARE_PROVIDER_SITE_OTHER): Payer: 59 | Admitting: Cardiology

## 2021-12-14 VITALS — BP 149/91 | HR 67 | Ht 69.0 in | Wt 300.0 lb

## 2021-12-14 DIAGNOSIS — G4733 Obstructive sleep apnea (adult) (pediatric): Secondary | ICD-10-CM

## 2021-12-14 DIAGNOSIS — I10 Essential (primary) hypertension: Secondary | ICD-10-CM | POA: Diagnosis not present

## 2021-12-16 ENCOUNTER — Telehealth: Payer: Self-pay | Admitting: *Deleted

## 2021-12-16 DIAGNOSIS — G4733 Obstructive sleep apnea (adult) (pediatric): Secondary | ICD-10-CM

## 2021-12-16 NOTE — Telephone Encounter (Signed)
-----   Message from Antonieta Iba, RN sent at 12/14/2021  9:56 AM EST ----- Per Dr. Radford Pax: place order with DME for PAP supplies  Thanks!

## 2021-12-16 NOTE — Telephone Encounter (Signed)
Order placed to [facility] for PAP supplies via community message.

## 2022-02-25 ENCOUNTER — Other Ambulatory Visit
Admission: RE | Admit: 2022-02-25 | Discharge: 2022-02-25 | Disposition: A | Discharge status: Home / Self Care | Payer: 59 | Attending: Cardiovascular Disease | Admitting: Cardiovascular Disease

## 2022-02-25 ENCOUNTER — Encounter: Payer: Self-pay | Admitting: Cardiovascular Disease

## 2022-02-25 ENCOUNTER — Ambulatory Visit (INDEPENDENT_AMBULATORY_CARE_PROVIDER_SITE_OTHER): Payer: 59 | Admitting: Cardiovascular Disease

## 2022-02-25 VITALS — BP 120/68 | HR 60 | Ht 69.0 in | Wt 314.4 lb

## 2022-02-25 DIAGNOSIS — R739 Hyperglycemia, unspecified: Secondary | ICD-10-CM

## 2022-02-25 DIAGNOSIS — E785 Hyperlipidemia, unspecified: Secondary | ICD-10-CM

## 2022-02-25 DIAGNOSIS — I10 Essential (primary) hypertension: Secondary | ICD-10-CM | POA: Insufficient documentation

## 2022-02-25 LAB — CBC WITH DIFFERENTIAL/PLATELET
Abs Immature Granulocytes: 0.02 10*3/uL (ref 0.00–0.07)
Basophils Absolute: 0.1 10*3/uL (ref 0.0–0.1)
Basophils Relative: 1 %
Eosinophils Absolute: 0.1 10*3/uL (ref 0.0–0.5)
Eosinophils Relative: 1 %
HCT: 43 % (ref 36.0–46.0)
Hemoglobin: 13.9 g/dL (ref 12.0–15.0)
Immature Granulocytes: 0 %
Lymphocytes Relative: 36 %
Lymphs Abs: 2.7 10*3/uL (ref 0.7–4.0)
MCH: 26.7 pg (ref 26.0–34.0)
MCHC: 32.3 g/dL (ref 30.0–36.0)
MCV: 82.7 fL (ref 80.0–100.0)
Monocytes Absolute: 0.3 10*3/uL (ref 0.1–1.0)
Monocytes Relative: 4 %
Neutro Abs: 4.2 10*3/uL (ref 1.7–7.7)
Neutrophils Relative %: 58 %
Platelets: 266 10*3/uL (ref 150–400)
RBC: 5.2 MIL/uL — ABNORMAL HIGH (ref 3.87–5.11)
RDW: 14.6 % (ref 11.5–15.5)
WBC: 7.3 10*3/uL (ref 4.0–10.5)
nRBC: 0 % (ref 0.0–0.2)

## 2022-02-25 LAB — COMPREHENSIVE METABOLIC PANEL
ALT: 29 U/L (ref 0–44)
AST: 34 U/L (ref 15–41)
Albumin: 3.9 g/dL (ref 3.5–5.0)
Alkaline Phosphatase: 84 U/L (ref 38–126)
Anion gap: 10 (ref 5–15)
BUN: 11 mg/dL (ref 6–20)
CO2: 25 mmol/L (ref 22–32)
Calcium: 9.1 mg/dL (ref 8.9–10.3)
Chloride: 101 mmol/L (ref 98–111)
Creatinine, Ser: 0.87 mg/dL (ref 0.44–1.00)
GFR, Estimated: >60 mL/min (ref >60)
Glucose, Bld: 283 mg/dL — ABNORMAL HIGH (ref 70–99)
Potassium: 3.5 mmol/L (ref 3.5–5.1)
Sodium: 136 mmol/L (ref 135–145)
Total Bilirubin: 0.6 mg/dL (ref 0.3–1.2)
Total Protein: 7.5 g/dL (ref 6.5–8.1)

## 2022-02-25 LAB — HEMOGLOBIN A1C
Hgb A1c MFr Bld: 9.1 % — ABNORMAL HIGH (ref 4.8–5.6)
Mean Plasma Glucose: 214.47 mg/dL

## 2022-02-25 LAB — TSH: TSH: 1.276 u[IU]/mL (ref 0.350–4.500)

## 2022-02-25 MED ORDER — POTASSIUM CHLORIDE CRYS ER 20 MEQ PO TBCR: 20.0000 meq | EXTENDED_RELEASE_TABLET | Freq: Every day | ORAL | 0 refills | Status: DC

## 2022-02-25 NOTE — Patient Instructions (Signed)
Medication Instructions:  ?Your physician recommends that you continue on your current medications as directed. Please refer to the Current Medication list given to you today. ? ?*If you need a refill on your cardiac medications before your next appointment, please call your pharmacy* ? ? ?Lab Work: ?Cbc, Cmp, HgA1c, Tsh ? ?Please have your labs drawn at the [facility]. No appt needed. Lab hours are Mon-Fri 7am-6pm. ? ?Stop at the Registration desk to check in. ? ?If you have labs (blood work) drawn today and your tests are completely normal, you will receive your results only by: ?MyChart Message (if you have MyChart) OR ?A paper copy in the mail ?If you have any lab test that is abnormal or we need to change your treatment, we will call you to review the results. ? ? ?Testing/Procedures: ?None ordered ? ? ?Follow-Up: ?At [facility], you and your health needs are our priority.  As part of our continuing mission to provide you with exceptional heart care, we have created designated Provider Care Teams.  These Care Teams include your primary Cardiologist (physician) and Advanced Practice Providers (APPs -  Physician Assistants and Nurse Practitioners) who all work together to provide you with the care you need, when you need it. ? ?We recommend signing up for the patient portal called "MyChart".  Sign up information is provided on this After Visit Summary.  MyChart is used to connect with patients for Virtual Visits (Telemedicine).  Patients are able to view lab/test results, encounter notes, upcoming appointments, etc.  Non-urgent messages can be sent to your provider as well.   ?To learn more about what you can do with MyChart, go to [email or web address].   ? ?Your next appointment:   ?Your physician wants you to follow-up in: 6 months You will receive a reminder letter in the mail two months in advance. If you don't receive a letter, please call our office to schedule the follow-up  appointment. ? ? ?The format for your next appointment:   ?In Person ? ?Provider:   ?You may see Kathlyn Sacramento, MD or one of the following Advanced Practice Providers on your designated Care Team:   ?Murray Hodgkins, NP ?Christell Faith, PA-C ?Cadence Kathlen Mody, PA-C{ ? ? ? ?Other Instructions ?N/A ? ?Important Information About Sugar ? ? ? ? ? ? ?

## 2022-02-25 NOTE — Progress Notes (Signed)
?  ?Cardiology Office Note ? ? ?Date:  02/25/2022  ? ?ID:  Shannon Bryan, DOB 1960-12-12, MRN [id number] ? ?PCP:  Mikey College, NP (Inactive)  ?Cardiologist: Kathlyn Sacramento, MD ? ?Chief Complaint  ?Patient presents with  ? Other  ?  6 month f/u c/o edema, sob with congestion/ phlegm and elevated blood sugar levels Meds reviewed verbally with pt.  ? ? ?  ?History of Present Illness: ?Shannon Bryan is a 61 y.o. female who presents for a follow up visit regarding refractory hypertension. ?She has known history of essential hypertension, hyperlipidemia, tobacco use and obesity. ?She was seen in March, 2020 for worsening exertional dyspnea and chest pain in the setting of uncontrolled hypertension with blood pressure as high as 169 systolic.  She was on hydrochlorothiazide 25 mg once daily.  I added carvedilol 6.25 mg twice daily. ? ?She underwent a Lexiscan Myoview which showed fixed anterior wall defect likely due to breast attenuation.  Ejection fraction estimation was not accurate.  Echocardiogram showed an EF of 55 to 60% with no significant valvular abnormalities or pulmonary hypertension. ? ?She had breast reduction surgery in May 2021.  She had COVID-19 infection 3 times. ? ?She had worsening shortness of breath and chest pain in July 2022.  She underwent an echocardiogram which showed normal LV systolic function and no significant valvular abnormalities.  She underwent cardiac CTA in August 2022 which showed a calcium score of 0 with no evidence of obstructive coronary artery disease.  Home sleep study confirmed sleep apnea.  She is currently on CPAP.  She quit smoking last year.  She has been checking her blood sugar at work and noticed high readings occasionally above 300.  She feels slightly dizzy.  She has no history of diabetes.  She has not seen her primary care physician in 3 years. ? ? ? ?Past Medical History:  ?Diagnosis Date  ? Asthma   ? Blood clot in vein   ? Chronic kidney disease   ?  Hyperlipidemia   ? Hypertension   ? Kidney stone   ? ? ?Past Surgical History:  ?Procedure Laterality Date  ? ABDOMINAL HYSTERECTOMY    ? ABLATION    ? BREAST RECONSTRUCTION WITH MASTOPLASTY Bilateral 03/11/2020  ? Procedure: BREAST RECONSTRUCTION WITH MASTOPLASTY;  Surgeon: Contogiannis, Audrea Muscat, MD;  Location: [facility];  Service: Plastics;  Laterality: Bilateral;  ? CHOLECYSTECTOMY    ? ROTATOR CUFF REPAIR Right   ? TUBAL LIGATION    ? ? ? ?Current Outpatient Medications  ?Medication Sig Dispense Refill  ? acetaminophen (TYLENOL) 500 MG tablet Take 500 mg by mouth every 6 (six) hours as needed.    ? albuterol (VENTOLIN HFA) 108 (90 Base) MCG/ACT inhaler SMARTSIG:2 Puff(s) By Mouth Every 6 Hours PRN    ? amLODipine (NORVASC) 5 MG tablet Take 1 tablet (5 mg total) by mouth daily. 90 tablet 3  ? aspirin EC 81 MG tablet Take 81 mg by mouth daily.    ? carvedilol (COREG) 6.25 MG tablet Take 1 tablet (6.25 mg total) by mouth 2 (two) times daily. 180 tablet 3  ? cetirizine (ZYRTEC) 10 MG tablet Take 10 mg by mouth daily.    ? hydrochlorothiazide (HYDRODIURIL) 25 MG tablet Take 1 tablet (25 mg total) by mouth daily. 90 tablet 3  ? ibuprofen (ADVIL) 800 MG tablet Take 1 tablet (800 mg total) by mouth 3 (three) times daily as needed. 90 tablet 0  ? metoprolol tartrate (LOPRESSOR) 50  MG tablet Take 1 tablet (50 mg total) by mouth once for 1 dose. Take 2 hours before your cardiac CTA 1 tablet 0  ? Multiple Vitamins-Minerals (ONE-A-DAY 50 PLUS PO) Take 1 tablet by mouth daily.    ? potassium chloride SA (KLOR-CON M20) 20 MEQ tablet Take 1 tablet (20 mEq total) by mouth daily. 30 tablet 2  ? spironolactone (ALDACTONE) 25 MG tablet Take 1 tablet (25 mg total) by mouth daily. 90 tablet 3  ? traMADol (ULTRAM) 50 MG tablet Take 1 tablet (50 mg total) by mouth every 6 (six) hours as needed. 12 tablet 0  ? traZODone (DESYREL) 50 MG tablet Take 0.5-1 tablets (25-50 mg total) by mouth at bedtime as needed for sleep. 30  tablet 5  ? varenicline (CHANTIX STARTING MONTH PAK) 0.5 MG X 11 & 1 MG X 42 tablet Take one 0.5 mg tablet by mouth once daily for 3 days, then increase to one 0.5 mg tablet twice daily for 4 days, then increase to one 1 mg tablet twice daily. 53 tablet 0  ? ?No current facility-administered medications for this visit.  ? ? ?Allergies:   Bactrim [sulfamethoxazole-trimethoprim], Penicillins, Sulfa antibiotics, and Cleocin [clindamycin hcl]  ? ? ?Social History:  The patient  reports that she quit smoking about 2 years ago. Her smoking use included cigarettes. She has a 7.50 pack-year smoking history. She has quit using smokeless tobacco. She reports current alcohol use. She reports that she does not use drugs.  ? ?Family History:  The patient's family history includes Cancer in her mother; Depression in her mother; Diabetes in her mother; Emphysema in her father; Heart disease in her mother; Stroke in her mother.  ? ? ?ROS:  Please see the history of present illness.   Otherwise, review of systems are positive for none.   All other systems are reviewed and negative.  ? ? ?PHYSICAL EXAM: ?VS:  BP 120/68 (BP Location: Left Arm, Patient Position: Sitting, Cuff Size: Large)   Pulse 60   Ht '5\' 9"'$  (1.753 m)   Wt (!) 314 lb 6 oz (142.6 kg)   SpO2 98%   BMI 46.43 kg/m?  , BMI Body mass index is 46.43 kg/m?. ?GEN: Well nourished, well developed, in no acute distress ?HEENT: normal ?Neck: no JVD, carotid bruits, or masses ?Cardiac: RRR; no murmurs, rubs, or gallops, trace edema  ?Respiratory:  clear to auscultation bilaterally, normal work of breathing ?GI: soft, nontender, nondistended, + BS ?MS: no deformity or atrophy ?Skin: warm and dry, no rash ?Neuro:  Strength and sensation are intact ?Psych: euthymic mood, full affect ? ? ?EKG:  EKG is ordered today. ?The ekg ordered today demonstrates normal sinus rhythm with low voltage and nonspecific T wave changes. ? ? ?Recent Labs: ?08/22/2021: ALT 25; B Natriuretic Peptide  20.9; BUN 9; Creatinine, Ser 0.92; Hemoglobin 13.9; Platelets 265; Potassium 3.8; Sodium 138  ? ? ?Lipid Panel ?   ?Component Value Date/Time  ? CHOL 176 03/29/2017 0833  ? TRIG 99 03/29/2017 0833  ? HDL 42 (L) 03/29/2017 8299  ? CHOLHDL 4.2 03/29/2017 0833  ? VLDL 20 03/29/2017 0833  ? [place] 114 (H) 03/29/2017 3716  ? ?  ? ?Wt Readings from Last 3 Encounters:  ?02/25/22 (!) 314 lb 6 oz (142.6 kg)  ?12/14/21 300 lb (136.1 kg)  ?08/22/21 (!) 308 lb (139.7 kg)  ?  ? ? ? ?ASSESSMENT AND PLAN: ? ?1.  Essential hypertension: Blood pressures well controlled on current medications. ? ?  2.  Exertional dyspnea: There is likely a component of physical deconditioning.  Cardiac CTA was normal last year.  Her echocardiogram was also normal. ?  ?3.  Mild hyperlipidemia: No indication for treatment at the present time unless we confirm diabetes mellitus. ?  ?4.  Hyperglycemia: She is likely diabetic.  I requested routine labs today including hemoglobin A1c. ?  ?5.  Morbid obesity: Her weight did increase after she quit smoking. ? ? ? ?Disposition:   The patient will need to establish with her primary care physician.  She is to follow-up at [facility] with her nurse practitioner is no longer there.  We will try to get her back and with Dr. Parks Ranger pending results of her labs. ? ? ?Signed, ?Kathlyn Sacramento, MD ?02/25/22 ?[place] ?[phone] ? ? ?

## 2022-02-26 ENCOUNTER — Telehealth: Payer: Self-pay

## 2022-02-26 NOTE — Telephone Encounter (Signed)
-----   Message from Wellington Hampshire, MD sent at 02/26/2022  2:02 PM EDT ----- ?Inform patient that labs confirmed that she has uncontrolled diabetes with a glucose of 283 and hemoglobin A1c of 9.1. ?The patient was previously managed by Cassell Smiles but I do not think she is still practicing. ? ?I am forwarding this to Dr. Parks Ranger to see if he can see the patient.  Thank you. ?

## 2022-02-26 NOTE — Telephone Encounter (Addendum)
Called to give the pt lab results. Lmtcb. ?

## 2022-03-01 NOTE — Telephone Encounter (Signed)
Patient made aware of lab results. ?She doe snot currently have a pcp. ?Provided her the telephone number for Dr. Parks Ranger office to call and scheduled a new pt appt. ?Advised her that she needs to establish care asap for management of her uncontrolled diabetes. ? ?Patient verbalized understanding and  voiced appreciation for the assistance. ?

## 2022-03-01 NOTE — Telephone Encounter (Signed)
Pt returning nurses call regarding test results. Please advise 

## 2022-03-15 ENCOUNTER — Ambulatory Visit: Payer: 59 | Admitting: Internal Medicine

## 2022-03-15 ENCOUNTER — Encounter: Payer: Self-pay | Admitting: Internal Medicine

## 2022-03-15 VITALS — BP 128/76 | HR 74 | Temp 96.8°F | Ht 69.0 in | Wt 310.0 lb

## 2022-03-15 DIAGNOSIS — I700 Atherosclerosis of aorta: Secondary | ICD-10-CM | POA: Diagnosis not present

## 2022-03-15 DIAGNOSIS — E1169 Type 2 diabetes mellitus with other specified complication: Secondary | ICD-10-CM | POA: Insufficient documentation

## 2022-03-15 DIAGNOSIS — E785 Hyperlipidemia, unspecified: Secondary | ICD-10-CM | POA: Insufficient documentation

## 2022-03-15 DIAGNOSIS — M159 Polyosteoarthritis, unspecified: Secondary | ICD-10-CM

## 2022-03-15 DIAGNOSIS — M150 Primary generalized (osteo)arthritis: Secondary | ICD-10-CM

## 2022-03-15 DIAGNOSIS — E1165 Type 2 diabetes mellitus with hyperglycemia: Secondary | ICD-10-CM | POA: Diagnosis not present

## 2022-03-15 DIAGNOSIS — E7800 Pure hypercholesterolemia, unspecified: Secondary | ICD-10-CM

## 2022-03-15 DIAGNOSIS — E119 Type 2 diabetes mellitus without complications: Secondary | ICD-10-CM | POA: Insufficient documentation

## 2022-03-15 DIAGNOSIS — M1990 Unspecified osteoarthritis, unspecified site: Secondary | ICD-10-CM | POA: Insufficient documentation

## 2022-03-15 DIAGNOSIS — Z1211 Encounter for screening for malignant neoplasm of colon: Secondary | ICD-10-CM

## 2022-03-15 DIAGNOSIS — Z1231 Encounter for screening mammogram for malignant neoplasm of breast: Secondary | ICD-10-CM

## 2022-03-15 DIAGNOSIS — E66812 Obesity, class 2: Secondary | ICD-10-CM | POA: Insufficient documentation

## 2022-03-15 DIAGNOSIS — I10 Essential (primary) hypertension: Secondary | ICD-10-CM

## 2022-03-15 MED ORDER — OZEMPIC (0.25 OR 0.5 MG/DOSE) 2 MG/1.5ML ~~LOC~~ SOPN: 0.2500 mg | PEN_INJECTOR | SUBCUTANEOUS | 3 refills | Status: DC

## 2022-03-15 MED ORDER — IBUPROFEN 800 MG PO TABS: 800.0000 mg | ORAL_TABLET | Freq: Three times a day (TID) | ORAL | 0 refills | Status: DC | PRN

## 2022-03-15 NOTE — Progress Notes (Signed)
? ?Subjective:  ? ? Patient ID: Shannon Bryan, female    DOB: 1961/05/11, 61 y.o.   MRN: [id number] ? ?HPI ? ?Patient presents to clinic today for follow-up of chronic conditions. ? ?HTN: Her BP today is 128/76.  She is taking Amlodipine, Carvedilol, HCTZ, Spironolactone and Potassium as prescribed.  ECG from 01/2022 reviewed. ? ?DM2 her last A1c was 9.1%, 01/2022.  She is not taking any oral diabetic medication at this time.  She does not check her sugars. ? ?HLD with Aortic Atherosclerosis: Her last LDL was 114, triglycerides 99, 2018.  She is not taking any cholesterol-lowering medication at this time.  She does not consume a low-fat diet. ? ?OSA: She averages 8 hours of sleep per night with the use of her CPAP.  Sleep study from 07/2021 reviewed. ? ?OA: Mainly in her back and knees.  She takes Ibuprofen as needed with good relief of symptoms.  She does not follow with orthopedics. ? ?Review of Systems ? ?   ?Past Medical History:  ?Diagnosis Date  ? Asthma   ? Blood clot in vein   ? Chronic kidney disease   ? Hyperlipidemia   ? Hypertension   ? Kidney stone   ? ? ?Current Outpatient Medications  ?Medication Sig Dispense Refill  ? acetaminophen (TYLENOL) 500 MG tablet Take 500 mg by mouth every 6 (six) hours as needed.    ? albuterol (VENTOLIN HFA) 108 (90 Base) MCG/ACT inhaler SMARTSIG:2 Puff(s) By Mouth Every 6 Hours PRN    ? amLODipine (NORVASC) 5 MG tablet Take 1 tablet (5 mg total) by mouth daily. 90 tablet 3  ? aspirin EC 81 MG tablet Take 81 mg by mouth daily.    ? carvedilol (COREG) 6.25 MG tablet Take 1 tablet (6.25 mg total) by mouth 2 (two) times daily. 180 tablet 3  ? cetirizine (ZYRTEC) 10 MG tablet Take 10 mg by mouth daily.    ? hydrochlorothiazide (HYDRODIURIL) 25 MG tablet Take 1 tablet (25 mg total) by mouth daily. 90 tablet 3  ? ibuprofen (ADVIL) 800 MG tablet Take 1 tablet (800 mg total) by mouth 3 (three) times daily as needed. 90 tablet 0  ? Multiple Vitamins-Minerals (ONE-A-DAY 50 PLUS PO)  Take 1 tablet by mouth daily.    ? potassium chloride SA (KLOR-CON M20) 20 MEQ tablet Take 1 tablet (20 mEq total) by mouth daily. 90 tablet 0  ? spironolactone (ALDACTONE) 25 MG tablet Take 1 tablet (25 mg total) by mouth daily. 90 tablet 3  ? traMADol (ULTRAM) 50 MG tablet Take 1 tablet (50 mg total) by mouth every 6 (six) hours as needed. 12 tablet 0  ? traZODone (DESYREL) 50 MG tablet Take 0.5-1 tablets (25-50 mg total) by mouth at bedtime as needed for sleep. 30 tablet 5  ? ?No current facility-administered medications for this visit.  ? ? ?Allergies  ?Allergen Reactions  ? Bactrim [Sulfamethoxazole-Trimethoprim] Hives  ? Penicillins Swelling and Other (See Comments)  ? Sulfa Antibiotics Other (See Comments)  ? Cleocin [Clindamycin Hcl] Rash  ? ? ?Family History  ?Problem Relation Age of Onset  ? Heart disease Mother   ? Stroke Mother   ? Diabetes Mother   ? Cancer Mother   ?     bone  ? Depression Mother   ? Emphysema Father   ? ? ?Social History  ? ?Socioeconomic History  ? Marital status: Single  ?  Spouse name: Not on file  ? Number of children: Not on  file  ? Years of education: Not on file  ? Highest education level: Not on file  ?Occupational History  ? Not on file  ?Tobacco Use  ? Smoking status: Former  ?  Packs/day: 0.25  ?  Years: 30.00  ?  Pack years: 7.50  ?  Types: Cigarettes  ?  Quit date: 02/03/2020  ?  Years since quitting: 2.1  ? Smokeless tobacco: Former  ? Tobacco comments:  ?  1/4 - 1/2 ppd had quit for 1 year in 2010  ?Vaping Use  ? Vaping Use: Some days  ? Devices: Vaping  ?Substance and Sexual Activity  ? Alcohol use: Yes  ?  Comment: occasional  ? Drug use: No  ? Sexual activity: Never  ?Other Topics Concern  ? Not on file  ?Social History Narrative  ? Not on file  ? ?Social Determinants of Health  ? ?Financial Resource Strain: Not on file  ?Food Insecurity: Not on file  ?Transportation Needs: Not on file  ?Physical Activity: Not on file  ?Stress: Not on file  ?Social Connections: Not on  file  ?Intimate Partner Violence: Not on file  ? ? ? ?Constitutional: Denies fever, malaise, fatigue, headache or abrupt weight changes.  ?HEENT: Pt reports blurred vision. Denies eye pain, eye redness, ear pain, ringing in the ears, wax buildup, runny nose, nasal congestion, bloody nose, or sore throat. ?Respiratory: Denies difficulty breathing, shortness of breath, cough or sputum production.   ?Cardiovascular: Denies chest pain, chest tightness, palpitations or swelling in the hands or feet.  ?Gastrointestinal: Pt reports increased thirst. Denies abdominal pain, bloating, constipation, diarrhea or blood in the stool.  ?GU: Pt reports urinary frequency. Denies urgency, pain with urination, burning sensation, blood in urine, odor or discharge. ?Musculoskeletal: Patient reports joint pain.  Denies decrease in range of motion, difficulty with gait, muscle pain or joint swelling.  ?Skin: Denies redness, rashes, lesions or ulcercations.  ?Neurological: Pt reports paresthesia of upper and lower extremities. Denies dizziness, difficulty with memory, difficulty with speech or problems with balance and coordination.  ?Psych: Denies anxiety, depression, SI/HI. ? ?No other specific complaints in a complete review of systems (except as listed in HPI above). ? ?Objective:  ? Physical Exam ? ?BP 128/76 (BP Location: Left Arm, Patient Position: Sitting, Cuff Size: Large)   Pulse 74   Temp (!) 96.8 ?F (36 ?C) (Temporal)   Ht '5\' 9"'$  (1.753 m)   Wt (!) 310 lb (140.6 kg)   SpO2 99%   BMI 45.78 kg/m?  ? ?Wt Readings from Last 3 Encounters:  ?02/25/22 (!) 314 lb 6 oz (142.6 kg)  ?12/14/21 300 lb (136.1 kg)  ?08/22/21 (!) 308 lb (139.7 kg)  ? ? ?General: Appears her stated age, obese, in NAD. ?Skin: Warm, dry and intact. No  ulcerations noted. ?HEENT: Head: normal shape and size; Eyes: sclera white, no icterus, conjunctiva pink, PERRLA and EOMs intact;  ?Cardiovascular: Normal rate and rhythm. S1,S2 noted.  No murmur, rubs or  gallops noted. No JVD or BLE edema. No carotid bruits noted. ?Pulmonary/Chest: Normal effort and positive vesicular breath sounds. No respiratory distress. No wheezes, rales or ronchi noted.  ?Musculoskeletal: No difficulty with gait.  ?Neurological: Alert and oriented. Coordination normal.  ?Psychiatric: Mood and affect normal. Behavior is normal. Judgment and thought content normal.  ? ? ? ?BMET ?   ?Component Value Date/Time  ? NA 136 02/25/2022 1725  ? NA 141 06/11/2021 0927  ? K 3.5 02/25/2022 1725  ?  CL 101 02/25/2022 1725  ? CO2 25 02/25/2022 1725  ? GLUCOSE 283 (H) 02/25/2022 1725  ? BUN 11 02/25/2022 1725  ? BUN 12 06/11/2021 0927  ? CREATININE 0.87 02/25/2022 1725  ? CREATININE 0.92 01/05/2019 1101  ? CALCIUM 9.1 02/25/2022 1725  ? GFRNONAA >60 02/25/2022 1725  ? GFRNONAA 70 01/05/2019 1101  ? GFRAA >60 06/20/2020 1936  ? GFRAA 81 01/05/2019 1101  ? ? ?Lipid Panel  ?   ?Component Value Date/Time  ? CHOL 176 03/29/2017 0833  ? TRIG 99 03/29/2017 0833  ? HDL 42 (L) 03/29/2017 5885  ? CHOLHDL 4.2 03/29/2017 0833  ? VLDL 20 03/29/2017 0833  ? LDLCALC 114 (H) 03/29/2017 0277  ? ? ?CBC ?   ?Component Value Date/Time  ? WBC 7.3 02/25/2022 1725  ? RBC 5.20 (H) 02/25/2022 1725  ? HGB 13.9 02/25/2022 1725  ? HCT 43.0 02/25/2022 1725  ? PLT 266 02/25/2022 1725  ? MCV 82.7 02/25/2022 1725  ? MCH 26.7 02/25/2022 1725  ? MCHC 32.3 02/25/2022 1725  ? RDW 14.6 02/25/2022 1725  ? LYMPHSABS 2.7 02/25/2022 1725  ? MONOABS 0.3 02/25/2022 1725  ? EOSABS 0.1 02/25/2022 1725  ? BASOSABS 0.1 02/25/2022 1725  ? ? ?Hgb A1C ?Lab Results  ?Component Value Date  ? HGBA1C 9.1 (H) 02/25/2022  ? ? ? ? ? ? ? ?   ?Assessment & Plan:  ? ? ?Webb Silversmith, NP ? ? ?

## 2022-03-15 NOTE — Assessment & Plan Note (Signed)
Encourage weight loss as this can help reduce joint pain ?Ibuprofen refilled, advised her not to take this consistently ?

## 2022-03-15 NOTE — Assessment & Plan Note (Signed)
A1c reviewed ?Urine microalbumin today ?Referral to diabetes education and nutrition ?We will start Ozempic 0.25 mg weekly x4 weeks then increase to 0.5 mg ?Encourage low-carb diet and exercise for weight loss ?Advised her to schedule an appointment for diabetic eye exam ?Encourage routine foot exam ?We will discuss immunizations at her next visit ?

## 2022-03-15 NOTE — Assessment & Plan Note (Signed)
Controlled on Amlodipine, Carvedilol, HCTZ, Spironolactone and Potassium ?Reinforced DASH diet and exercise for weight loss ?Kidney function reviewed ?

## 2022-03-15 NOTE — Assessment & Plan Note (Signed)
Encouraged diet and exercise for weight loss ?We will start Ozempic for blood sugar and weight control ?

## 2022-03-15 NOTE — Addendum Note (Signed)
Addended by: Jearld Fenton on: 03/15/2022 01:19 PM ? ? Modules accepted: Orders ? ?

## 2022-03-15 NOTE — Patient Instructions (Signed)

## 2022-03-15 NOTE — Assessment & Plan Note (Signed)
Encouraged her to consume a low-fat diet ?Lipid profile today ?We will need statin therapy if LDL >100 ?

## 2022-03-15 NOTE — Assessment & Plan Note (Signed)
Encouraged her to consume a low-fat diet ?Lipid profile today ?We will need statin therapy if LDL >100 ?Continue aspirin ?

## 2022-03-16 LAB — LIPID PANEL
Cholesterol: 197 mg/dL (ref <200)
HDL: 38 mg/dL — ABNORMAL LOW (ref >=50)
LDL Cholesterol (Calc): 136 mg/dL (calc) — ABNORMAL HIGH
Non-HDL Cholesterol (Calc): 159 mg/dL (calc) — ABNORMAL HIGH (ref <130)
Total CHOL/HDL Ratio: 5.2 (calc) — ABNORMAL HIGH (ref <5.0)
Triglycerides: 121 mg/dL (ref <150)

## 2022-03-16 LAB — MICROALBUMIN / CREATININE URINE RATIO
Creatinine, Urine: 191 mg/dL (ref 20–275)
Microalb Creat Ratio: 4 mcg/mg creat (ref <30)
Microalb, Ur: 0.7 mg/dL

## 2022-03-17 ENCOUNTER — Telehealth: Payer: Self-pay

## 2022-03-17 ENCOUNTER — Other Ambulatory Visit: Payer: Self-pay

## 2022-03-17 DIAGNOSIS — Z1211 Encounter for screening for malignant neoplasm of colon: Secondary | ICD-10-CM

## 2022-03-17 MED ORDER — PRAVASTATIN SODIUM 10 MG PO TABS: 10.0000 mg | ORAL_TABLET | Freq: Every day | ORAL | 2 refills | Status: DC

## 2022-03-17 MED ORDER — NA SULFATE-K SULFATE-MG SULF 17.5-3.13-1.6 GM/177ML PO SOLN: 1.0000 | Freq: Once | ORAL | 0 refills | Status: AC

## 2022-03-17 NOTE — Telephone Encounter (Signed)
Pt advised.  She agreed to try pravastatin.  RX sent to [facility].  Apt scheduled for 06/22/2022. ? ? ?Thanks,  ? ?-Mickel Baas ?

## 2022-03-17 NOTE — Telephone Encounter (Signed)
-----   Message from Jearld Fenton, NP sent at 03/16/2022  9:21 AM EDT ----- ?Cholesterol is elevated.  I would recommend cholesterol-lowering medication.  If she is agreeable, please send in pravastatin 10 mg daily, #30, 2 refills.  She should follow-up with me in 3 months. ?

## 2022-03-17 NOTE — Telephone Encounter (Signed)
Gastroenterology Pre-Procedure Review ? ?Request Date: 04/19/22 ?Requesting Physician: Dr. Marius Ditch ? ?PATIENT REVIEW QUESTIONS: The patient responded to the following health history questions as indicated:   ? ?1. Are you having any GI issues? yes (diarrhea times 2 -3 months usually in the mornings, sometimes constipation, lower abdomen cramping declined office visit. requested to move forward with screening colonoscopy) ?2. Do you have a personal history of Polyps? no ?3. Do you have a family history of Colon Cancer or Polyps?  unsure ?4. Diabetes Mellitus? yes (recently diagnosed) ?5. Joint replacements in the past 12 months?no ?6. Major health problems in the past 3 months?no ?7. Any artificial heart valves, MVP, or defibrillator?no ?   ?MEDICATIONS & ALLERGIES:    ?Patient reports the following regarding taking any anticoagulation/antiplatelet therapy:   ?Plavix, Coumadin, Eliquis, Xarelto, Lovenox, Pradaxa, Brilinta, or Effient? no ?Aspirin? yes (81 mg daily) ? ?Patient confirms/reports the following medications:  ?Current Outpatient Medications  ?Medication Sig Dispense Refill  ? acetaminophen (TYLENOL) 500 MG tablet Take 500 mg by mouth every 6 (six) hours as needed.    ? albuterol (VENTOLIN HFA) 108 (90 Base) MCG/ACT inhaler SMARTSIG:2 Puff(s) By Mouth Every 6 Hours PRN    ? amLODipine (NORVASC) 5 MG tablet Take 1 tablet (5 mg total) by mouth daily. 90 tablet 3  ? aspirin EC 81 MG tablet Take 81 mg by mouth daily.    ? carvedilol (COREG) 6.25 MG tablet Take 1 tablet (6.25 mg total) by mouth 2 (two) times daily. 180 tablet 3  ? cetirizine (ZYRTEC) 10 MG tablet Take 10 mg by mouth daily.    ? hydrochlorothiazide (HYDRODIURIL) 25 MG tablet Take 1 tablet (25 mg total) by mouth daily. 90 tablet 3  ? ibuprofen (ADVIL) 800 MG tablet Take 1 tablet (800 mg total) by mouth 3 (three) times daily as needed. 90 tablet 0  ? Multiple Vitamins-Minerals (ONE-A-DAY 50 PLUS PO) Take 1 tablet by mouth daily.    ? OZEMPIC, 0.25 OR  0.5 MG/DOSE, 2 MG/1.5ML SOPN Inject 0.25 mg into the skin once a week. For first 4 weeks. Then increase dose to 0.'5mg'$  weekly 1.5 mL 3  ? potassium chloride SA (KLOR-CON M20) 20 MEQ tablet Take 1 tablet (20 mEq total) by mouth daily. 90 tablet 0  ? pravastatin (PRAVACHOL) 10 MG tablet Take 1 tablet (10 mg total) by mouth daily. 30 tablet 2  ? spironolactone (ALDACTONE) 25 MG tablet Take 1 tablet (25 mg total) by mouth daily. 90 tablet 3  ? ?No current facility-administered medications for this visit.  ? ? ?Patient confirms/reports the following allergies:  ?Allergies  ?Allergen Reactions  ? Bactrim [Sulfamethoxazole-Trimethoprim] Hives  ? Penicillins Swelling and Other (See Comments)  ? Sulfa Antibiotics Other (See Comments)  ? Cleocin [Clindamycin Hcl] Rash  ? ? ?No orders of the defined types were placed in this encounter. ? ? ?AUTHORIZATION INFORMATION ?Primary Insurance: ?1D#: ?Group #: ? ?Secondary Insurance: ?1D#: ?Group #: ? ?SCHEDULE INFORMATION: ?Date: 04/19/22 ?Time: ?Location: [facility] ?

## 2022-03-24 ENCOUNTER — Encounter: Payer: Self-pay | Admitting: *Deleted

## 2022-03-24 ENCOUNTER — Encounter: Payer: 59 | Attending: Internal Medicine | Admitting: *Deleted

## 2022-03-24 VITALS — BP 130/86 | Ht 69.0 in | Wt 315.9 lb

## 2022-03-24 DIAGNOSIS — E1165 Type 2 diabetes mellitus with hyperglycemia: Secondary | ICD-10-CM | POA: Insufficient documentation

## 2022-03-24 NOTE — Progress Notes (Signed)
Diabetes Self-Management Education  Visit Type: First/Initial  Appt. Start Time: 0825 Appt. End Time: 5208  03/24/2022  Shannon Bryan, identified by name and date of birth, is a 61 y.o. female with a diagnosis of Diabetes: Type 2.   ASSESSMENT  Blood pressure 130/86, height _0  (1.753 m), weight (!) 315 lb 14.4 oz (143.3 kg). Body mass index is 46.65 kg/m.   Diabetes Self-Management Education - 03/24/22 1006       Visit Information   Visit Type First/Initial      Initial Visit   Diabetes Type Type 2    Date Diagnosed "3 weeks ago"    Are you currently following a meal plan? No    Are you taking your medications as prescribed? Yes      Health Coping   How would you rate your overall health? Fair      Psychosocial Assessment   Patient Belief/Attitude about Diabetes Afraid   "scared down"   What is the hardest part about your diabetes right now, causing you the most concern, or is the most worrisome to you about your diabetes?   Taking/obtaining medications    Self-care barriers None    Self-management support Doctor's office;Family    Patient Concerns Nutrition/Meal planning;Glycemic Control;Weight Control;Healthy Lifestyle    Special Needs None    Preferred Learning Style Auditory;Visual    Learning Readiness Ready    How often do you need to have someone help you when you read instructions, pamphlets, or other written materials from your doctor or pharmacy? 1 - Never    What is the last grade level you completed in school? college      Pre-Education Assessment   Patient understands the diabetes disease and treatment process. Needs Instruction    Patient understands incorporating nutritional management into lifestyle. Needs Instruction    Patient undertands incorporating physical activity into lifestyle. Needs Instruction    Patient understands using medications safely. Needs Instruction    Patient understands monitoring blood glucose, interpreting and using  results Needs Instruction    Patient understands prevention, detection, and treatment of acute complications. Needs Instruction    Patient understands prevention, detection, and treatment of chronic complications. Needs Review    Patient understands how to develop strategies to address psychosocial issues. Needs Instruction    Patient understands how to develop strategies to promote health/change behavior. Needs Instruction      Complications   Last HgB A1C per patient/outside source 9.1 %   02/25/2022   How often do you check your blood sugar? 0 times/day (not testing)   Provided One Touch Verio Flex meter and instructed on use. BG in the office was 259 mg/dL at 9:20 am - 1 1/2 hrs pp.   Have you had a dilated eye exam in the past 12 months? Yes    Have you had a dental exam in the past 12 months? Yes    Are you checking your feet? Yes    How many days per week are you checking your feet? 7      Dietary Intake   Breakfast works 2nd shift and usually skips - sometimes eats oatmeal or Cream of Wheat; today she had egg croissant    Lunch fish sandwich, nuggets, 6 inch sub, mac-n-cheese, 6 wings; salads with lettuce, tomatoes, cucumberds, eggs, chicken or shrimp; eats peas, beans, rice, broccoli, cauliflower, squash, zucchini on week-ends    Dinner same as lunch - likes cottage cheese and fruit; apple sauce, grapes, watermelon  Snack (evening) desserts/sweets    Beverage(s) water, milk, sugar sweetened tea, coffee, juice      Activity / Exercise   Activity / Exercise Type ADL's      Patient Education   Previous Diabetes Education No    Disease Pathophysiology Definition of diabetes, type 1 and 2, and the diagnosis of diabetes;Factors that contribute to the development of diabetes;Explored patient's options for treatment of their diabetes    Healthy Eating Role of diet in the treatment of diabetes and the relationship between the three main macronutrients and blood glucose level;Food label  reading, portion sizes and measuring food.;Reviewed blood glucose goals for pre and post meals and how to evaluate the patients' food intake on their blood glucose level.    Being Active Role of exercise on diabetes management, blood pressure control and cardiac health.    Medications Reviewed patients medication for diabetes, action, purpose, timing of dose and side effects.    Monitoring Taught/evaluated SMBG meter.;Purpose and frequency of SMBG.;Taught/discussed recording of test results and interpretation of SMBG.;Identified appropriate SMBG and/or A1C goals.    Chronic complications Relationship between chronic complications and blood glucose control    Diabetes Stress and Support Identified and addressed patients feelings and concerns about diabetes      Individualized Goals (developed by patient)   Reducing Risk Other (comment)   improve blood sugars, lose weight, lead a healthier lifestyle, become more fit     Outcomes   Expected Outcomes Demonstrated interest in learning. Expect positive outcomes    Future DMSE Other (comment)   3 weeks        Individualized Plan for Diabetes Self-Management Training:   Learning Objective:  Patient will have a greater understanding of diabetes self-management. Patient education plan is to attend individual and/or group sessions per assessed needs and concerns.   Plan:   Patient Instructions  Check blood sugars 1 x day before breakfast or 2 hrs after supper every day Bring blood sugar records to the next class  Call your doctor for a prescription for:  1. Meter strips (type)  One Touch Verio  checking  1   times per day  2. Lancets (type) One Touch Delica Plus checking  1   times per day  Exercise:  Begin walking for    10-15  minutes   3  days a week and gradually increase to 30 minutes 5 x week  Eat 3 meals day,   1-2  snacks a day Space meals 4-6 hours apart Allow 2-3 hours between meals and snacks Don't skip meals - eat at least 1  protein and 1 carbohydrate serving Avoid sugar sweetened drinks (tea, juice) Limit fried foods and desserts/sweets (2-3 x week)  Return for classes on:     Expected Outcomes:  Demonstrated interest in learning. Expect positive outcomes  Education material provided:  General Meal Planning Guidelines Simple Meal Plan Meter - One Touch Verio Flex Carb-Mindful Smoothie Handout  If problems or questions, patient to contact team via:   Johny Drilling, RN, [place] [phone]  Future DSME appointment: (3 weeks) April 15, 2021 for Diabetes Class 1

## 2022-03-24 NOTE — Patient Instructions (Signed)
Check blood sugars 1 x day before breakfast or 2 hrs after supper every day Bring blood sugar records to the next class  Call your doctor for a prescription for:  1. Meter strips (type)  One Touch Verio  checking  1   times per day  2. Lancets (type) One Touch Delica Plus checking  1   times per day  Exercise:  Begin walking for    10-15  minutes   3  days a week and gradually increase to 30 minutes 5 x week  Eat 3 meals day,   1-2  snacks a day Space meals 4-6 hours apart Allow 2-3 hours between meals and snacks Don't skip meals - eat at least 1 protein and 1 carbohydrate serving Avoid sugar sweetened drinks (tea, juice) Limit fried foods and desserts/sweets (2-3 x week)  Return for classes on:

## 2022-04-15 ENCOUNTER — Encounter: Payer: Self-pay | Admitting: *Deleted

## 2022-04-15 ENCOUNTER — Encounter: Payer: 59 | Attending: Internal Medicine | Admitting: *Deleted

## 2022-04-15 VITALS — Wt 309.0 lb

## 2022-04-15 DIAGNOSIS — E1165 Type 2 diabetes mellitus with hyperglycemia: Secondary | ICD-10-CM | POA: Diagnosis present

## 2022-04-15 NOTE — Progress Notes (Signed)
Appt. Start Time: 0900 Appt. End Time: 1130  Class 1 Diabetes Overview - define DM; state own type of DM; identify functions of pancreas and insulin; define insulin deficiency vs insulin resistance  Psychosocial - identify DM as a source of stress; state the effects of stress on BG control  Nutritional Management - describe effects of food on blood glucose; identify sources of carbohydrate, protein and fat; verbalize the importance of balance meals in controlling blood glucose  Exercise - describe the effects of exercise on blood glucose and importance of regular exercise in controlling diabetes; state a plan for personal exercise; verbalize contraindications for exercise  Self-Monitoring - state importance of SMBG; use SMBG results to effectively manage diabetes; identify importance of regular HbA1C testing and goals for results  Acute Complications - recognize hyperglycemia and hypoglycemia with causes and effects; identify blood glucose results as high, low or in control; list steps in treating and preventing high and low blood glucose  Chronic Complications/Foot, Skin, Eye Dental Care - identify possible long-term complications of diabetes (retinopathy, neuropathy, nephropathy, cardiovascular disease, infections); state importance of daily self-foot exams; describe how to examine feet and what to look for; explain appropriate eye and dental care  Lifestyle Changes/Goals & Health/Community Resources - state benefits of making appropriate lifestyle changes; identify habits that need to change (meals, tobacco, alcohol); identify strategies to reduce risk factors for personal health  Pregnancy/Sexual Health - define gestational diabetes; state importance of good blood glucose control and birth control prior to pregnancy; state importance of good blood glucose control in preventing sexual problems (impotence, vaginal dryness, infections, loss of desire); state relationship of blood glucose control  and pregnancy outcome; describe risk of maternal and fetal complications  Teaching Materials Used: Class 1 Slides/Notebook Diabetes Booklet ID Card  Medic Alert/Medic ID Forms Sleep Evaluation Exercise Handout Planning a Balanced Meal Goals for Class 1

## 2022-04-17 ENCOUNTER — Other Ambulatory Visit: Payer: Self-pay | Admitting: Internal Medicine

## 2022-04-19 ENCOUNTER — Ambulatory Visit
Admission: RE | Admit: 2022-04-19 | Discharge: 2022-04-19 | Disposition: A | Discharge status: Home / Self Care | Payer: 59 | Attending: Gastroenterology | Admitting: Gastroenterology

## 2022-04-19 ENCOUNTER — Ambulatory Visit: Payer: 59 | Admitting: Certified Registered Nurse Anesthetist

## 2022-04-19 ENCOUNTER — Encounter
Admission: RE | Disposition: A | Discharge status: Home / Self Care | Payer: Self-pay | Source: Home / Self Care | Attending: Gastroenterology

## 2022-04-19 ENCOUNTER — Encounter: Payer: Self-pay | Admitting: Gastroenterology

## 2022-04-19 DIAGNOSIS — N189 Chronic kidney disease, unspecified: Secondary | ICD-10-CM | POA: Insufficient documentation

## 2022-04-19 DIAGNOSIS — E785 Hyperlipidemia, unspecified: Secondary | ICD-10-CM | POA: Insufficient documentation

## 2022-04-19 DIAGNOSIS — Z1211 Encounter for screening for malignant neoplasm of colon: Secondary | ICD-10-CM | POA: Insufficient documentation

## 2022-04-19 DIAGNOSIS — I129 Hypertensive chronic kidney disease with stage 1 through stage 4 chronic kidney disease, or unspecified chronic kidney disease: Secondary | ICD-10-CM | POA: Diagnosis not present

## 2022-04-19 DIAGNOSIS — K635 Polyp of colon: Secondary | ICD-10-CM

## 2022-04-19 DIAGNOSIS — J45909 Unspecified asthma, uncomplicated: Secondary | ICD-10-CM | POA: Insufficient documentation

## 2022-04-19 DIAGNOSIS — Z87891 Personal history of nicotine dependence: Secondary | ICD-10-CM | POA: Insufficient documentation

## 2022-04-19 DIAGNOSIS — D125 Benign neoplasm of sigmoid colon: Secondary | ICD-10-CM | POA: Diagnosis not present

## 2022-04-19 DIAGNOSIS — E1122 Type 2 diabetes mellitus with diabetic chronic kidney disease: Secondary | ICD-10-CM | POA: Insufficient documentation

## 2022-04-19 HISTORY — DX: Sleep apnea, unspecified: G47.30

## 2022-04-19 HISTORY — PX: COLONOSCOPY WITH PROPOFOL: SHX5780

## 2022-04-19 LAB — GLUCOSE, CAPILLARY: Glucose-Capillary: 207 mg/dL — ABNORMAL HIGH (ref 70–99)

## 2022-04-19 SURGERY — COLONOSCOPY WITH PROPOFOL
Anesthesia: General

## 2022-04-19 MED ORDER — PROPOFOL 10 MG/ML IV BOLUS
INTRAVENOUS | Status: DC | PRN
  Administered 2022-04-19: 80 mg via INTRAVENOUS

## 2022-04-19 MED ORDER — SODIUM CHLORIDE 0.9 % IV SOLN: INTRAVENOUS | Status: DC

## 2022-04-19 MED ORDER — GLYCOPYRROLATE 0.2 MG/ML IJ SOLN
INTRAMUSCULAR | Status: DC | PRN
  Administered 2022-04-19: .2 mg via INTRAVENOUS

## 2022-04-19 MED ORDER — PROPOFOL 500 MG/50ML IV EMUL
INTRAVENOUS | Status: DC | PRN
  Administered 2022-04-19: 160 ug/kg/min via INTRAVENOUS

## 2022-04-19 NOTE — Telephone Encounter (Signed)
Requested Prescriptions  Pending Prescriptions Disp Refills  . ibuprofen (ADVIL) 800 MG tablet [Pharmacy Med Name: Ibuprofen 800 MG Oral Tablet] 90 tablet 0    Sig: Take 1 tablet by mouth three times daily as needed     Analgesics:  NSAIDS Failed - 04/17/2022  6:02 PM      Failed - Manual Review: Labs are only required if the patient has taken medication for more than 8 weeks.      Passed - Cr in normal range and within 360 days    Creat  Date Value Ref Range Status  01/05/2019 0.92 0.50 - 1.05 mg/dL Final    Comment:    For patients >52 years of age, the reference limit for Creatinine is approximately 13% higher for people identified as African-American. .    Creatinine, Ser  Date Value Ref Range Status  02/25/2022 0.87 0.44 - 1.00 mg/dL Final   Creatinine, Urine  Date Value Ref Range Status  03/15/2022 191 20 - 275 mg/dL Final         Passed - HGB in normal range and within 360 days    Hemoglobin  Date Value Ref Range Status  02/25/2022 13.9 12.0 - 15.0 g/dL Final         Passed - PLT in normal range and within 360 days    Platelets  Date Value Ref Range Status  02/25/2022 266 150 - 400 K/uL Final         Passed - HCT in normal range and within 360 days    HCT  Date Value Ref Range Status  02/25/2022 43.0 36.0 - 46.0 % Final         Passed - eGFR is 30 or above and within 360 days    GFR, Est African American  Date Value Ref Range Status  01/05/2019 81 > OR = 60 mL/min/1.85m Final   GFR calc Af Amer  Date Value Ref Range Status  06/20/2020 >60 >60 mL/min Final   GFR, Est Non African American  Date Value Ref Range Status  01/05/2019 70 > OR = 60 mL/min/1.765mFinal   GFR, Estimated  Date Value Ref Range Status  02/25/2022 >60 >60 mL/min Final    Comment:    (NOTE) Calculated using the CKD-EPI Creatinine Equation (2021)    eGFR  Date Value Ref Range Status  06/11/2021 77 >59 mL/min/1.73 Final         Passed - Patient is not pregnant       Passed - Valid encounter within last 12 months    Recent Outpatient Visits          1 month ago Aortic atherosclerosis ([place]  [facility]   2 years ago Large breasts   [place]   3 years ago Chest pain, exertional   [facility]   3 years ago Irritant contact dermatitis, unspecified trigger   [facility]   4 years ago Influenza A   [facility]      Future Appointments            In 2 months Baity, [facility]

## 2022-04-19 NOTE — H&P (Signed)
Cephas Darby, MD [address]  [place]  [place], [place] [address]  Main: [phone]  Fax: [phone] Pager: [phone]  Primary Care Physician:  Jearld Fenton, NP Primary Gastroenterologist:  Dr. Cephas Darby  Pre-Procedure History & Physical: HPI:  Shannon Bryan is a 61 y.o. female is here for an colonoscopy.   Past Medical History:  Diagnosis Date   Asthma    Blood clot in vein    Chronic kidney disease    Diabetes mellitus without complication ([place] [facility])    Hyperlipidemia    Hypertension    Kidney stone    Sleep apnea     Past Surgical History:  Procedure Laterality Date   ABDOMINAL HYSTERECTOMY     ABLATION     BREAST RECONSTRUCTION WITH MASTOPLASTY Bilateral 03/11/2020   Procedure: BREAST RECONSTRUCTION WITH MASTOPLASTY;  Surgeon: Youlanda Roys, MD;  Location: [place];  Service: Plastics;  Laterality: Bilateral;   CHOLECYSTECTOMY     ROTATOR CUFF REPAIR Right    TUBAL LIGATION      Prior to Admission medications   Medication Sig Start Date End Date Taking? Authorizing Provider  amLODipine (NORVASC) 5 MG tablet Take 1 tablet (5 mg total) by mouth daily. 05/05/21 05/26/23 Yes Wellington Hampshire, MD  aspirin EC 81 MG tablet Take 81 mg by mouth daily.   Yes [provider]  carvedilol (COREG) 6.25 MG tablet Take 1 tablet (6.25 mg total) by mouth 2 (two) times daily. 05/05/21  Yes Wellington Hampshire, MD  cetirizine (ZYRTEC) 10 MG tablet Take 10 mg by mouth daily.   Yes [provider]  hydrochlorothiazide (HYDRODIURIL) 25 MG tablet Take 1 tablet (25 mg total) by mouth daily. 05/05/21  Yes Wellington Hampshire, MD  potassium chloride SA (KLOR-CON M20) 20 MEQ tablet Take 1 tablet (20 mEq total) by mouth daily. 02/25/22 05/26/22 Yes Wellington Hampshire, MD  pravastatin (PRAVACHOL) 10 MG tablet Take 1 tablet (10 mg total) by mouth daily. 03/17/22  Yes Jearld Fenton, NP  spironolactone (ALDACTONE) 25 MG tablet Take 1 tablet (25  mg total) by mouth daily. 05/05/21  Yes Wellington Hampshire, MD  acetaminophen (TYLENOL) 500 MG tablet Take 500 mg by mouth every 6 (six) hours as needed.    [provider]  albuterol (VENTOLIN HFA) 108 (90 Base) MCG/ACT inhaler SMARTSIG:2 Puff(s) By Mouth Every 6 Hours PRN 10/21/21   [provider]  ibuprofen (ADVIL) 800 MG tablet Take 1 tablet (800 mg total) by mouth 3 (three) times daily as needed. 03/15/22   Jearld Fenton, NP  Multiple Vitamins-Minerals (ONE-A-DAY 50 PLUS PO) Take 1 tablet by mouth daily.    [provider]  OZEMPIC, 0.25 OR 0.5 MG/DOSE, 2 MG/1.5ML SOPN Inject 0.25 mg into the skin once a week. For first 4 weeks. Then increase dose to 0.'5mg'$  weekly Patient not taking: Reported on 03/24/2022 03/15/22   Jearld Fenton, NP    Allergies as of 03/17/2022 - Review Complete 03/15/2022  Allergen Reaction Noted   Bactrim [sulfamethoxazole-trimethoprim] Hives 10/07/2015   Penicillins Swelling and Other (See Comments) 10/07/2015   Sulfa antibiotics Other (See Comments) 07/22/2020   Cleocin [clindamycin hcl] Rash 12/01/2015    Family History  Problem Relation Age of Onset   Heart disease Mother    Stroke Mother    Diabetes Mother    Cancer Mother        bone   Depression Mother    Diabetes Father  Emphysema Father     Social History   Socioeconomic History   Marital status: Single    Spouse name: Not on file   Number of children: Not on file   Years of education: Not on file   Highest education level: Not on file  Occupational History   Not on file  Tobacco Use   Smoking status: Former    Packs/day: 0.25    Years: 30.00    Total pack years: 7.50    Types: Cigarettes    Quit date: 02/03/2020    Years since quitting: 2.2   Smokeless tobacco: Former   Tobacco comments:    1/4 - 1/2 ppd had quit for 1 year in 2010  Vaping Use   Vaping Use: Some days   Devices: Vaping  Substance and Sexual Activity   Alcohol use: Yes    Alcohol/week:  1.0 standard drink of alcohol    Types: 1 Glasses of wine per week    Comment: occasional   Drug use: No   Sexual activity: Never  Other Topics Concern   Not on file  Social History Narrative   Not on file   Social Determinants of Health   Financial Resource Strain: Not on file  Food Insecurity: Not on file  Transportation Needs: Not on file  Physical Activity: Not on file  Stress: Not on file  Social Connections: Not on file  Intimate Partner Violence: Not on file    Review of Systems: See HPI, otherwise negative ROS  Physical Exam: BP (!) 136/92   Pulse 71   Temp (!) 96.8 F (36 C) (Temporal)   Resp 18   Ht '5\' 9"'$  (1.753 m)   Wt 136.1 kg   SpO2 100%   BMI 44.30 kg/m  General:   Alert,  pleasant and cooperative in NAD Head:  Normocephalic and atraumatic. Neck:  Supple; no masses or thyromegaly. Lungs:  Clear throughout to auscultation.    Heart:  Regular rate and rhythm. Abdomen:  Soft, nontender and nondistended. Normal bowel sounds, without guarding, and without rebound.   Neurologic:  Alert and  oriented x4;  grossly normal neurologically.  Impression/Plan: Shannon Bryan is here for an colonoscopy to be performed for colon cancer screening  Risks, benefits, limitations, and alternatives regarding  colonoscopy have been reviewed with the patient.  Questions have been answered.  All parties agreeable.   Sherri Sear, MD  04/19/2022, 11:17 AM

## 2022-04-19 NOTE — Op Note (Signed)
[facility] Patient Name: Shannon Bryan Procedure Date: 04/19/2022 11:57 AM MRN: [id number] Account #: [id number] Date of Birth: 1961/10/15 Admit Type: Outpatient Age: 61 Room: Shannon Bryan ENDO ROOM 4 Gender: Female Note Status: Finalized Instrument Name: Shannon Bryan [id number] Procedure:             Colonoscopy Indications:           Screening for colorectal malignant neoplasm, This is                         the patient's first colonoscopy Providers:             Lin Landsman MD, MD Referring MD:          Ria Bush (Referring MD) Medicines:             General Anesthesia Complications:         No immediate complications. Estimated blood loss: None. Procedure:             Pre-Anesthesia Assessment:                        - Prior to the procedure, a History and Physical was                         performed, and patient medications and allergies were                         reviewed. The patient is competent. The risks and                         benefits of the procedure and the sedation options and                         risks were discussed with the patient. All questions                         were answered and informed consent was obtained.                         Patient identification and proposed procedure were                         verified by the physician, the nurse, the                         anesthesiologist, the anesthetist and the technician                         in the pre-procedure area in the procedure room in the                         endoscopy suite. Mental Status Examination: alert and                         oriented. Airway Examination: normal oropharyngeal                         airway and neck mobility. Respiratory Examination:  clear to auscultation. CV Examination: normal.                         Prophylactic Antibiotics: The patient does not require                          prophylactic antibiotics. Prior Anticoagulants: The                         patient has taken no previous anticoagulant or                         antiplatelet agents. ASA Grade Assessment: III - A                         patient with severe systemic disease. After reviewing                         the risks and benefits, the patient was deemed in                         satisfactory condition to undergo the procedure. The                         anesthesia plan was to use general anesthesia.                         Immediately prior to administration of medications,                         the patient was re-assessed for adequacy to receive                         sedatives. The heart rate, respiratory rate, oxygen                         saturations, blood pressure, adequacy of pulmonary                         ventilation, and response to care were monitored                         throughout the procedure. The physical status of the                         patient was re-assessed after the procedure.                        After obtaining informed consent, the colonoscope was                         passed under direct vision. Throughout the procedure,                         the patient's blood pressure, pulse, and oxygen                         saturations were monitored continuously. The  Colonoscope was introduced through the anus and                         advanced to the the cecum, identified by appendiceal                         orifice and ileocecal valve. The colonoscopy was                         performed with moderate difficulty due to significant                         looping and the patient's body habitus. Successful                         completion of the procedure was aided by applying                         abdominal pressure. The patient tolerated the                         procedure well. The quality of the bowel preparation                          was not adequate to identify polyps 6 mm and larger in                         size. Findings:      The perianal and digital rectal examinations were normal. Pertinent       negatives include normal sphincter tone and no palpable rectal lesions.      A 4 mm polyp was found in the sigmoid colon. The polyp was sessile. The       polyp was removed with a cold snare. Resection and retrieval were       complete.      The retroflexed view of the distal rectum and anal verge was normal and       showed no anal or rectal abnormalities. Impression:            - Preparation of the colon was inadequate.                        - One 4 mm polyp in the sigmoid colon, removed with a                         cold snare. Resected and retrieved.                        - The distal rectum and anal verge are normal on                         retroflexion view. Recommendation:        - Discharge patient to home (with escort).                        - Resume previous diet today.                        -  Continue present medications.                        - Await pathology results.                        - Repeat colonoscopy in 7-10 years for surveillance                         based on pathology results. Procedure Code(s):     --- Professional ---                        [phone], Colonoscopy, flexible; with removal of                         tumor(s), polyp(s), or other lesion(s) by snare                         technique Diagnosis Code(s):     --- Professional ---                        Z12.11, Encounter for screening for malignant neoplasm                         of colon                        K63.5, Polyp of colon CPT copyright 2019 American Medical Association. All rights reserved. The codes documented in this report are preliminary and upon coder review may  be revised to meet current compliance requirements. Dr. Ulyess Mort Lin Landsman MD, MD 04/19/2022 12:31:40 PM This report  has been signed electronically. Number of Addenda: 0 Note Initiated On: 04/19/2022 11:57 AM Scope Withdrawal Time: 0 hours 12 minutes 38 seconds  Total Procedure Duration: 0 hours 18 minutes 30 seconds  Estimated Blood Loss:  Estimated blood loss: none.      [facility]

## 2022-04-19 NOTE — Anesthesia Procedure Notes (Signed)
Date/Time: 04/19/2022 12:11 PM  Performed by: Demetrius Charity, CRNAPre-anesthesia Checklist: Patient identified, Emergency Drugs available, Suction available, Patient being monitored and Timeout performed Patient Re-evaluated:Patient Re-evaluated prior to induction Oxygen Delivery Method: Nasal cannula Induction Type: IV induction Placement Confirmation: CO2 detector and positive ETCO2

## 2022-04-19 NOTE — Transfer of Care (Signed)
Immediate Anesthesia Transfer of Care Note  Patient: Shannon Bryan  Procedure(s) Performed: COLONOSCOPY WITH PROPOFOL  Patient Location: Endoscopy Unit  Anesthesia Type:General  Level of Consciousness: drowsy  Airway & Oxygen Therapy: Patient Spontanous Breathing  Post-op Assessment: Report given to RN and Post -op Vital signs reviewed and stable  Post vital signs: Reviewed and stable  Last Vitals: see Epic record Vitals Value Taken Time  BP    Temp    Pulse    Resp    SpO2      Last Pain:  Vitals:   04/19/22 1104  TempSrc: Temporal  PainSc: 0-No pain         Complications: No notable events documented.

## 2022-04-19 NOTE — Anesthesia Preprocedure Evaluation (Signed)
Anesthesia Evaluation  Patient identified by MRN, date of birth, ID band Patient awake    Reviewed: Allergy & Precautions, H&P , NPO status , Patient's Chart, lab work & pertinent test results, reviewed documented beta blocker date and time   Airway Mallampati: II   Neck ROM: full    Dental  (+) Poor Dentition   Pulmonary asthma , former smoker,    Pulmonary exam normal        Cardiovascular Exercise Tolerance: Poor hypertension, On Medications negative cardio ROS Normal cardiovascular exam Rhythm:regular Rate:Normal     Neuro/Psych negative neurological ROS  negative psych ROS   GI/Hepatic negative GI ROS, Neg liver ROS,   Endo/Other  negative endocrine ROSdiabetes  Renal/GU Renal disease  negative genitourinary   Musculoskeletal   Abdominal   Peds  Hematology negative hematology ROS (+)   Anesthesia Other Findings Past Medical History: No date: Asthma No date: Blood clot in vein No date: Chronic kidney disease No date: Diabetes mellitus without complication (HCC) No date: Hyperlipidemia No date: Hypertension No date: Kidney stone Past Surgical History: No date: ABDOMINAL HYSTERECTOMY No date: ABLATION 03/11/2020: BREAST RECONSTRUCTION WITH MASTOPLASTY; Bilateral     Comment:  Procedure: BREAST RECONSTRUCTION WITH MASTOPLASTY;                Surgeon: Contogiannis, Audrea Muscat, MD;  Location: [facility];  Service: Plastics;  Laterality:               Bilateral; No date: CHOLECYSTECTOMY No date: ROTATOR CUFF REPAIR; Right No date: TUBAL LIGATION   Reproductive/Obstetrics negative OB ROS                             Anesthesia Physical Anesthesia Plan  ASA: 3  Anesthesia Plan: General   Post-op Pain Management:    Induction:   PONV Risk Score and Plan:   Airway Management Planned:   Additional Equipment:   Intra-op Plan:   Post-operative  Plan:   Informed Consent: I have reviewed the patients History and Physical, chart, labs and discussed the procedure including the risks, benefits and alternatives for the proposed anesthesia with the patient or authorized representative who has indicated his/her understanding and acceptance.     Dental Advisory Given  Plan Discussed with: CRNA  Anesthesia Plan Comments:         Anesthesia Quick Evaluation

## 2022-04-20 ENCOUNTER — Encounter: Payer: Self-pay | Admitting: Gastroenterology

## 2022-04-20 LAB — SURGICAL PATHOLOGY

## 2022-04-20 NOTE — Anesthesia Postprocedure Evaluation (Signed)
Anesthesia Post Note  Patient: Shannon Bryan  Procedure(s) Performed: COLONOSCOPY WITH PROPOFOL  Patient location during evaluation: PACU Anesthesia Type: General Level of consciousness: awake and alert Pain management: pain level controlled Vital Signs Assessment: post-procedure vital signs reviewed and stable Respiratory status: spontaneous breathing, nonlabored ventilation, respiratory function stable and patient connected to nasal cannula oxygen Cardiovascular status: blood pressure returned to baseline and stable Postop Assessment: no apparent nausea or vomiting Anesthetic complications: no   No notable events documented.   Last Vitals:  Vitals:   04/19/22 1245 04/19/22 1255  BP: 133/79 138/75  Pulse: 62 (!) 59  Resp: (!) 21 (!) 21  Temp:    SpO2: 99% 99%    Last Pain:  Vitals:   04/20/22 0732  TempSrc:   PainSc: 0-No pain                 Molli Barrows

## 2022-04-21 ENCOUNTER — Encounter: Payer: Self-pay | Admitting: Gastroenterology

## 2022-04-22 ENCOUNTER — Encounter: Payer: Self-pay | Admitting: *Deleted

## 2022-04-22 ENCOUNTER — Encounter: Payer: 59 | Admitting: *Deleted

## 2022-04-22 VITALS — Wt 313.9 lb

## 2022-04-22 DIAGNOSIS — E1165 Type 2 diabetes mellitus with hyperglycemia: Secondary | ICD-10-CM | POA: Diagnosis not present

## 2022-04-22 NOTE — Progress Notes (Signed)

## 2022-04-29 ENCOUNTER — Encounter: Payer: Self-pay | Admitting: *Deleted

## 2022-04-29 ENCOUNTER — Encounter: Payer: 59 | Admitting: *Deleted

## 2022-04-29 VITALS — BP 142/80 | Wt 308.8 lb

## 2022-04-29 DIAGNOSIS — E1165 Type 2 diabetes mellitus with hyperglycemia: Secondary | ICD-10-CM

## 2022-04-29 NOTE — Progress Notes (Signed)

## 2022-06-22 ENCOUNTER — Ambulatory Visit: Payer: 59 | Admitting: Internal Medicine

## 2022-06-22 NOTE — Progress Notes (Deleted)
Subjective:    Patient ID: Shannon Bryan, female    DOB: [birth year], 61 y.o.   MRN: [id number]  HPI  Patient presents to clinic today for 16-monthfollow-up of chronic conditions.  HTN: Her BP today is.  She is taking Amlodipine, Carvedilol, HCTZ, Spironolactone and Potassium as prescribed.  ECG from 01/2022 reviewed.  DM2: Her last A1c was 9.1%, 01/2022.  She is not taking any oral diabetic medication at this time.  She does not check her sugars.  She checks her feet routinely.  Her last eye exam was.  Flu.  Pneumovax.  COVID.  HLD with Aortic Atherosclerosis: Her last LDL was 136, triglycerides 121, 03/2022.  She denies myalgias on Pravastatin.  She is taking Aspirin as well.  She does not consume a low-fat diet.  OSA: She averages 8 hours of sleep per night with the use of her CPAP.  Sleep study from 07/2021 reviewed.  Review of Systems     Past Medical History:  Diagnosis Date   Asthma    Blood clot in vein    Chronic kidney disease    Diabetes mellitus without complication (HCC)    Hyperlipidemia    Hypertension    Kidney stone    Sleep apnea     Current Outpatient Medications  Medication Sig Dispense Refill   acetaminophen (TYLENOL) 500 MG tablet Take 500 mg by mouth every 6 (six) hours as needed.     albuterol (VENTOLIN HFA) 108 (90 Base) MCG/ACT inhaler SMARTSIG:2 Puff(s) By Mouth Every 6 Hours PRN     amLODipine (NORVASC) 5 MG tablet Take 1 tablet (5 mg total) by mouth daily. 90 tablet 3   aspirin EC 81 MG tablet Take 81 mg by mouth daily.     carvedilol (COREG) 6.25 MG tablet Take 1 tablet (6.25 mg total) by mouth 2 (two) times daily. 180 tablet 3   cetirizine (ZYRTEC) 10 MG tablet Take 10 mg by mouth daily.     hydrochlorothiazide (HYDRODIURIL) 25 MG tablet Take 1 tablet (25 mg total) by mouth daily. 90 tablet 3   ibuprofen (ADVIL) 800 MG tablet Take 1 tablet by mouth three times daily as needed 90 tablet 0   Multiple Vitamins-Minerals (ONE-A-DAY 50 PLUS PO) Take 1  tablet by mouth daily.     potassium chloride SA (KLOR-CON M20) 20 MEQ tablet Take 1 tablet (20 mEq total) by mouth daily. 90 tablet 0   pravastatin (PRAVACHOL) 10 MG tablet Take 1 tablet (10 mg total) by mouth daily. 30 tablet 2   spironolactone (ALDACTONE) 25 MG tablet Take 1 tablet (25 mg total) by mouth daily. 90 tablet 3   No current facility-administered medications for this visit.    Allergies  Allergen Reactions   Bactrim [Sulfamethoxazole-Trimethoprim] Hives   Penicillins Swelling and Other (See Comments)   Sulfa Antibiotics Other (See Comments)   Cleocin [Clindamycin Hcl] Rash    Family History  Problem Relation Age of Onset   Heart disease Mother    Stroke Mother    Diabetes Mother    Cancer Mother        bone   Depression Mother    Diabetes Father    Emphysema Father     Social History   Socioeconomic History   Marital status: Single    Spouse name: Not on file   Number of children: Not on file   Years of education: Not on file   Highest education level: Not on file  Occupational History  Not on file  Tobacco Use   Smoking status: Former    Packs/day: 0.25    Years: 30.00    Total pack years: 7.50    Types: Cigarettes    Quit date: 02/03/2020    Years since quitting: 2.3   Smokeless tobacco: Former   Tobacco comments:    1/4 - 1/2 ppd had quit for 1 year in 2010  Vaping Use   Vaping Use: Some days   Devices: Vaping  Substance and Sexual Activity   Alcohol use: Yes    Alcohol/week: 1.0 standard drink of alcohol    Types: 1 Glasses of wine per week    Comment: occasional   Drug use: No   Sexual activity: Never  Other Topics Concern   Not on file  Social History Narrative   Not on file   Social Determinants of Health   Financial Resource Strain: Not on file  Food Insecurity: Not on file  Transportation Needs: Not on file  Physical Activity: Not on file  Stress: Not on file  Social Connections: Not on file  Intimate Partner Violence: Not  on file     Constitutional: Denies fever, malaise, fatigue, headache or abrupt weight changes.  HEENT: Denies eye pain, eye redness, ear pain, ringing in the ears, wax buildup, runny nose, nasal congestion, bloody nose, or sore throat. Respiratory: Denies difficulty breathing, shortness of breath, cough or sputum production.   Cardiovascular: Denies chest pain, chest tightness, palpitations or swelling in the hands or feet.  Gastrointestinal: Denies abdominal pain, bloating, constipation, diarrhea or blood in the stool.  GU: Denies urgency, frequency, pain with urination, burning sensation, blood in urine, odor or discharge. Musculoskeletal: Denies decrease in range of motion, difficulty with gait, muscle pain or joint pain and swelling.  Skin: Denies redness, rashes, lesions or ulcercations.  Neurological: Denies dizziness, difficulty with memory, difficulty with speech or problems with balance and coordination.  Psych: Denies anxiety, depression, SI/HI.  No other specific complaints in a complete review of systems (except as listed in HPI above).  Objective:   Physical Exam   There were no vitals taken for this visit. Wt Readings from Last 3 Encounters:  04/29/22 (!) 308 lb 12.8 oz (140.1 kg)  04/22/22 (!) 313 lb 14.4 oz (142.4 kg)  04/19/22 300 lb (136.1 kg)    General: Appears their stated age, well developed, well nourished in NAD. Skin: Warm, dry and intact. No rashes, lesions or ulcerations noted. HEENT: Head: normal shape and size; Eyes: sclera white, no icterus, conjunctiva pink, PERRLA and EOMs intact; Ears: Tm's gray and intact, normal light reflex; Nose: mucosa pink and moist, septum midline; Throat/Mouth: Teeth present, mucosa pink and moist, no exudate, lesions or ulcerations noted.  Neck:  Neck supple, trachea midline. No masses, lumps or thyromegaly present.  Cardiovascular: Normal rate and rhythm. S1,S2 noted.  No murmur, rubs or gallops noted. No JVD or BLE edema. No  carotid bruits noted. Pulmonary/Chest: Normal effort and positive vesicular breath sounds. No respiratory distress. No wheezes, rales or ronchi noted.  Abdomen: Soft and nontender. Normal bowel sounds. No distention or masses noted. Liver, spleen and kidneys non palpable. Musculoskeletal: Normal range of motion. No signs of joint swelling. No difficulty with gait.  Neurological: Alert and oriented. Cranial nerves II-XII grossly intact. Coordination normal.  Psychiatric: Mood and affect normal. Behavior is normal. Judgment and thought content normal.     BMET    Component Value Date/Time   NA 136 02/25/2022 1725  NA 141 06/11/2021 0927   K 3.5 02/25/2022 1725   CL 101 02/25/2022 1725   CO2 25 02/25/2022 1725   GLUCOSE 283 (H) 02/25/2022 1725   BUN 11 02/25/2022 1725   BUN 12 06/11/2021 0927   CREATININE 0.87 02/25/2022 1725   CREATININE 0.92 01/05/2019 1101   CALCIUM 9.1 02/25/2022 1725   GFRNONAA >60 02/25/2022 1725   GFRNONAA 70 01/05/2019 1101   GFRAA >60 06/20/2020 1936   GFRAA 81 01/05/2019 1101    Lipid Panel     Component Value Date/Time   CHOL 197 03/15/2022 0948   TRIG 121 03/15/2022 0948   HDL 38 (L) 03/15/2022 0948   CHOLHDL 5.2 (H) 03/15/2022 0948   VLDL 20 03/29/2017 0833   LDLCALC 136 (H) 03/15/2022 0948    CBC    Component Value Date/Time   WBC 7.3 02/25/2022 1725   RBC 5.20 (H) 02/25/2022 1725   HGB 13.9 02/25/2022 1725   HCT 43.0 02/25/2022 1725   PLT 266 02/25/2022 1725   MCV 82.7 02/25/2022 1725   MCH 26.7 02/25/2022 1725   MCHC 32.3 02/25/2022 1725   RDW 14.6 02/25/2022 1725   LYMPHSABS 2.7 02/25/2022 1725   MONOABS 0.3 02/25/2022 1725   EOSABS 0.1 02/25/2022 1725   BASOSABS 0.1 02/25/2022 1725    Hgb A1C Lab Results  Component Value Date   HGBA1C 9.1 (H) 02/25/2022           Assessment & Plan:    RTC in 3 months for your annual exam Webb Silversmith, NP

## 2023-01-18 ENCOUNTER — Other Ambulatory Visit: Payer: Self-pay

## 2023-01-18 ENCOUNTER — Emergency Department
Admission: EM | Admit: 2023-01-18 | Discharge: 2023-01-18 | Disposition: A | Discharge status: Home / Self Care | Payer: Self-pay | Attending: Emergency Medicine | Admitting: Emergency Medicine

## 2023-01-18 ENCOUNTER — Encounter: Payer: Self-pay | Admitting: Intensive Care

## 2023-01-18 ENCOUNTER — Emergency Department: Payer: Self-pay

## 2023-01-18 DIAGNOSIS — J40 Bronchitis, not specified as acute or chronic: Secondary | ICD-10-CM | POA: Insufficient documentation

## 2023-01-18 DIAGNOSIS — J329 Chronic sinusitis, unspecified: Secondary | ICD-10-CM | POA: Insufficient documentation

## 2023-01-18 DIAGNOSIS — R7989 Other specified abnormal findings of blood chemistry: Secondary | ICD-10-CM

## 2023-01-18 DIAGNOSIS — E119 Type 2 diabetes mellitus without complications: Secondary | ICD-10-CM | POA: Insufficient documentation

## 2023-01-18 DIAGNOSIS — R0602 Shortness of breath: Secondary | ICD-10-CM | POA: Insufficient documentation

## 2023-01-18 DIAGNOSIS — Z1152 Encounter for screening for COVID-19: Secondary | ICD-10-CM | POA: Insufficient documentation

## 2023-01-18 DIAGNOSIS — I10 Essential (primary) hypertension: Secondary | ICD-10-CM | POA: Insufficient documentation

## 2023-01-18 LAB — COMPREHENSIVE METABOLIC PANEL
ALT: 21 U/L (ref 0–44)
AST: 20 U/L (ref 15–41)
Albumin: 3.9 g/dL (ref 3.5–5.0)
Alkaline Phosphatase: 88 U/L (ref 38–126)
Anion gap: 10 (ref 5–15)
BUN: 7 mg/dL (ref 6–20)
CO2: 25 mmol/L (ref 22–32)
Calcium: 9.1 mg/dL (ref 8.9–10.3)
Chloride: 105 mmol/L (ref 98–111)
Creatinine, Ser: 0.71 mg/dL (ref 0.44–1.00)
GFR, Estimated: >60 mL/min (ref >60)
Glucose, Bld: 179 mg/dL — ABNORMAL HIGH (ref 70–99)
Potassium: 3.3 mmol/L — ABNORMAL LOW (ref 3.5–5.1)
Sodium: 140 mmol/L (ref 135–145)
Total Bilirubin: 0.5 mg/dL (ref 0.3–1.2)
Total Protein: 8 g/dL (ref 6.5–8.1)

## 2023-01-18 LAB — CBC WITH DIFFERENTIAL/PLATELET
Abs Immature Granulocytes: 0.03 10*3/uL (ref 0.00–0.07)
Basophils Absolute: 0 10*3/uL (ref 0.0–0.1)
Basophils Relative: 0 %
Eosinophils Absolute: 0.1 10*3/uL (ref 0.0–0.5)
Eosinophils Relative: 1 %
HCT: 43.3 % (ref 36.0–46.0)
Hemoglobin: 13.8 g/dL (ref 12.0–15.0)
Immature Granulocytes: 0 %
Lymphocytes Relative: 27 %
Lymphs Abs: 2.8 10*3/uL (ref 0.7–4.0)
MCH: 26.7 pg (ref 26.0–34.0)
MCHC: 31.9 g/dL (ref 30.0–36.0)
MCV: 83.8 fL (ref 80.0–100.0)
Monocytes Absolute: 0.7 10*3/uL (ref 0.1–1.0)
Monocytes Relative: 6 %
Neutro Abs: 6.9 10*3/uL (ref 1.7–7.7)
Neutrophils Relative %: 66 %
Platelets: 287 10*3/uL (ref 150–400)
RBC: 5.17 MIL/uL — ABNORMAL HIGH (ref 3.87–5.11)
RDW: 15.2 % (ref 11.5–15.5)
WBC: 10.6 10*3/uL — ABNORMAL HIGH (ref 4.0–10.5)
nRBC: 0 % (ref 0.0–0.2)

## 2023-01-18 LAB — RESP PANEL BY RT-PCR (RSV, FLU A&B, COVID)  RVPGX2
Influenza A by PCR: NEGATIVE
Influenza B by PCR: NEGATIVE
Resp Syncytial Virus by PCR: NEGATIVE
SARS Coronavirus 2 by RT PCR: NEGATIVE

## 2023-01-18 LAB — GROUP A STREP BY PCR: Group A Strep by PCR: NOT DETECTED

## 2023-01-18 LAB — BRAIN NATRIURETIC PEPTIDE: B Natriuretic Peptide: 127.2 pg/mL — ABNORMAL HIGH (ref 0.0–100.0)

## 2023-01-18 LAB — TROPONIN I (HIGH SENSITIVITY): Troponin I (High Sensitivity): 11 ng/L (ref <18)

## 2023-01-18 MED ORDER — CEFDINIR 300 MG PO CAPS: 300.0000 mg | ORAL_CAPSULE | Freq: Two times a day (BID) | ORAL | 0 refills | Status: AC

## 2023-01-18 MED ORDER — ACETAMINOPHEN 500 MG PO TABS
1000.0000 mg | ORAL_TABLET | Freq: Once | ORAL | Status: AC
  Administered 2023-01-18: 1000 mg via ORAL
  Filled 2023-01-18: qty 2

## 2023-01-18 MED ORDER — BENZONATATE 100 MG PO CAPS: 100.0000 mg | ORAL_CAPSULE | Freq: Three times a day (TID) | ORAL | 0 refills | Status: DC | PRN

## 2023-01-18 MED ORDER — POTASSIUM CHLORIDE CRYS ER 20 MEQ PO TBCR
40.0000 meq | EXTENDED_RELEASE_TABLET | Freq: Once | ORAL | Status: AC
  Administered 2023-01-18: 40 meq via ORAL
  Filled 2023-01-18: qty 2

## 2023-01-18 MED ORDER — KETOROLAC TROMETHAMINE 30 MG/ML IJ SOLN
30.0000 mg | Freq: Once | INTRAMUSCULAR | Status: AC
  Administered 2023-01-18: 30 mg via INTRAMUSCULAR
  Filled 2023-01-18: qty 1

## 2023-01-18 MED ORDER — DOXYCYCLINE MONOHYDRATE 100 MG PO TABS: 100.0000 mg | ORAL_TABLET | Freq: Two times a day (BID) | ORAL | 0 refills | Status: AC

## 2023-01-18 NOTE — Discharge Instructions (Signed)
Take antibiotics to help with any infection could be in your lungs or your sinuses, take cough drops to help with the coughing.  Take Tylenol 1 g every 8 hours, ibuprofen 600 every 6-8 hours with food to help with pain and return to the ER if you develop worsening symptoms or any other concerns

## 2023-01-18 NOTE — ED Triage Notes (Signed)
Patient c/o cough, sore throat, and congestion. Reports chest discomfort when she coughs. C/o green mucous

## 2023-01-18 NOTE — ED Provider Notes (Signed)
[facility] Provider Note    Event Date/Time   First MD Initiated Contact with Patient 01/18/23 [id number]     (approximate)   History   Cough and Sore Throat   HPI  Shannon Bryan is a 62 y.o. female  with diabetes,HTN, HLD who comes in with cough, sore throat, and congestion.  Patient reports having some cough sore throat congestion over the past 1 week.  She reports now having green mucus coming up through her nose.  She denies any chest pain but does report some shortness of breath with laying flat.  She reports intermittent swelling of her left leg.  Denies any issues with her right leg.  Does report history of DVT in the setting of pregnancy over 30 years ago no prior history of DVT after that.  She does report a prior injury to her left leg so she is not sure if it is just from being on her feet from work.  She denies any abdominal pain.  She reports decreased appetite secondary to bitter taste in her mouth.  She reports a history of bronchitis previously but denies any history of COPD, asthma.      Physical Exam   Triage Vital Signs: ED Triage Vitals  Enc Vitals Group     BP 01/18/23 0727 (!) 179/94     Pulse Rate 01/18/23 0727 83     Resp 01/18/23 0727 20     Temp 01/18/23 0727 98.3 F (36.8 C)     Temp Source 01/18/23 0727 Oral     SpO2 01/18/23 0727 97 %     Weight 01/18/23 0729 300 lb (136.1 kg)     Height 01/18/23 0729 5\' 9"  (1.753 m)     Head Circumference --      Peak Flow --      Pain Score 01/18/23 0729 6     Pain Loc --      Pain Edu? --      Excl. in [place]? --     Most recent vital signs: Vitals:   01/18/23 0727  BP: (!) 179/94  Pulse: 83  Resp: 20  Temp: 98.3 F (36.8 C)  SpO2: 97%     General: Awake, no distress.  CV:  Good peripheral perfusion.  Resp:  Normal effort.  Clear lungs Abd:  No distention.  Soft and nontender Other:  May be slightly enlarged left leg over right but nothing obvious on examination. Patient  occasionally coughing, yellowish sputum noted from nose.  Oral pharynx is red but without any evidence of mass.  No tonsillar exudates.  Uvula is midline  ED Results / Procedures / Treatments   Labs (all labs ordered are listed, but only abnormal results are displayed) Labs Reviewed  RESP PANEL BY RT-PCR (RSV, FLU A&B, COVID)  [id number]  GROUP A STREP BY PCR  CBC WITH DIFFERENTIAL/PLATELET  COMPREHENSIVE METABOLIC PANEL  BRAIN NATRIURETIC PEPTIDE  URINALYSIS, ROUTINE W REFLEX MICROSCOPIC  TROPONIN I (HIGH SENSITIVITY)     EKG  My interpretation of EKG:  Normal sinus rate of 69 without any ST elevation, T wave version lead III, QTc 480.  T wave inversion similar to prior EKG on 01/2022  RADIOLOGY I have reviewed the xray personally and interpreted and no evidence of any pneumonia  PROCEDURES:  Critical Care performed: No  Procedures   MEDICATIONS ORDERED IN ED: Medications  ketorolac (TORADOL) 30 MG/ML injection 30 mg (30 mg Intramuscular Given 01/18/23 0839)  acetaminophen (TYLENOL)  tablet 1,000 mg (1,000 mg Oral Given 01/18/23 0851)     IMPRESSION / MDM / ASSESSMENT AND PLAN / ED COURSE  I reviewed the triage vital signs and the nursing notes.   Patient's presentation is most consistent with acute presentation with potential threat to life or bodily function.   Patient comes in with multiple symptoms suspect more likely viral versus allergies versus possible bacterial sinusitis, pneumonia.  X-ray ordered, COVID, flu ordered strep ordered.  For her shortness of breath with laying flat this probably is just related to viral illness but given she does report some intermittent swelling in her left leg and history of blood clots I did order an ultrasound to look at her leg as well as some basic blood work to make sure no signs of CHF, ACS and she does not feel short of breath or have any chest pain sitting at rest so seems less likely ACS or PE.  Patient is hyper concerned but she  states that she did not take her blood pressure medicine yet this morning  Chest xray negative BNP slightly on evaded.  Will send her to cardiology for further workup of this potential echo but she has normal sats and her chest x-ray was negative for any edema CMP showed glucose of 179 potassium was 3.3 will give some oral repletion. Troponin was 11 but symptoms have been ongoing for few days now so unlikely to be ACS. COVID, flu were negative Strep was negative  Discussed with patient antibiotics pros and cons given this is going on for 7 days she would like to trial antibiotics.  Will cover for sinusitis as well as add on Doxy for possible pneumonia.  Patient prescribed some Tessalon Perles recommend to alternate Tylenol, ibuprofen and she will follow-up for recheck with her primary care doctor for her blood pressure but I suspect that this is elevated secondary to missing her blood pressure medicines this morning.  Considered admission but given reassuring workup patient feels comfortable discharge home  Unable to do Augmentin due to penicillin allergy unable to do Levaquin given not covered by insurance the patient has tolerated cefdinir previously so we will trial this with doxycycline.  The patient is on the cardiac monitor to evaluate for evidence of arrhythmia and/or significant heart rate changes.      FINAL CLINICAL IMPRESSION(S) / ED DIAGNOSES   Final diagnoses:  Bronchitis  Sinusitis, unspecified chronicity, unspecified location     Rx / DC Orders   ED Discharge Orders          Ordered    doxycycline (ADOXA) 100 MG tablet  2 times daily        01/18/23 1005    cefdinir (OMNICEF) 300 MG capsule  2 times daily        01/18/23 1005    benzonatate (TESSALON PERLES) 100 MG capsule  3 times daily PRN        01/18/23 1005             Note:  This document was prepared using Dragon voice recognition software and may include unintentional dictation errors.   Vanessa Durbin, MD 01/18/23 1006

## 2023-03-14 ENCOUNTER — Other Ambulatory Visit: Payer: Self-pay | Admitting: Cardiovascular Disease

## 2023-03-14 ENCOUNTER — Ambulatory Visit: Payer: Self-pay | Admitting: Cardiology

## 2023-03-14 MED ORDER — AMLODIPINE BESYLATE 5 MG PO TABS: 5.0000 mg | ORAL_TABLET | Freq: Every day | ORAL | 0 refills | Status: DC

## 2023-03-14 MED ORDER — SPIRONOLACTONE 25 MG PO TABS: 25.0000 mg | ORAL_TABLET | Freq: Every day | ORAL | 0 refills | Status: DC

## 2023-03-14 NOTE — Telephone Encounter (Signed)
Requested Prescriptions   Signed Prescriptions Disp Refills   amLODipine (NORVASC) 5 MG tablet 90 tablet 0    Sig: Take 1 tablet (5 mg total) by mouth daily.    Authorizing Provider: Lorine Bears A    Ordering User: Guerry Minors   spironolactone (ALDACTONE) 25 MG tablet 90 tablet 0    Sig: Take 1 tablet (25 mg total) by mouth daily.    Authorizing Provider: Lorine Bears A    Ordering User: Guerry Minors

## 2023-03-14 NOTE — Telephone Encounter (Signed)
*  STAT* If patient is at the pharmacy, call can be transferred to refill team.   1. Which medications need to be refilled? (please list name of each medication and dose if known)   amLODipine (NORVASC) 5 MG tablet    potassium chloride SA (KLOR-CON M20) 20 MEQ tablet     spironolactone (ALDACTONE) 25 MG tablet    2. Which pharmacy/location (including street and city if local pharmacy) is medication to be sent to? [id number] -  (N), [place] - [address]   3. Do they need a 30 day or 90 day supply? 90 day   Pt has scheduled f/u appt on 03/31/23

## 2023-03-14 NOTE — Telephone Encounter (Signed)
#   90 with no refills sent at last office visit on 02/25/22.  No refills have been sent either.  Does she need to have the potassium filled?

## 2023-03-15 LAB — HM DIABETES EYE EXAM

## 2023-03-16 ENCOUNTER — Encounter: Payer: Self-pay | Admitting: Internal Medicine

## 2023-03-17 NOTE — Telephone Encounter (Signed)
Left message on VM asking patient to call office for clarification on medication.

## 2023-03-18 DIAGNOSIS — G4733 Obstructive sleep apnea (adult) (pediatric): Secondary | ICD-10-CM | POA: Diagnosis not present

## 2023-03-18 MED ORDER — POTASSIUM CHLORIDE CRYS ER 20 MEQ PO TBCR: 20.0000 meq | EXTENDED_RELEASE_TABLET | Freq: Every day | ORAL | 0 refills | Status: DC

## 2023-03-18 NOTE — Telephone Encounter (Signed)
Spoke with the patient. She ran out of the potassium and bought some over the counter. A one month refill has been sent in for her. She has an appointment on 5/30.

## 2023-03-30 NOTE — Progress Notes (Unsigned)
Cardiology Office Note:   Date:  03/31/2023  ID:  Shannon Bryan, DOB Apr 29, 1961, MRN [id number]  History of Present Illness:   Shannon Bryan is a 62 y.o. female with a past medical history of essential hypertension, hyperlipidemia, tobacco use, and obesity, who is here today for follow-up.  She was originally seen in March 2020 for worsening exertional dyspnea and chest pain Uncontrolled hypertension with blood pressure systolic of 200.  At that time she was on HCTZ 25 mg once daily and was started on carvedilol 2.65 mg twice daily.  She underwent Lexiscan Myoview which showed fixed anterior wall defect likely due to breast attenuation.  Ejection fraction estimated was not accurate.  Echocardiogram revealed an LVEF of 55 to 60%, no significant valvular abnormalities or pulmonary hypertension.  She had worsening shortness of breath and chest pain in July 2022 and underwent echocardiogram which revealed normal LV systolic function and no significant valvular abnormalities.  She underwent cardiac CTA in August 2022 which showed a calcium score of 0 with no evidence of obstructive coronary artery disease.  Home sleep study confirmed sleep apnea.  She also stopped smoking within the year before.  She was last seen in clinic 02/25/2022 by Dr.Arida.  At that time she had complaints of edema, shortness of breath with congestion and phlegm and elevated blood sugar levels.  She was sent by states she did follow-up with her primary care provider and blood work was ordered.  She returns to clinic today after recent visit to the emergency department for swelling, cough, shortness of breath.  She also has been experiencing dizziness which is more like vertigo as she feels as though things around her are moving when she is standing still.  Blood pressure is elevated today as she has not had any of her medications.  She has not bouts of peripheral edema.  She also complains of bilateral hip pain thinking that there  potentially could be something wrong with her kidneys.  Further review of blood work when she was in the emergency department revealed kidney function was normalized but she was hypokalemic and had to have potassium supplements given in the emergency department as she has been continued on supplements since discharge from the ER.  She denies any chest pain or palpitations.  She does endorse knee pain and wrist pain.  ROS: 10 point review of systems has been completed and considered negative with exception of what is listed in the HPI  Studies Reviewed:    EKG: No new tracings were completed today  Coronary CTA 06/18/21 IMPRESSION: 1. Normal coronary calcium score of 0. Patient is low risk for coronary events.   2. Normal coronary origin with left dominance.   3. No evidence of CAD.   4. CAD-RADS 0. Consider non-atherosclerotic causes of chest pain.  TTE 06/02/21 1. Left ventricular ejection fraction, by estimation, is 55 to 60%. The  left ventricle has normal function. The left ventricle has no regional  wall motion abnormalities. Left ventricular diastolic parameters are  indeterminate. The average left  ventricular global longitudinal strain is -18.6 %. The global longitudinal  strain is normal.   2. Right ventricular systolic function is normal. The right ventricular  size is normal.   3. The mitral valve is abnormal. Trivial mitral valve regurgitation. No  evidence of mitral stenosis.   4. The aortic valve is tricuspid. Aortic valve regurgitation is mild. No  aortic stenosis is present.    Risk Assessment/Calculations:     HYPERTENSION  CONTROL Vitals:   03/31/23 0936 03/31/23 0945  BP: (!) 166/80 (!) 160/90    The patient's blood pressure is elevated above target today.  In order to address the patient's elevated BP: Labs and/or other diagnostics are currently pending prior to making blood pressure medication adjustments.      STOP-Bang Score:          Physical Exam:    VS:  BP (!) 160/90 (BP Location: Left Arm, Patient Position: Sitting, Cuff Size: Large)   Pulse 66   Ht 5\' 9"  (1.753 m)   Wt (!) 302 lb 12.8 oz (137.3 kg)   SpO2 99%   BMI 44.72 kg/m    Wt Readings from Last 3 Encounters:  03/31/23 (!) 302 lb 12.8 oz (137.3 kg)  01/18/23 300 lb (136.1 kg)  04/29/22 (!) 308 lb 12.8 oz (140.1 kg)     GEN: Well nourished, well developed in no acute distress NECK: No JVD; No carotid bruits CARDIAC: RRR, distant, no murmurs, rubs, gallops RESPIRATORY:  Clear to auscultation without rales, wheezing or rhonchi  ABDOMEN: Soft, non-tender, obese, non-distended EXTREMITIES:  1+ edema; BLE No deformity   ASSESSMENT AND PLAN:   Shortness of breath and exertional dyspnea.  Cardiac CTA was normal last year.  With elevated BNP noted in the emergency department and worsening peripheral edema and associated shortness of breath she has been scheduled for an echocardiogram.  Essential hypertension with blood pressure 166/80 repeated 160/90.  Unfortunately patient did not have any of her medications today.  She has been encouraged to keep a blood pressure log of her blood pressures 1 to 2 hours post medications to determine effectiveness of her medications.  She is currently being continued on Norvasc, carvedilol, HCTZ, and spironolactone.  Would be hesitant to uptitrate amlodipine due to complaints of peripheral edema.  Mixed hyperlipidemia with aortic atherosclerosis with last LDL of 136 where she is on Pravachol 10 mg daily.  Will need repeat lipid panel on return.  Hypokalemia with serum potassium 3.3 noted during a recent emergency department visit.  She has been continued on potassium supplements.  She is also being sent for BMP and mag level today to reevaluate her levels.  Dizziness with the room spinning.  That sounds more less like in her ear and vertigo.  Patient states that she had recently been treated for an ear infection and will follow-up with ENT if it  does not resolve.  Yeast infection to the under side of her pannus that is caused a rash.  She has been sent in nystatin cream to use 2-3 times daily for the next week.  Obstructive sleep apnea continued on nightly CPAP.  Morbid obesity with a BMI 44.72.  Decrease caloric intake and increased activity would be beneficial as weight loss would likely decrease her symptoms.  Disposition patient return to clinic to see MD/APP once echocardiogram is completed.        Signed, Alaa Mullally, NP

## 2023-03-31 ENCOUNTER — Encounter: Payer: Self-pay | Admitting: Cardiology

## 2023-03-31 ENCOUNTER — Other Ambulatory Visit
Admission: RE | Admit: 2023-03-31 | Discharge: 2023-03-31 | Disposition: A | Discharge status: Home / Self Care | Payer: BC Managed Care – PPO | Source: Ambulatory Visit | Attending: Cardiology | Admitting: Cardiology

## 2023-03-31 ENCOUNTER — Ambulatory Visit: Payer: BC Managed Care – PPO | Attending: Cardiology | Admitting: Cardiology

## 2023-03-31 VITALS — BP 160/90 | HR 66 | Ht 69.0 in | Wt 302.8 lb

## 2023-03-31 DIAGNOSIS — R42 Dizziness and giddiness: Secondary | ICD-10-CM

## 2023-03-31 DIAGNOSIS — R0602 Shortness of breath: Secondary | ICD-10-CM

## 2023-03-31 DIAGNOSIS — G4733 Obstructive sleep apnea (adult) (pediatric): Secondary | ICD-10-CM

## 2023-03-31 DIAGNOSIS — E785 Hyperlipidemia, unspecified: Secondary | ICD-10-CM | POA: Diagnosis not present

## 2023-03-31 DIAGNOSIS — E876 Hypokalemia: Secondary | ICD-10-CM

## 2023-03-31 DIAGNOSIS — B372 Candidiasis of skin and nail: Secondary | ICD-10-CM

## 2023-03-31 DIAGNOSIS — I10 Essential (primary) hypertension: Secondary | ICD-10-CM

## 2023-03-31 DIAGNOSIS — I700 Atherosclerosis of aorta: Secondary | ICD-10-CM

## 2023-03-31 LAB — BASIC METABOLIC PANEL
Anion gap: 8 (ref 5–15)
BUN: 8 mg/dL (ref 6–20)
CO2: 25 mmol/L (ref 22–32)
Calcium: 8.9 mg/dL (ref 8.9–10.3)
Chloride: 105 mmol/L (ref 98–111)
Creatinine, Ser: 0.81 mg/dL (ref 0.44–1.00)
GFR, Estimated: >60 mL/min (ref >60)
Glucose, Bld: 178 mg/dL — ABNORMAL HIGH (ref 70–99)
Potassium: 3.8 mmol/L (ref 3.5–5.1)
Sodium: 138 mmol/L (ref 135–145)

## 2023-03-31 LAB — MAGNESIUM: Magnesium: 2.1 mg/dL (ref 1.7–2.4)

## 2023-03-31 MED ORDER — NYSTATIN 100000 UNIT/GM EX CREA: 1.0000 | TOPICAL_CREAM | Freq: Three times a day (TID) | CUTANEOUS | 0 refills | Status: DC

## 2023-03-31 NOTE — Patient Instructions (Signed)
Medication Instructions:  Your physician has recommended you make the following change in your medication:   nystatin cream (MYCOSTATIN) - Apply 1 Application topically 3 (three) times daily  *If you need a refill on your cardiac medications before your next appointment, please call your pharmacy*  Lab Work: Your physician recommends that get lab work today: [employer] and [employer] executive at [facility] 1st desk on the right to check in (REGISTRATION)  Lab hours: Monday- Friday (7:30 am- 5:30 pm)  If you have labs (blood work) drawn today and your tests are completely normal, you will receive your results only by: MyChart Message (if you have MyChart) OR A paper copy in the mail If you have any lab test that is abnormal or we need to change your treatment, we will call you to review the results.  Testing/Procedures: Your physician has requested that you have an echocardiogram. Echocardiography is a painless test that uses sound waves to create images of your heart. It provides your doctor with information about the size and shape of your heart and how well your heart's chambers and valves are working. This procedure takes approximately one hour. There are no restrictions for this procedure. Please do NOT wear cologne, perfume, aftershave, or lotions (deodorant is allowed). Please arrive 15 minutes prior to your appointment time.   Follow-Up: At [facility], you and your health needs are our priority.  As part of our continuing mission to provide you with exceptional heart care, we have created designated Provider Care Teams.  These Care Teams include your primary Cardiologist (physician) and Advanced Practice Providers (APPs -  Physician Assistants and Nurse Practitioners) who all work together to provide you with the care you need, when you need it.  Your next appointment:    Follow-up after echocardiogram  Provider:   You may see Lorine Bears, MD or one of the following  Advanced Practice Providers on your designated Care Team:   Nicolasa Ducking, NP Eula Listen, PA-C Cadence Fransico Michael, PA-C Charlsie Quest, NP    Other Instructions -None

## 2023-04-01 NOTE — Progress Notes (Signed)
Labs are stable with the exception of elevated blood glucose.  Recommend decreasing intake of carbohydrates and sweets.  Continue to follow with PCP for diabetic management.

## 2023-04-18 DIAGNOSIS — G4733 Obstructive sleep apnea (adult) (pediatric): Secondary | ICD-10-CM | POA: Diagnosis not present

## 2023-05-18 DIAGNOSIS — G4733 Obstructive sleep apnea (adult) (pediatric): Secondary | ICD-10-CM | POA: Diagnosis not present

## 2023-05-24 ENCOUNTER — Ambulatory Visit: Payer: BC Managed Care – PPO | Attending: Cardiology

## 2023-05-24 DIAGNOSIS — R0602 Shortness of breath: Secondary | ICD-10-CM

## 2023-05-24 LAB — ECHOCARDIOGRAM COMPLETE
AR max vel: 2.04 cm2
AV Area VTI: 2.13 cm2
AV Area mean vel: 2.11 cm2
AV Mean grad: 6 mmHg
AV Peak grad: 12.3 mmHg
AV Vena cont: 0.8 cm
Ao pk vel: 1.76 m/s
Area-P 1/2: 2.76 cm2
Calc EF: 54 %
P 1/2 time: 462 msec
S' Lateral: 3.4 cm
Single Plane A2C EF: 52.8 %
Single Plane A4C EF: 54 %

## 2023-05-26 NOTE — Progress Notes (Signed)
Heart squeeze is 60-65%, there is some wall stiffness that is usually related to age and high blood pressure, there is also mild leakage noted in th aortic valve. Overall reassuring study. Nothing to suggest the cause of the shortness of breath.

## 2023-05-31 ENCOUNTER — Ambulatory Visit: Payer: BC Managed Care – PPO | Attending: Cardiology | Admitting: Cardiology

## 2023-05-31 ENCOUNTER — Encounter: Payer: Self-pay | Admitting: Cardiology

## 2023-05-31 ENCOUNTER — Ambulatory Visit (INDEPENDENT_AMBULATORY_CARE_PROVIDER_SITE_OTHER): Payer: BC Managed Care – PPO

## 2023-05-31 VITALS — BP 158/70 | HR 59 | Ht 69.0 in | Wt 296.6 lb

## 2023-05-31 DIAGNOSIS — I10 Essential (primary) hypertension: Secondary | ICD-10-CM | POA: Diagnosis not present

## 2023-05-31 DIAGNOSIS — R002 Palpitations: Secondary | ICD-10-CM | POA: Diagnosis not present

## 2023-05-31 DIAGNOSIS — R0602 Shortness of breath: Secondary | ICD-10-CM

## 2023-05-31 DIAGNOSIS — M25552 Pain in left hip: Secondary | ICD-10-CM

## 2023-05-31 DIAGNOSIS — M159 Polyosteoarthritis, unspecified: Secondary | ICD-10-CM

## 2023-05-31 DIAGNOSIS — M25551 Pain in right hip: Secondary | ICD-10-CM

## 2023-05-31 DIAGNOSIS — M25561 Pain in right knee: Secondary | ICD-10-CM

## 2023-05-31 DIAGNOSIS — E782 Mixed hyperlipidemia: Secondary | ICD-10-CM

## 2023-05-31 DIAGNOSIS — G4733 Obstructive sleep apnea (adult) (pediatric): Secondary | ICD-10-CM

## 2023-05-31 DIAGNOSIS — M25562 Pain in left knee: Secondary | ICD-10-CM

## 2023-05-31 DIAGNOSIS — R42 Dizziness and giddiness: Secondary | ICD-10-CM

## 2023-05-31 DIAGNOSIS — E1165 Type 2 diabetes mellitus with hyperglycemia: Secondary | ICD-10-CM

## 2023-05-31 MED ORDER — LOSARTAN POTASSIUM 25 MG PO TABS: 25.0000 mg | ORAL_TABLET | Freq: Every day | ORAL | 3 refills | Status: DC

## 2023-05-31 NOTE — Progress Notes (Signed)
Cardiology Office Note:  .   Date:  05/31/2023  ID:  Melitza Lisiecki, DOB February 08, 1961, MRN [id number] PCP: Lorre Munroe, NP  [facility] Providers Cardiologist:  Lorine Bears, MD Sleep Medicine:  Armanda Magic, MD    History of Present Illness: .   Anneelizabeth Sprow is a 62 y.o. female with past medical history of essential hypertension, hyperlipidemia, tobacco use, and obesity, who is here today following up on her exertional dyspnea and previous episodes of chest discomfort.  She was originally seen in March 2020 for worsening exertional dyspnea and chest pain.  She had uncontrolled hypertension with blood pressures of 200s systolic.  At that time she was on HCTZ 25 mg once daily was started on carvedilol 6.25 mg twice daily.  She underwent Lexiscan Myoview which showed fixed anterior wall defect likely due to breast attenuation.  Ejection fraction estimated was not high-grade.  Echocardiogram revealed an LVEF of 55 to 60%, no significant valvular abnormalities or pulmonary hypertension.  She worsening shortness of breath and chest pain in July 2022 and underwent echocardiogram which revealed normal LV systolic function with no significant valvular abnormalities.  She underwent a cardiac CTA in August 2022 which showed a calcium score of 0 with no evidence of obstructive coronary artery disease.  Home sleep study confirms sleep apnea.  She also stopped smoking within the year before.  She was last seen in clinic 03/21/2023 after recent visit to the emergency department for swelling, cough, and shortness of breath.  She been experiencing dyspnea metabolic vertigo and she feels things around removing.  She complained of bilateral hip pain thinking that there potentially could be something wrong with her kidneys.  Further review of blood work which she was in the emergency department for a kidney function was normal but she was hypokalemic and potassium supplements given in the emergency  department.  She was sent for repeat blood work, scheduled for an echocardiogram, and started on nystatin cream to the yeast infection under her pannus.  She returns to clinic today with complaints of palpitations that are pounding hard heart rates.  Continues to have numbness in her shoulders.  Blood pressures remain elevated.  She is complaining of pain in her back, hips, and knees.  She denies any chest pain or worsening shortness of breath.  She has not had any visits to the emergency department or recent hospitalizations.  She has also not followed up recently with her PCP.  Her last hemoglobin A1c was noted to be at 9.1 and due to inability to prick her fingers she has not been adequately monitoring her blood sugars.  ROS: 10 point review of systems has been reviewed and considered negative with exception of what is been listed in the HPI  Studies Reviewed: Marland Kitchen   EKG Interpretation Date/Time:  Tuesday May 31 2023 14:27:07 EDT Ventricular Rate:  59 PR Interval:  194 QRS Duration:  86 QT Interval:  458 QTC Calculation: 453 R Axis:   14  Text Interpretation: Sinus bradycardia When compared with ECG of 18-Jan-2023 08:43, Confirmed by Charlsie Quest ([id number]) on 05/31/2023 2:35:34 PM   TTE 05/24/23 1. Left ventricular ejection fraction, by estimation, is 60 to 65%. Left  ventricular ejection fraction by PLAX is 58 %. The left ventricle has  normal function. The left ventricle has no regional wall motion  abnormalities. There is mild left ventricular  hypertrophy. Left ventricular diastolic parameters are consistent with  Grade I diastolic dysfunction (impaired relaxation).   2.  Right ventricular systolic function is normal. The right ventricular  size is normal.   3. The mitral valve is normal in structure. No evidence of mitral valve  regurgitation. No evidence of mitral stenosis.   4. The aortic valve has an indeterminant number of cusps. Aortic valve  regurgitation is mild. No aortic  stenosis is present.   5. The inferior vena cava is normal in size with greater than 50%  respiratory variability, suggesting right atrial pressure of 3 mmHg.   Coronary CTA 06/18/21 IMPRESSION: 1. Normal coronary calcium score of 0. Patient is low risk for coronary events.   2. Normal coronary origin with left dominance.   3. No evidence of CAD.   4. CAD-RADS 0. Consider non-atherosclerotic causes of chest pain.   TTE 06/02/21 1. Left ventricular ejection fraction, by estimation, is 55 to 60%. The  left ventricle has normal function. The left ventricle has no regional  wall motion abnormalities. Left ventricular diastolic parameters are  indeterminate. The average left  ventricular global longitudinal strain is -18.6 %. The global longitudinal  strain is normal.   2. Right ventricular systolic function is normal. The right ventricular  size is normal.   3. The mitral valve is abnormal. Trivial mitral valve regurgitation. No  evidence of mitral stenosis.   4. The aortic valve is tricuspid. Aortic valve regurgitation is mild. No  aortic stenosis is present.  Risk Assessment/Calculations:     Physical Exam:   VS:  BP (!) 158/70 (BP Location: Left Arm, Patient Position: Sitting, Cuff Size: Large)   Pulse (!) 59   Ht 5\' 9"  (1.753 m)   Wt 296 lb 9.6 oz (134.5 kg)   SpO2 91%   BMI 43.80 kg/m    Wt Readings from Last 3 Encounters:  05/31/23 296 lb 9.6 oz (134.5 kg)  03/31/23 (!) 302 lb 12.8 oz (137.3 kg)  01/18/23 300 lb (136.1 kg)    GEN: Well nourished, well developed in no acute distress NECK: No JVD; No carotid bruits CARDIAC: RRR, no murmurs, rubs, gallops RESPIRATORY:  Clear to auscultation without rales, wheezing or rhonchi  ABDOMEN: Soft, non-tender, obese, non-distended EXTREMITIES: Trace pretibial edema; No deformity   ASSESSMENT AND PLAN: .   Worsening palpitations even with decreasing caffeine intake.  EKG today reveals sinus bradycardia with no ischemic changes  noted.  With her continued concerns of palpitations she has been placed on a ZIO XT monitor for 2 weeks to rule out arrhythmia.  Continued shortness of breath and exertional dyspnea with a coronary CTA revealed a calcium score of 0.  Recent echocardiogram revealed LVEF of 60 to 65%, no regional wall motion abnormalities, G1 DD, and mild aortic regurgitation.  Likely multifactorial from elevated blood pressure, obesity, and dietary indiscretion.  Essential hypertension with blood pressure today 158/70 patient did not want to repeat a blood pressure as it caused her to have spots on her her upper arm during appointment today.  She is continued on amlodipine 5 mg daily, carvedilol 6.25 mg twice daily, HCTZ 25 mg daily and spironolactone 25 mg daily.  With her blood pressure continued to be elevated in chart review revealing elevated blood pressures she is also being started on losartan 25 mg daily.  She will need repeat BMP in 2 weeks after starting medication.  Mixed hyperlipidemia with aortic atherosclerosis last LDL of 136 in 03/2022.  She is continued on pravastatin 10 mg daily and she was intolerant to atorvastatin.  She will need lipid panel  on return.  Dizziness with room spinning.  Sounds more like inner ear and vertigo.  She had recently been treated for an ear infection and will follow-up with ENT if it did not resolve.  Did discuss physical therapy and she is not interested in PT for vertigo.  Yeast infection on the underside of her pannus that is resolved with the use of nystatin cream.  Obstructive sleep apnea where she is continued on nightly CPAP.  Continued complaints of pain to hips and back and knees.  Denies symptoms of claudication.  Referral sent to orthopedics for further evaluation.  Morbid obesity with a BMI of 43.8.  Discuss weight loss would be beneficial exercise does complicate treatment prognoses.  Decrease caloric intake and increasing activity is beneficial.  Type 2  diabetes with last hemoglobin A1c of 9.1.  Patient has not followed up with PCP and is recommended for follow-up.  She has not been checking her sugars as she has inability to bring her finger.  We have sent correspondence to PCP office about ordering her a [facility] for continuous monitoring versus finger sticks.       Dispo: Patient to return to clinic to see MD/APP in 6 weeks or sooner if needed to reevaluate symptoms.  Signed, Kaegan Stigler, NP

## 2023-05-31 NOTE — Patient Instructions (Addendum)
Medication Instructions:   Start Taking: Losartan 25 mg once daily   *If you need a refill on your cardiac medications before your next appointment, please call your pharmacy*   Lab Work: Your provider would like for you to return in 2 weeks to have the following labs drawn: BMP.   Please go to [facility] [address] ([facility] [address], [place] [address] You do not need an appointment.  They are open from 7:30 am-4 pm.  Lunch from 1:00 pm- 2:00 pm You don't need to be fasting.   You may also go to any of these [facility] locations:  [employer]  - [address] [employer] [address] Chief Technology Officer)     If you have labs (blood work) drawn today and your tests are completely normal, you will receive your results only by: [employer] (if you have MyChart) OR A paper copy in the mail If you have any lab test that is abnormal or we need to change your treatment, we will call you to review the results.   Testing/Procedures: Your physician has recommended that you wear a Zio monitor. 14 days   This monitor is a medical device that records the heart's electrical activity. Doctors most often use these monitors to diagnose arrhythmias. Arrhythmias are problems with the speed or rhythm of the heartbeat. The monitor is a small device applied to your chest. You can wear one while you do your normal daily activities. While wearing this monitor if you have any symptoms to push the button and record what you felt. Once you have worn this monitor for the period of time provider prescribed (Usually 14 days), you will return the monitor device in the postage paid box. Once it is returned they will download the data collected and provide [place] with a report which the provider will then review and we will call you with those results. Important tips:  Avoid showering during the first 24 hours of wearing the monitor. Avoid excessive sweating to help maximize wear time. Do  not submerge the device, no hot tubs, and no swimming pools. Keep any lotions or oils away from the patch. After 24 hours you may shower with the patch on. Take brief showers with your back facing the shower head.  Do not remove patch once it has been placed because that will interrupt data and decrease adhesive wear time. Push the button when you have any symptoms and write down what you were feeling. Once you have completed wearing your monitor, remove and place into box which has postage paid and place in your outgoing mailbox.  If for some reason you have misplaced your box then call our office and we can provide another box and/or mail it off for you.      Follow-Up: At [facility], you and your health needs are our priority.  As part of our continuing mission to provide you with exceptional heart care, we have created designated Provider Care Teams.  These Care Teams include your primary Cardiologist (physician) and Advanced Practice Providers (APPs -  Physician Assistants and Nurse Practitioners) who all work together to provide you with the care you need, when you need it.  We recommend signing up for the patient portal called "MyChart".  Sign up information is provided on this After Visit Summary.  MyChart is used to connect with patients for Virtual Visits (Telemedicine).  Patients are able to view lab/test results, encounter notes, upcoming appointments, etc.  Non-urgent  messages can be sent to your provider as well.   To learn more about what you can do with MyChart, go to [email or web address].    Your next appointment:   6 week(s)  Provider:   You may see Lorine Bears, MD or one of the following Advanced Practice Providers on your designated Care Team:   Charlsie Quest, NP

## 2023-06-03 DIAGNOSIS — R002 Palpitations: Secondary | ICD-10-CM

## 2023-06-08 ENCOUNTER — Encounter (HOSPITAL_BASED_OUTPATIENT_CLINIC_OR_DEPARTMENT_OTHER): Payer: Self-pay | Admitting: Orthopaedic Surgery

## 2023-06-08 ENCOUNTER — Encounter: Payer: Self-pay | Admitting: Physical Therapy

## 2023-06-08 ENCOUNTER — Ambulatory Visit (INDEPENDENT_AMBULATORY_CARE_PROVIDER_SITE_OTHER): Payer: BC Managed Care – PPO | Admitting: Orthopaedic Surgery

## 2023-06-08 ENCOUNTER — Ambulatory Visit (INDEPENDENT_AMBULATORY_CARE_PROVIDER_SITE_OTHER): Payer: BC Managed Care – PPO | Admitting: Physical Therapy

## 2023-06-08 ENCOUNTER — Ambulatory Visit (INDEPENDENT_AMBULATORY_CARE_PROVIDER_SITE_OTHER): Payer: BC Managed Care – PPO

## 2023-06-08 DIAGNOSIS — M25461 Effusion, right knee: Secondary | ICD-10-CM | POA: Diagnosis not present

## 2023-06-08 DIAGNOSIS — M25561 Pain in right knee: Secondary | ICD-10-CM

## 2023-06-08 DIAGNOSIS — M222X1 Patellofemoral disorders, right knee: Secondary | ICD-10-CM | POA: Diagnosis not present

## 2023-06-08 DIAGNOSIS — M25551 Pain in right hip: Secondary | ICD-10-CM

## 2023-06-08 DIAGNOSIS — M25552 Pain in left hip: Secondary | ICD-10-CM

## 2023-06-08 DIAGNOSIS — M1712 Unilateral primary osteoarthritis, left knee: Secondary | ICD-10-CM | POA: Diagnosis not present

## 2023-06-08 DIAGNOSIS — G8929 Other chronic pain: Secondary | ICD-10-CM | POA: Diagnosis not present

## 2023-06-08 DIAGNOSIS — M25562 Pain in left knee: Secondary | ICD-10-CM

## 2023-06-08 DIAGNOSIS — M25462 Effusion, left knee: Secondary | ICD-10-CM | POA: Diagnosis not present

## 2023-06-08 DIAGNOSIS — M222X2 Patellofemoral disorders, left knee: Secondary | ICD-10-CM

## 2023-06-08 DIAGNOSIS — R2689 Other abnormalities of gait and mobility: Secondary | ICD-10-CM

## 2023-06-08 NOTE — Therapy (Signed)
OUTPATIENT PHYSICAL THERAPY SCREEN @[address]   Patient Name: Shannon Bryan MRN: [id number] DOB:May 06, 1961, 62 y.o., female Today's Date: 06/08/2023  END OF SESSION:  PT End of Session - 06/08/23 1448     Visit Number 1    Number of Visits 1    Date for PT Re-Evaluation 06/08/23    PT Start Time 1410    PT Stop Time 1440    PT Time Calculation (min) 30 min    Activity Tolerance Patient tolerated treatment well    Behavior During Therapy WFL for tasks assessed/performed             Past Medical History:  Diagnosis Date   Asthma    Blood clot in vein    Chronic kidney disease    Diabetes mellitus without complication (HCC)    Hyperlipidemia    Hypertension    Kidney stone    Sleep apnea    Past Surgical History:  Procedure Laterality Date   ABDOMINAL HYSTERECTOMY     ABLATION     BREAST RECONSTRUCTION WITH MASTOPLASTY Bilateral 03/11/2020   Procedure: BREAST RECONSTRUCTION WITH MASTOPLASTY;  Surgeon: Eloise Levels, MD;  Location: [facility];  Service: Plastics;  Laterality: Bilateral;   CHOLECYSTECTOMY     COLONOSCOPY WITH PROPOFOL N/A 04/19/2022   Procedure: COLONOSCOPY WITH PROPOFOL;  Surgeon: Toney Reil, MD;  Location: [facility] ENDOSCOPY;  Service: Gastroenterology;  Laterality: N/A;   ROTATOR CUFF REPAIR Right    TUBAL LIGATION     Patient Active Problem List   Diagnosis Date Noted   Aortic atherosclerosis (HCC) 03/15/2022   HLD (hyperlipidemia) 03/15/2022   Type 2 diabetes mellitus with hyperglycemia, without long-term current use of insulin (HCC) 03/15/2022   Osteoarthritis 03/15/2022   Morbid obesity (HCC) 03/15/2022   Essential hypertension 12/22/2017     THERAPY DIAG:  Pain in right hip  Pain in left hip  Chronic pain of right knee  Chronic pain of left knee  Other abnormalities of gait and mobility  Goal of screen:  This patient was referred to Physical Therapy specialty screen by Huel Cote, MD  for HEP and education for preparation of rehabilitation POC.   [employer] HEP code:  Access Code: [email or web address] URL: [email or web address] Date: 06/08/2023 Prepared by: Lorayne Bender  Exercises - Supine Lower Trunk Rotation  - 1 x daily - 7 x weekly - 3 sets - 10 reps - Long Sitting 4 Way Patellar Glide  - 1 x daily - 7 x weekly - 3 sets - 10 reps - Theracane Over Shoulder  - 1 x daily - 7 x weekly - 3 sets - 10 reps - Standing Glute Med Mobilization with Small Ball on Wall  - 1 x daily - 7 x weekly - 3 sets - 10 reps - Supine Quad Set  - 1 x daily - 7 x weekly - 3 sets - 10 reps - Seated Hip Abduction with Resistance  - 1 x daily - 7 x weekly - 3 sets - 10 reps [email or web address]  Clinical Impression & Plan: Patient is a 62 year old female with a history of chronic bilateral hip pain and bilateral knee pain.  She has limited patellar movement bilateral.  She has significant difficulty lying supine secondary to pain in both hips.  She has tenderness palpation in bilateral gluteals.  She also has tenderness to palpation of bilateral patella.  She would benefit from skilled therapy to improve ability to ambulate and stand.  She is given a basic  program for hip mobility and strengthening.  She does have consistent positional limitations.  She is unable to lie supine.  Has difficulty performing some of the activities at the edge of the table.  She would benefit from physical therapy in the clinic.    Army Fossa PT, DPT 06/08/2023, 2:50 PM  [address] [place], [place] [address] [phone]   Note: charges not applied for screen.

## 2023-06-08 NOTE — Progress Notes (Signed)
Chief Complaint: Bilateral knee pain     History of Present Illness:    Shannon Bryan is a 62 y.o. female presents with bilateral knee pain which has been ongoing now for many months.  She do not have specific injury or incident.  She is also complaining of predominantly bilateral hip pain as well.  She states that she has a harder time sitting for longer periods of time with a deep aching  pain in the knee.  She is here today with her daughter for further discussion.    Surgical History:   None  PMH/PSH/Family History/Social History/Meds/Allergies:    Past Medical History:  Diagnosis Date   Asthma    Blood clot in vein    Chronic kidney disease    Diabetes mellitus without complication (HCC)    Hyperlipidemia    Hypertension    Kidney stone    Sleep apnea    Past Surgical History:  Procedure Laterality Date   ABDOMINAL HYSTERECTOMY     ABLATION     BREAST RECONSTRUCTION WITH MASTOPLASTY Bilateral 03/11/2020   Procedure: BREAST RECONSTRUCTION WITH MASTOPLASTY;  Surgeon: Eloise Levels, MD;  Location: [facility];  Service: Plastics;  Laterality: Bilateral;   CHOLECYSTECTOMY     COLONOSCOPY WITH PROPOFOL N/A 04/19/2022   Procedure: COLONOSCOPY WITH PROPOFOL;  Surgeon: Toney Reil, MD;  Location: [facility] ENDOSCOPY;  Service: Gastroenterology;  Laterality: N/A;   ROTATOR CUFF REPAIR Right    TUBAL LIGATION     Social History   Socioeconomic History   Marital status: Single    Spouse name: Not on file   Number of children: Not on file   Years of education: Not on file   Highest education level: Not on file  Occupational History   Not on file  Tobacco Use   Smoking status: Some Days    Current packs/day: 0.00    Average packs/day: 0.3 packs/day for 30.0 years (7.5 ttl pk-yrs)    Types: Cigarettes    Start date: 02/02/1990    Last attempt to quit: 02/03/2020    Years since quitting: 3.3   Smokeless  tobacco: Former   Tobacco comments:    1/4 - 1/2 ppd had quit for 1 year in 2010  Vaping Use   Vaping status: Some Days   Devices: Vaping  Substance and Sexual Activity   Alcohol use: Yes    Alcohol/week: 1.0 standard drink of alcohol    Types: 1 Glasses of wine per week    Comment: occasional   Drug use: No   Sexual activity: Never  Other Topics Concern   Not on file  Social History Narrative   Not on file   Social Determinants of Health   Financial Resource Strain: Not on file  Food Insecurity: Not on file  Transportation Needs: Not on file  Physical Activity: Not on file  Stress: Not on file  Social Connections: Not on file   Family History  Problem Relation Age of Onset   Heart disease Mother    Stroke Mother    Diabetes Mother    Cancer Mother        bone   Depression Mother    Diabetes Father    Emphysema Father    Allergies  Allergen Reactions   Bactrim [Sulfamethoxazole-Trimethoprim] Hives  Penicillins Swelling and Other (See Comments)   Sulfa Antibiotics Other (See Comments)   Cleocin [Clindamycin Hcl] Rash   Current Outpatient Medications  Medication Sig Dispense Refill   acetaminophen (TYLENOL) 500 MG tablet Take 500 mg by mouth every 6 (six) hours as needed.     albuterol (VENTOLIN HFA) 108 (90 Base) MCG/ACT inhaler SMARTSIG:2 Puff(s) By Mouth Every 6 Hours PRN     amLODipine (NORVASC) 5 MG tablet Take 1 tablet (5 mg total) by mouth daily. 90 tablet 0   aspirin EC 81 MG tablet Take 81 mg by mouth daily.     benzonatate (TESSALON PERLES) 100 MG capsule Take 1 capsule (100 mg total) by mouth 3 (three) times daily as needed for cough. 30 capsule 0   carvedilol (COREG) 6.25 MG tablet Take 1 tablet (6.25 mg total) by mouth 2 (two) times daily. 180 tablet 3   cetirizine (ZYRTEC) 10 MG tablet Take 10 mg by mouth daily.     hydrochlorothiazide (HYDRODIURIL) 25 MG tablet Take 1 tablet (25 mg total) by mouth daily. 90 tablet 3   ibuprofen (ADVIL) 800 MG  tablet Take 1 tablet by mouth three times daily as needed 90 tablet 0   losartan (COZAAR) 25 MG tablet Take 1 tablet (25 mg total) by mouth daily. 90 tablet 3   Multiple Vitamins-Minerals (ONE-A-DAY 50 PLUS PO) Take 1 tablet by mouth daily.     nystatin cream (MYCOSTATIN) Apply 1 Application topically 3 (three) times daily. 30 g 0   potassium chloride SA (KLOR-CON M20) 20 MEQ tablet Take 1 tablet (20 mEq total) by mouth daily. 30 tablet 0   pravastatin (PRAVACHOL) 10 MG tablet Take 1 tablet (10 mg total) by mouth daily. 30 tablet 2   spironolactone (ALDACTONE) 25 MG tablet Take 1 tablet (25 mg total) by mouth daily. 90 tablet 0   No current facility-administered medications for this visit.   No results found.  Review of Systems:   A ROS was performed including pertinent positives and negatives as documented in the HPI.  Physical Exam :   Constitutional: NAD and appears stated age Neurological: Alert and oriented Psych: Appropriate affect and cooperative There were no vitals taken for this visit.   Comprehensive Musculoskeletal Exam:    Tenderness palpation about bilateral patellofemoral joints with positive patellar grind.  Range of motion is from 0 to 120 degrees.  Remainder of distal neurosensory exam is intact  Imaging:   Xray (4 views right knee, 4 views left knee): Normal    I personally reviewed and interpreted the radiographs.   Assessment:   62 y.o. female with evidence of bilateral patellofemoral type pain syndrome.  At today's visit I do believe this may be emanating from the hips.  I have engaged her with physical therapy and would like her to complete into physical therapy program for strengthening of the hips and thighs.  I would like to see her back to assess her progress in 6 weeks and we will plan for x-rays of the bilateral hips at that time  Plan :    -Return to clinic 6 weeks for reassessment     I personally saw and evaluated the patient, and  participated in the management and treatment plan.  Huel Cote, MD Attending Physician, Orthopedic Surgery  This document was dictated using Dragon voice recognition software. A reasonable attempt at proof reading has been made to minimize errors.

## 2023-06-09 ENCOUNTER — Encounter: Payer: Self-pay | Admitting: Internal Medicine

## 2023-06-09 ENCOUNTER — Telehealth (INDEPENDENT_AMBULATORY_CARE_PROVIDER_SITE_OTHER): Payer: BC Managed Care – PPO | Admitting: Internal Medicine

## 2023-06-09 VITALS — BP 134/76 | HR 85 | Temp 96.4°F | Ht 69.0 in | Wt 298.0 lb

## 2023-06-09 DIAGNOSIS — I700 Atherosclerosis of aorta: Secondary | ICD-10-CM

## 2023-06-09 DIAGNOSIS — Z114 Encounter for screening for human immunodeficiency virus [HIV]: Secondary | ICD-10-CM | POA: Diagnosis not present

## 2023-06-09 DIAGNOSIS — E1165 Type 2 diabetes mellitus with hyperglycemia: Secondary | ICD-10-CM

## 2023-06-09 DIAGNOSIS — Z23 Encounter for immunization: Secondary | ICD-10-CM | POA: Diagnosis not present

## 2023-06-09 DIAGNOSIS — Z1231 Encounter for screening mammogram for malignant neoplasm of breast: Secondary | ICD-10-CM

## 2023-06-09 DIAGNOSIS — E785 Hyperlipidemia, unspecified: Secondary | ICD-10-CM

## 2023-06-09 DIAGNOSIS — M159 Polyosteoarthritis, unspecified: Secondary | ICD-10-CM | POA: Diagnosis not present

## 2023-06-09 DIAGNOSIS — Z1159 Encounter for screening for other viral diseases: Secondary | ICD-10-CM | POA: Diagnosis not present

## 2023-06-09 DIAGNOSIS — I10 Essential (primary) hypertension: Secondary | ICD-10-CM | POA: Diagnosis not present

## 2023-06-09 DIAGNOSIS — Z6841 Body Mass Index (BMI) 40.0 and over, adult: Secondary | ICD-10-CM

## 2023-06-09 DIAGNOSIS — E1169 Type 2 diabetes mellitus with other specified complication: Secondary | ICD-10-CM

## 2023-06-09 DIAGNOSIS — G4733 Obstructive sleep apnea (adult) (pediatric): Secondary | ICD-10-CM

## 2023-06-09 LAB — CBC
HCT: 44.7 % (ref 35.0–45.0)
Hemoglobin: 14.7 g/dL (ref 11.7–15.5)
MCH: 26.5 pg — ABNORMAL LOW (ref 27.0–33.0)
MCHC: 32.9 g/dL (ref 32.0–36.0)
MCV: 80.7 fL (ref 80.0–100.0)
MPV: 11 fL (ref 7.5–12.5)
Platelets: 293 10*3/uL (ref 140–400)
RBC: 5.54 10*6/uL — ABNORMAL HIGH (ref 3.80–5.10)
RDW: 14.5 % (ref 11.0–15.0)
WBC: 7.4 10*3/uL (ref 3.8–10.8)

## 2023-06-09 LAB — POCT GLYCOSYLATED HEMOGLOBIN (HGB A1C): Hemoglobin A1C: 8.7 % — AB (ref 4.0–5.6)

## 2023-06-09 MED ORDER — OZEMPIC (0.25 OR 0.5 MG/DOSE) 2 MG/3ML ~~LOC~~ SOPN: 0.5000 mg | PEN_INJECTOR | SUBCUTANEOUS | 0 refills | Status: DC

## 2023-06-09 MED ORDER — METFORMIN HCL 1000 MG PO TABS: 1000.0000 mg | ORAL_TABLET | Freq: Two times a day (BID) | ORAL | 0 refills | Status: DC

## 2023-06-09 NOTE — Patient Instructions (Signed)

## 2023-06-09 NOTE — Addendum Note (Signed)
Addended by: Paschal Dopp on: 06/09/2023 09:38 AM   Modules accepted: Orders

## 2023-06-09 NOTE — Assessment & Plan Note (Signed)
Encourage weight loss as this can help reduce joint pain She is currently following with orthopedics Continue ibuprofen as needed

## 2023-06-09 NOTE — Assessment & Plan Note (Signed)
C-Met and lipid profile today Encouraged her to consume a low-fat diet Continue pravastatin and aspirin

## 2023-06-09 NOTE — Addendum Note (Signed)
Addended by: Lorre Munroe on: 06/09/2023 09:35 AM   Modules accepted: Level of Service

## 2023-06-09 NOTE — Progress Notes (Signed)
[facility] virtual Visit via Video Note  I connected with Shannon Bryan on 06/09/23 at  8:00 AM EDT by a video enabled telemedicine application and verified that I am speaking with the correct person using two identifiers.  Location: Patient: Office Provider: Home  Persons participating in this video call: Nicki Reaper NP and Shannon Bryan   I discussed the limitations of evaluation and management by telemedicine and the availability of in person appointments. The patient expressed understanding and agreed to proceed.  History of Present Illness:  Patient due for follow-up of chronic conditions.  HTN: Her BP today is 134/76.  She is taking amlodipine, carvedilol, HCTZ, spironolactone and potassium as prescribed.  ECG from 05/2023 reviewed.  DM2: Her last A1c was 9.1%, 01/2022.  She is not taking any oral diabetic medication at this time.  She does not check her sugars.  She does not check her feet routinely.  Her last eye exam was a few months ago- [facility].  Flu never.  Pneumovax never.  COVID never.  HLD with aortic atherosclerosis: Her last LDL was 136, triglycerides 121, 03/2022.  She denies myalgias on pravastatin. She is taking aspirin as well.  She does not consume a low-fat diet.  OSA: She averages 8 hours of sleep per night without the use of her CPAP.  Sleep study from 07/2021 reviewed.  OA: Mainly in her back and knees.  She takes ibuprofen as needed with some relief of symptoms.  She follows with orthopedics. She is about to start physical therapy.   Past Medical History:  Diagnosis Date   Asthma    Blood clot in vein    Chronic kidney disease    Diabetes mellitus without complication (HCC)    Hyperlipidemia    Hypertension    Kidney stone    Sleep apnea     Current Outpatient Medications  Medication Sig Dispense Refill   acetaminophen (TYLENOL) 500 MG tablet Take 500 mg by mouth every 6 (six) hours as needed.     albuterol (VENTOLIN HFA) 108 (90 Base)  MCG/ACT inhaler SMARTSIG:2 Puff(s) By Mouth Every 6 Hours PRN     amLODipine (NORVASC) 5 MG tablet Take 1 tablet (5 mg total) by mouth daily. 90 tablet 0   aspirin EC 81 MG tablet Take 81 mg by mouth daily.     benzonatate (TESSALON PERLES) 100 MG capsule Take 1 capsule (100 mg total) by mouth 3 (three) times daily as needed for cough. 30 capsule 0   carvedilol (COREG) 6.25 MG tablet Take 1 tablet (6.25 mg total) by mouth 2 (two) times daily. 180 tablet 3   cetirizine (ZYRTEC) 10 MG tablet Take 10 mg by mouth daily.     hydrochlorothiazide (HYDRODIURIL) 25 MG tablet Take 1 tablet (25 mg total) by mouth daily. 90 tablet 3   ibuprofen (ADVIL) 800 MG tablet Take 1 tablet by mouth three times daily as needed 90 tablet 0   losartan (COZAAR) 25 MG tablet Take 1 tablet (25 mg total) by mouth daily. 90 tablet 3   Multiple Vitamins-Minerals (ONE-A-DAY 50 PLUS PO) Take 1 tablet by mouth daily.     nystatin cream (MYCOSTATIN) Apply 1 Application topically 3 (three) times daily. 30 g 0   potassium chloride SA (KLOR-CON M20) 20 MEQ tablet Take 1 tablet (20 mEq total) by mouth daily. 30 tablet 0   pravastatin (PRAVACHOL) 10 MG tablet Take 1 tablet (10 mg total) by mouth daily. 30 tablet 2   spironolactone (ALDACTONE)  25 MG tablet Take 1 tablet (25 mg total) by mouth daily. 90 tablet 0   No current facility-administered medications for this visit.    Allergies  Allergen Reactions   Bactrim [Sulfamethoxazole-Trimethoprim] Hives   Penicillins Swelling and Other (See Comments)   Sulfa Antibiotics Other (See Comments)   Cleocin [Clindamycin Hcl] Rash    Family History  Problem Relation Age of Onset   Heart disease Mother    Stroke Mother    Diabetes Mother    Cancer Mother        bone   Depression Mother    Diabetes Father    Emphysema Father     Social History   Socioeconomic History   Marital status: Single    Spouse name: Not on file   Number of children: Not on file   Years of education:  Not on file   Highest education level: Not on file  Occupational History   Not on file  Tobacco Use   Smoking status: Some Days    Current packs/day: 0.00    Average packs/day: 0.3 packs/day for 30.0 years (7.5 ttl pk-yrs)    Types: Cigarettes    Start date: 02/02/1990    Last attempt to quit: 02/03/2020    Years since quitting: 3.3   Smokeless tobacco: Former   Tobacco comments:    1/4 - 1/2 ppd had quit for 1 year in 2010  Vaping Use   Vaping status: Some Days   Devices: Vaping  Substance and Sexual Activity   Alcohol use: Yes    Alcohol/week: 1.0 standard drink of alcohol    Types: 1 Glasses of wine per week    Comment: occasional   Drug use: No   Sexual activity: Never  Other Topics Concern   Not on file  Social History Narrative   Not on file   Social Determinants of Health   Financial Resource Strain: Not on file  Food Insecurity: Not on file  Transportation Needs: Not on file  Physical Activity: Not on file  Stress: Not on file  Social Connections: Not on file  Intimate Partner Violence: Not on file     Constitutional: Denies fever, malaise, fatigue, headache or abrupt weight changes.  HEENT: Denies eye pain, eye redness, ear pain, ringing in the ears, wax buildup, runny nose, nasal congestion, bloody nose, or sore throat. Respiratory: Denies difficulty breathing, shortness of breath, cough or sputum production.   Cardiovascular: Pt reports palpitations. Denies chest pain, chest tightness, or swelling in the hands or feet.  Gastrointestinal: Denies abdominal pain, bloating, constipation, diarrhea or blood in the stool.  GU: Denies urgency, frequency, pain with urination, burning sensation, blood in urine, odor or discharge. Musculoskeletal: Patient reports knee, hip and back pain.  Denies decrease in range of motion, difficulty with gait, muscle pain or joint swelling.  Skin: Denies redness, rashes, lesions or ulcercations.  Neurological: Denies dizziness,  difficulty with memory, difficulty with speech or problems with balance and coordination.  Psych: Pt reports stress. Denies anxiety, depression, SI/HI.  No other specific complaints in a complete review of systems (except as listed in HPI above).    Observations/Objective:  BP 134/76 (BP Location: Left Arm, Patient Position: Sitting, Cuff Size: Large)   Pulse 85   Temp (!) 96.4 F (35.8 C) (Temporal)   Ht 5\' 9"  (1.753 m)   Wt 298 lb (135.2 kg)   SpO2 97%   BMI 44.01 kg/m   Wt Readings from Last 3 Encounters:  05/31/23 296 lb 9.6 oz (134.5 kg)  03/31/23 (!) 302 lb 12.8 oz (137.3 kg)  01/18/23 300 lb (136.1 kg)    General: Appears her stated age, obese, in NAD. Skin: No ulcerations reported. HEENT: Head: normal shape and size; Eyes: EOMs intact;  CV: Normal rate. Wearing zio patch. Pulmonary/Chest: Normal effort. No respiratory distress.  Musculoskeletal: Gait not visualized. Neurological: Alert and oriented. Coordination normal.  Psychiatric: Mood and affect normal. Behavior is normal. Judgment and thought content normal.     BMET    Component Value Date/Time   NA 138 03/31/2023 1049   NA 141 06/11/2021 0927   K 3.8 03/31/2023 1049   CL 105 03/31/2023 1049   CO2 25 03/31/2023 1049   GLUCOSE 178 (H) 03/31/2023 1049   BUN 8 03/31/2023 1049   BUN 12 06/11/2021 0927   CREATININE 0.81 03/31/2023 1049   CREATININE 0.92 01/05/2019 1101   CALCIUM 8.9 03/31/2023 1049   GFRNONAA >60 03/31/2023 1049   GFRNONAA 70 01/05/2019 1101   GFRAA >60 06/20/2020 1936   GFRAA 81 01/05/2019 1101    Lipid Panel     Component Value Date/Time   CHOL 197 03/15/2022 0948   TRIG 121 03/15/2022 0948   HDL 38 (L) 03/15/2022 0948   CHOLHDL 5.2 (H) 03/15/2022 0948   VLDL 20 03/29/2017 0833   LDLCALC 136 (H) 03/15/2022 0948    CBC    Component Value Date/Time   WBC 10.6 (H) 01/18/2023 0814   RBC 5.17 (H) 01/18/2023 0814   HGB 13.8 01/18/2023 0814   HCT 43.3 01/18/2023 0814   PLT  287 01/18/2023 0814   MCV 83.8 01/18/2023 0814   MCH 26.7 01/18/2023 0814   MCHC 31.9 01/18/2023 0814   RDW 15.2 01/18/2023 0814   LYMPHSABS 2.8 01/18/2023 0814   MONOABS 0.7 01/18/2023 0814   EOSABS 0.1 01/18/2023 0814   BASOSABS 0.0 01/18/2023 0814    Hgb A1C Lab Results  Component Value Date   HGBA1C 9.1 (H) 02/25/2022       Assessment and Plan:  RTC in 3 months for your annual exam  Follow Up Instructions:    I discussed the assessment and treatment plan with the patient. The patient was provided an opportunity to ask questions and all were answered. The patient agreed with the plan and demonstrated an understanding of the instructions.   The patient was advised to call back or seek an in-person evaluation if the symptoms worsen or if the condition fails to improve as anticipated.    Nicki Reaper, NP

## 2023-06-09 NOTE — Assessment & Plan Note (Signed)
C-Met and lipid profile today Encouraged her to consume a low-fat diet Continue pravastatin

## 2023-06-09 NOTE — Assessment & Plan Note (Signed)
POCT A1c 8.7% We will check urine microalbumin today Encouraged her to consume a low-carb diet and exercise weight loss Rx for metformin 1000 mg twice daily Rx for Ozempic 0.25 mg weekly then increase to 0.5 mg weekly thereafter Will request copy of eye exam Encouraged routine foot exam Encouraged her to get a flu shot in the fall Pneumovax today Encouraged her to get her COVID vaccines

## 2023-06-09 NOTE — Assessment & Plan Note (Signed)
Controlled on amlodipine, carvedilol, HCTZ, spironolactone and potassium Reinforced DASH diet and exercise for weight loss C-Met today

## 2023-06-09 NOTE — Assessment & Plan Note (Signed)
Encourage weight loss as this can help reduce sleep apnea symptoms Encourage CPAP use 

## 2023-06-09 NOTE — Assessment & Plan Note (Signed)
Encouraged diet and exercise for weight loss We will start Ozempic to help with weight loss

## 2023-06-10 ENCOUNTER — Telehealth: Payer: Self-pay | Admitting: Internal Medicine

## 2023-06-10 MED ORDER — ATORVASTATIN CALCIUM 20 MG PO TABS: 20.0000 mg | ORAL_TABLET | Freq: Every day | ORAL | 0 refills | Status: DC

## 2023-06-10 NOTE — Telephone Encounter (Signed)
Atorvastatin sent to pharmacy.  PA will be for [facility].

## 2023-06-10 NOTE — Addendum Note (Signed)
Addended by: Lorre Munroe on: 06/10/2023 02:52 PM   Modules accepted: Orders

## 2023-06-10 NOTE — Telephone Encounter (Signed)
Patient called sttd she is fine with taking the Atorvastatin that was discussed and insurance is waiting on the authorization papers. Please f/u with patient

## 2023-06-14 ENCOUNTER — Ambulatory Visit: Payer: Self-pay | Admitting: Internal Medicine

## 2023-06-23 ENCOUNTER — Other Ambulatory Visit: Payer: Self-pay | Admitting: Internal Medicine

## 2023-06-23 ENCOUNTER — Ambulatory Visit
Admission: RE | Admit: 2023-06-23 | Discharge: 2023-06-23 | Disposition: A | Discharge status: Home / Self Care | Payer: BC Managed Care – PPO | Source: Ambulatory Visit | Attending: Internal Medicine | Admitting: Internal Medicine

## 2023-06-23 DIAGNOSIS — N6310 Unspecified lump in the right breast, unspecified quadrant: Secondary | ICD-10-CM

## 2023-06-23 DIAGNOSIS — R002 Palpitations: Secondary | ICD-10-CM | POA: Diagnosis not present

## 2023-06-24 ENCOUNTER — Encounter: Payer: Self-pay | Admitting: Internal Medicine

## 2023-06-27 NOTE — Progress Notes (Signed)
Average heart rate 76 beats per minute. 2 episodes of fast heart rates. Rare early beats from the top or bottom of the heart. Will discuss possible medication changes at follow-up.

## 2023-07-06 ENCOUNTER — Other Ambulatory Visit (HOSPITAL_BASED_OUTPATIENT_CLINIC_OR_DEPARTMENT_OTHER): Payer: Self-pay

## 2023-07-06 ENCOUNTER — Ambulatory Visit (INDEPENDENT_AMBULATORY_CARE_PROVIDER_SITE_OTHER): Payer: BC Managed Care – PPO | Admitting: Orthopaedic Surgery

## 2023-07-06 ENCOUNTER — Other Ambulatory Visit (HOSPITAL_BASED_OUTPATIENT_CLINIC_OR_DEPARTMENT_OTHER): Payer: Self-pay | Admitting: Orthopaedic Surgery

## 2023-07-06 ENCOUNTER — Ambulatory Visit (HOSPITAL_BASED_OUTPATIENT_CLINIC_OR_DEPARTMENT_OTHER): Payer: BC Managed Care – PPO

## 2023-07-06 DIAGNOSIS — M25552 Pain in left hip: Secondary | ICD-10-CM

## 2023-07-06 DIAGNOSIS — M25551 Pain in right hip: Secondary | ICD-10-CM

## 2023-07-06 DIAGNOSIS — M160 Bilateral primary osteoarthritis of hip: Secondary | ICD-10-CM | POA: Diagnosis not present

## 2023-07-06 MED ORDER — LIDOCAINE HCL 1 % IJ SOLN
4.0000 mL | INTRAMUSCULAR | Status: AC | PRN
Start: 2023-07-06 — End: 2023-07-06
  Administered 2023-07-06: 4 mL

## 2023-07-06 MED ORDER — METHOCARBAMOL 500 MG PO TABS: 500.0000 mg | ORAL_TABLET | Freq: Four times a day (QID) | ORAL | 3 refills | Status: DC

## 2023-07-06 MED ORDER — TRIAMCINOLONE ACETONIDE 40 MG/ML IJ SUSP
80.0000 mg | INTRAMUSCULAR | Status: AC | PRN
Start: 2023-07-06 — End: 2023-07-06
  Administered 2023-07-06: 80 mg via INTRA_ARTICULAR

## 2023-07-06 NOTE — Progress Notes (Signed)
Chief Complaint: Bilateral knee pain     History of Present Illness:   07/06/2023: Today for bilateral gluteus medius injections that she is having persistent lateral based trochanteric pain and cannot lay on either side.  Shannon Bryan is a 62 y.o. female presents with bilateral knee pain which has been ongoing now for many months.  She do not have specific injury or incident.  She is also complaining of predominantly bilateral hip pain as well.  She states that she has a harder time sitting for longer periods of time with a deep aching  pain in the knee.  She is here today with her daughter for further discussion.    Surgical History:   None  PMH/PSH/Family History/Social History/Meds/Allergies:    Past Medical History:  Diagnosis Date  . Asthma   . Blood clot in vein   . Chronic kidney disease   . Diabetes mellitus without complication (HCC)   . Hyperlipidemia   . Hypertension   . Kidney stone   . Sleep apnea    Past Surgical History:  Procedure Laterality Date  . ABDOMINAL HYSTERECTOMY    . ABLATION    . BREAST RECONSTRUCTION WITH MASTOPLASTY Bilateral 03/11/2020   Procedure: BREAST RECONSTRUCTION WITH MASTOPLASTY;  Surgeon: Contogiannis, Chales Abrahams, MD;  Location: [facility];  Service: Plastics;  Laterality: Bilateral;  . CHOLECYSTECTOMY    . COLONOSCOPY WITH PROPOFOL N/A 04/19/2022   Procedure: COLONOSCOPY WITH PROPOFOL;  Surgeon: Toney Reil, MD;  Location: [facility];  Service: Gastroenterology;  Laterality: N/A;  . REDUCTION MAMMAPLASTY Bilateral    4 yrs ago  . ROTATOR CUFF REPAIR Right   . TUBAL LIGATION     Social History   Socioeconomic History  . Marital status: Single    Spouse name: Not on file  . Number of children: Not on file  . Years of education: Not on file  . Highest education level: Not on file  Occupational History  . Not on file  Tobacco Use  . Smoking status: Some Days     Current packs/day: 0.00    Average packs/day: 0.3 packs/day for 30.0 years (7.5 ttl pk-yrs)    Types: Cigarettes    Start date: 02/02/1990    Last attempt to quit: 02/03/2020    Years since quitting: 3.4  . Smokeless tobacco: Former  . Tobacco comments:    1/4 - 1/2 ppd had quit for 1 year in 2010  Vaping Use  . Vaping status: Some Days  . Devices: Vaping  Substance and Sexual Activity  . Alcohol use: Yes    Alcohol/week: 1.0 standard drink of alcohol    Types: 1 Glasses of wine per week    Comment: occasional  . Drug use: No  . Sexual activity: Never  Other Topics Concern  . Not on file  Social History Narrative  . Not on file   Social Determinants of Health   Financial Resource Strain: Not on file  Food Insecurity: Not on file  Transportation Needs: Not on file  Physical Activity: Not on file  Stress: Not on file  Social Connections: Not on file   Family History  Problem Relation Age of Onset  . Heart disease Mother   . Stroke Mother   . Diabetes Mother   . Cancer Mother  bone  . Depression Mother   . Diabetes Father   . Emphysema Father    Allergies  Allergen Reactions  . Bactrim [Sulfamethoxazole-Trimethoprim] Hives  . Penicillins Swelling and Other (See Comments)  . Sulfa Antibiotics Other (See Comments)  . Cleocin [Clindamycin Hcl] Rash   Current Outpatient Medications  Medication Sig Dispense Refill  . methocarbamol (ROBAXIN) 500 MG tablet Take 1 tablet (500 mg total) by mouth 4 (four) times daily. 30 tablet 3  . acetaminophen (TYLENOL) 500 MG tablet Take 500 mg by mouth every 6 (six) hours as needed.    Marland Kitchen albuterol (VENTOLIN HFA) 108 (90 Base) MCG/ACT inhaler SMARTSIG:2 Puff(s) By Mouth Every 6 Hours PRN    . amLODipine (NORVASC) 5 MG tablet Take 1 tablet (5 mg total) by mouth daily. 90 tablet 0  . aspirin EC 81 MG tablet Take 81 mg by mouth daily.    Marland Kitchen atorvastatin (LIPITOR) 20 MG tablet Take 1 tablet (20 mg total) by mouth daily. 90 tablet 0  .  carvedilol (COREG) 6.25 MG tablet Take 1 tablet (6.25 mg total) by mouth 2 (two) times daily. 180 tablet 3  . cetirizine (ZYRTEC) 10 MG tablet Take 10 mg by mouth daily.    . hydrochlorothiazide (HYDRODIURIL) 25 MG tablet Take 1 tablet (25 mg total) by mouth daily. 90 tablet 3  . ibuprofen (ADVIL) 800 MG tablet Take 1 tablet by mouth three times daily as needed 90 tablet 0  . losartan (COZAAR) 25 MG tablet Take 1 tablet (25 mg total) by mouth daily. 90 tablet 3  . metFORMIN (GLUCOPHAGE) 1000 MG tablet Take 1 tablet (1,000 mg total) by mouth 2 (two) times daily with a meal. 180 tablet 0  . Multiple Vitamins-Minerals (ONE-A-DAY 50 PLUS PO) Take 1 tablet by mouth daily.    Marland Kitchen nystatin cream (MYCOSTATIN) Apply 1 Application topically 3 (three) times daily. 30 g 0  . OZEMPIC, 0.25 OR 0.5 MG/DOSE, 2 MG/3ML SOPN Inject 0.5 mg into the skin once a week. Start with 0.25mg  weekly x 4 weeks then increase to 0.5mg  weekly injection. 9 mL 0  . potassium chloride SA (KLOR-CON M20) 20 MEQ tablet Take 1 tablet (20 mEq total) by mouth daily. 30 tablet 0  . spironolactone (ALDACTONE) 25 MG tablet Take 1 tablet (25 mg total) by mouth daily. 90 tablet 0   No current facility-administered medications for this visit.   No results found.  Review of Systems:   A ROS was performed including pertinent positives and negatives as documented in the HPI.  Physical Exam :   Constitutional: NAD and appears stated age Neurological: Alert and oriented Psych: Appropriate affect and cooperative There were no vitals taken for this visit.   Comprehensive Musculoskeletal Exam:    Tenderness palpation about bilateral patellofemoral joints with positive patellar grind.  Range of motion is from 0 to 120 degrees.  Remainder of distal neurosensory exam is intact  Positive bilateral tenderness over greater trochanter with weakness with resisted abduction  Imaging:   Xray (4 views right knee, 4 views left knee): Normal  X-rays  pelvis, right hip 2 views, left hip 2 views: Normal  I personally reviewed and interpreted the radiographs.   Assessment:   62 y.o. female with evidence of bilateral patellofemoral type pain syndrome.  She does have evidence of bilateral gluteus medius tendinopathy with pain about the trochanters of both hips.  At today's visit she is requesting bilateral ultrasound-guided injections.  I would like her to work with  physical therapy for hip and knee strengthening program  Plan :    -Return to clinic should had her injections wear off     Procedure Note  Patient: Shannon Bryan             Date of Birth: [birth year]           MRN: [id number]             Visit Date: 07/06/2023  Procedures: Visit Diagnoses:  1. Pain in left hip   2. Pain in right hip     Large Joint Inj: R greater trochanter on 07/06/2023 5:22 PM Indications: pain Details: 22 G 3.5 in needle, ultrasound-guided anterolateral approach  Arthrogram: No  Medications: 4 mL lidocaine 1 %; 80 mg triamcinolone acetonide 40 MG/ML Outcome: tolerated well, no immediate complications Procedure, treatment alternatives, risks and benefits explained, specific risks discussed. Consent was given by the patient. Immediately prior to procedure a time out was called to verify the correct patient, procedure, equipment, support staff and site/side marked as required. Patient was prepped and draped in the usual sterile fashion.    Large Joint Inj: L greater trochanter on 07/06/2023 5:22 PM Indications: pain Details: 22 G 3.5 in needle, ultrasound-guided anterolateral approach  Arthrogram: No  Medications: 4 mL lidocaine 1 %; 80 mg triamcinolone acetonide 40 MG/ML Outcome: tolerated well, no immediate complications Procedure, treatment alternatives, risks and benefits explained, specific risks discussed. Consent was given by the patient. Immediately prior to procedure a time out was called to verify the correct patient, procedure,  equipment, support staff and site/side marked as required. Patient was prepped and draped in the usual sterile fashion.       I personally saw and evaluated the patient, and participated in the management and treatment plan.  Huel Cote, MD Attending Physician, Orthopedic Surgery  This document was dictated using Dragon voice recognition software. A reasonable attempt at proof reading has been made to minimize errors.

## 2023-07-07 DIAGNOSIS — G4733 Obstructive sleep apnea (adult) (pediatric): Secondary | ICD-10-CM | POA: Diagnosis not present

## 2023-07-08 ENCOUNTER — Ambulatory Visit
Admission: RE | Admit: 2023-07-08 | Discharge: 2023-07-08 | Disposition: A | Discharge status: Home / Self Care | Payer: BC Managed Care – PPO | Source: Ambulatory Visit | Attending: Internal Medicine | Admitting: Internal Medicine

## 2023-07-08 ENCOUNTER — Other Ambulatory Visit: Payer: BC Managed Care – PPO

## 2023-07-08 DIAGNOSIS — R921 Mammographic calcification found on diagnostic imaging of breast: Secondary | ICD-10-CM | POA: Diagnosis not present

## 2023-07-08 DIAGNOSIS — N6310 Unspecified lump in the right breast, unspecified quadrant: Secondary | ICD-10-CM

## 2023-07-08 DIAGNOSIS — N6002 Solitary cyst of left breast: Secondary | ICD-10-CM | POA: Diagnosis not present

## 2023-07-08 DIAGNOSIS — N6001 Solitary cyst of right breast: Secondary | ICD-10-CM | POA: Diagnosis not present

## 2023-07-11 NOTE — Group Note (Deleted)

## 2023-07-12 ENCOUNTER — Encounter: Payer: Self-pay | Admitting: Cardiology

## 2023-07-12 ENCOUNTER — Ambulatory Visit: Payer: BC Managed Care – PPO | Attending: Cardiology | Admitting: Cardiology

## 2023-07-12 VITALS — BP 132/60 | HR 85 | Ht 69.0 in | Wt 291.6 lb

## 2023-07-12 DIAGNOSIS — I10 Essential (primary) hypertension: Secondary | ICD-10-CM

## 2023-07-12 DIAGNOSIS — G4733 Obstructive sleep apnea (adult) (pediatric): Secondary | ICD-10-CM

## 2023-07-12 DIAGNOSIS — Z7984 Long term (current) use of oral hypoglycemic drugs: Secondary | ICD-10-CM

## 2023-07-12 DIAGNOSIS — R0602 Shortness of breath: Secondary | ICD-10-CM

## 2023-07-12 DIAGNOSIS — E1165 Type 2 diabetes mellitus with hyperglycemia: Secondary | ICD-10-CM

## 2023-07-12 DIAGNOSIS — E782 Mixed hyperlipidemia: Secondary | ICD-10-CM

## 2023-07-12 DIAGNOSIS — R002 Palpitations: Secondary | ICD-10-CM | POA: Diagnosis not present

## 2023-07-12 MED ORDER — POTASSIUM CHLORIDE CRYS ER 20 MEQ PO TBCR: 20.0000 meq | EXTENDED_RELEASE_TABLET | Freq: Every day | ORAL | 3 refills | Status: AC

## 2023-07-12 NOTE — Progress Notes (Signed)
Cardiology Office Note:  .   Date:  07/12/2023  ID:  Shannon Bryan, DOB [birth year], MRN [id number] PCP: Lorre Munroe, NP  [facility] Providers Cardiologist:  Lorine Bears, MD Sleep Medicine:  Armanda Magic, MD    History of Present Illness: .   Shannon Bryan is a 62 y.o. female with a past medical history of essential hypertension, hyperlipidemia, tobacco use, and obesity, who is here today for follow-up.  She was originally seen in March 2020 for worsening exertional dyspnea and chest pain.  She had uncontrolled hypertension with blood pressures of 200s systolic.  At that time she was on HCTZ 25 mg daily and started on carvedilol 625 mg twice daily.  Lexiscan MPI showed fixed anterior wall defect likely due to breast attenuation.  Ejection fraction on echocardiogram was 55-60%, no significant valvular abnormalities or pulmonary hypertension.  With worsening shortness of breath and chest pain she was seen in July 2022 and underwent repeat echocardiogram which revealed normal LV systolic function with no significant valvular abnormalities.  She also underwent cardiac CT in August 2022 which showed a calcium score of 0 with no evidence of obstructive coronary artery disease.  Home sleep study confirms sleep apnea.  She also stopped smoking within the year before.  She was last seen in clinic 05/21/2023 with complaints of palpitations and pounding heart heart rates.  She denied any chest pain or worsening shortness of breath.  She had not had any visits to the hospital or the emergency department.  Her last hemoglobin A1c was noted to be 9.1 today inability for her to prick her finger.  She was to be placed on a ZIO XT monitor for 2 weeks to rule out arrhythmia.  She was also started on losartan 25 mg daily for blood pressure with a repeat BMP in 2 weeks.  She returns to clinic today stating that overall she has been feeling well.She had one day of dizziness and sweating with a blood  pressure at work noted to be 118 systolic.  Since that time she states her blood pressures were moving very well-controlled on her new medications.  Blood work was stable.  She did note today that she had had some dizziness as she is just recently taken a Robaxin.  She states she has been compliant with her medications.  Denies any hospitalizations or visits to the emergency department.  Continues to work on increasing her activity with walking the dog and has made multiple dietary changes.  ROS: 10 point review of systems has been reviewed and considered negative with exception of what is listed in HPI  Studies Reviewed: .       Heart Monitor 06/24/23 Patient had a min HR of 49 bpm, max HR of 165 bpm, and avg HR of 76 bpm. Predominant underlying rhythm was Sinus Rhythm.  2 Supraventricular Tachycardia runs occurred, the run with the fastest interval lasting 4 beats with a max rate of 152 bpm, the longest lasting 22.4 secs with an avg rate of 121 bpm. Supraventricular Tachycardia was detected within +/- 45 seconds of symptomatic patient event(s).  Rare PACs and rare PVCs. Many triggered events did not correlate with arrhythmia.  TTE 05/24/23 1. Left ventricular ejection fraction, by estimation, is 60 to 65%. Left  ventricular ejection fraction by PLAX is 58 %. The left ventricle has  normal function. The left ventricle has no regional wall motion  abnormalities. There is mild left ventricular  hypertrophy. Left ventricular diastolic parameters are  consistent with  Grade I diastolic dysfunction (impaired relaxation).   2. Right ventricular systolic function is normal. The right ventricular  size is normal.   3. The mitral valve is normal in structure. No evidence of mitral valve  regurgitation. No evidence of mitral stenosis.   4. The aortic valve has an indeterminant number of cusps. Aortic valve  regurgitation is mild. No aortic stenosis is present.   5. The inferior vena cava is normal in  size with greater than 50%  respiratory variability, suggesting right atrial pressure of 3 mmHg.    Coronary CTA 06/18/21 IMPRESSION: 1. Normal coronary calcium score of 0. Patient is low risk for coronary events.   2. Normal coronary origin with left dominance.   3. No evidence of CAD.   4. CAD-RADS 0. Consider non-atherosclerotic causes of chest pain.   TTE 06/02/21 1. Left ventricular ejection fraction, by estimation, is 55 to 60%. The  left ventricle has normal function. The left ventricle has no regional  wall motion abnormalities. Left ventricular diastolic parameters are  indeterminate. The average left  ventricular global longitudinal strain is -18.6 %. The global longitudinal  strain is normal.   2. Right ventricular systolic function is normal. The right ventricular  size is normal.   3. The mitral valve is abnormal. Trivial mitral valve regurgitation. No  evidence of mitral stenosis.   4. The aortic valve is tricuspid. Aortic valve regurgitation is mild. No  aortic stenosis is present.  Risk Assessment/Calculations:       Physical Exam:   VS:  BP 132/60 (BP Location: Left Arm, Patient Position: Sitting, Cuff Size: Large)   Pulse 85   Ht 5\' 9"  (1.753 m)   Wt 291 lb 9.6 oz (132.3 kg)   SpO2 99%   BMI 43.06 kg/m    Wt Readings from Last 3 Encounters:  07/12/23 291 lb 9.6 oz (132.3 kg)  06/09/23 298 lb (135.2 kg)  05/31/23 296 lb 9.6 oz (134.5 kg)    GEN: Well nourished, well developed in no acute distress NECK: No JVD; No carotid bruits CARDIAC: RRR, no murmurs, rubs, gallops RESPIRATORY:  Clear to auscultation without rales, wheezing or rhonchi  ABDOMEN: Soft, non-tender, non-distended EXTREMITIES:  No edema; No deformity   ASSESSMENT AND PLAN: .   Palpitations that have improved with decreasing caffeine intake.  ZIO XT monitor revealed an average heart rate of 76 bpm with predominantly underlying sinus rhythm with 2 episodes of SVT with rare PACs and rare  PVCs.  Did discuss that if she has continued elevations in her heart rate or continued palpitations we can increase her carvedilol from 6.25 mg twice daily to 12.5 mg twice daily.  Chronic shortness of breath and dyspnea on exertion with a coronary CTA revealed a coronary calcium score of 0.  Echocardiogram revealed an LVEF of 60 to 65%, no regional wall motion abnormalities, G1 DD, and mild aortic regurgitation.  Mild to factorial from blood pressure, obesity, and dietary indiscretions.  Primary hypertension with blood pressure today 132/60.  She is continued on amlodipine 5 mg daily, carvedilol 6.25 mg twice daily, HCTZ 25 mg daily losartan 25 mg daily, and losartan 25 mg daily.  Blood pressure is better controlled.  She has been encouraged to continue to monitor pressures 1 to 2 hours post medication administration as well.  Mixed hyperlipidemia with aortic atherosclerosis with a last LDL of 128 on 06/09/2023.  Is an improvement from her recent panel with an LDL of 136.  She is continued on atorvastatin 20 mg daily with the blood work that is scheduled with her PCP.  Type 2 diabetes with last hemoglobin A1c of 9.1.  She is continued on metformin.  Unfortunately insurance is not covering Ozempic or majora no and she has been without either of those medications.  This continues to be managed by her PCP.  Morbid obesity with a BMI of 43.06.  She has lost 5 pounds since her last visit.  She is encouraged to continue to increase her activity and decrease her caloric intake.  Obstructive sleep apnea where she states she is continue to be compliant with nightly CPAP.       Dispo: Patient return to clinic to see MD/APP in 3 months or sooner if needed  Signed, Toshio Slusher, NP

## 2023-07-12 NOTE — Patient Instructions (Signed)
Medication Instructions:  Your physician recommends that you continue on your current medications as directed. Please refer to the Current Medication list given to you today.  *If you need a refill on your cardiac medications before your next appointment, please call your pharmacy*  Lab Work: -None ordered  Testing/Procedures: -None ordered  Follow-Up: At [facility], you and your health needs are our priority.  As part of our continuing mission to provide you with exceptional heart care, we have created designated Provider Care Teams.  These Care Teams include your primary Cardiologist (physician) and Advanced Practice Providers (APPs -  Physician Assistants and Nurse Practitioners) who all work together to provide you with the care you need, when you need it.  Your next appointment:   3 month(s)  Provider:   You may see Lorine Bears, MD or one of the following Advanced Practice Providers on your designated Care Team:   Nicolasa Ducking, NP Eula Listen, PA-C Cadence Fransico Michael, PA-C Charlsie Quest, NP    Other Instructions -None

## 2023-07-27 ENCOUNTER — Telehealth: Payer: Self-pay | Admitting: Orthopaedic Surgery

## 2023-07-27 ENCOUNTER — Other Ambulatory Visit (HOSPITAL_BASED_OUTPATIENT_CLINIC_OR_DEPARTMENT_OTHER): Payer: Self-pay | Admitting: Orthopaedic Surgery

## 2023-07-27 DIAGNOSIS — M25551 Pain in right hip: Secondary | ICD-10-CM

## 2023-07-27 NOTE — Telephone Encounter (Signed)
[facility] ordered for [facility] for [facility] hip strengthening program

## 2023-07-27 NOTE — Telephone Encounter (Signed)
Patient called. She would like a referral for PT. Her R hip is hurting. She would like PT in [place]. Her cb# [phone]

## 2023-08-01 ENCOUNTER — Ambulatory Visit: Payer: BC Managed Care – PPO

## 2023-08-03 ENCOUNTER — Ambulatory Visit: Payer: BC Managed Care – PPO

## 2023-08-06 DIAGNOSIS — G4733 Obstructive sleep apnea (adult) (pediatric): Secondary | ICD-10-CM | POA: Diagnosis not present

## 2023-08-08 ENCOUNTER — Ambulatory Visit: Payer: BC Managed Care – PPO | Attending: Orthopaedic Surgery

## 2023-08-08 DIAGNOSIS — M5459 Other low back pain: Secondary | ICD-10-CM | POA: Insufficient documentation

## 2023-08-08 DIAGNOSIS — M25551 Pain in right hip: Secondary | ICD-10-CM | POA: Diagnosis not present

## 2023-08-08 DIAGNOSIS — R2689 Other abnormalities of gait and mobility: Secondary | ICD-10-CM | POA: Insufficient documentation

## 2023-08-08 DIAGNOSIS — G8929 Other chronic pain: Secondary | ICD-10-CM | POA: Insufficient documentation

## 2023-08-08 DIAGNOSIS — M25561 Pain in right knee: Secondary | ICD-10-CM | POA: Insufficient documentation

## 2023-08-08 DIAGNOSIS — M25562 Pain in left knee: Secondary | ICD-10-CM | POA: Diagnosis not present

## 2023-08-08 DIAGNOSIS — M25552 Pain in left hip: Secondary | ICD-10-CM | POA: Insufficient documentation

## 2023-08-08 NOTE — Therapy (Signed)
OUTPATIENT PHYSICAL THERAPY THORACOLUMBAR EVALUATION   Patient Name: Shannon Bryan MRN: [id number] DOB:[birth year], 62 y.o., female Today's Date: 08/08/2023  END OF SESSION:  PT End of Session - 08/08/23 1301     Visit Number 1    Number of Visits 17    Date for PT Re-Evaluation 10/07/23    PT Start Time 1301    PT Stop Time 1348    PT Time Calculation (min) 47 min    Activity Tolerance Patient tolerated treatment well;Patient limited by pain    Behavior During Therapy WFL for tasks assessed/performed             Past Medical History:  Diagnosis Date   Asthma    Blood clot in vein    Chronic kidney disease    Diabetes mellitus without complication (HCC)    Hyperlipidemia    Hypertension    Kidney stone    Sleep apnea    Past Surgical History:  Procedure Laterality Date   ABDOMINAL HYSTERECTOMY     ABLATION     BREAST RECONSTRUCTION WITH MASTOPLASTY Bilateral 03/11/2020   Procedure: BREAST RECONSTRUCTION WITH MASTOPLASTY;  Surgeon: Eloise Levels, MD;  Location: [facility];  Service: Plastics;  Laterality: Bilateral;   CHOLECYSTECTOMY     COLONOSCOPY WITH PROPOFOL N/A 04/19/2022   Procedure: COLONOSCOPY WITH PROPOFOL;  Surgeon: Toney Reil, MD;  Location: [facility] END[facility]COPY;  Service: Gastroenterology;  Laterality: N/A;   REDUCTION MAMMAPLASTY Bilateral    4 yrs ago   ROTATOR CUFF REPAIR Right    TUBAL LIGATION     Patient Active Problem List   Diagnosis Date Noted   OSA (obstructive sleep apnea) 06/09/2023   Aortic atherosclerosis (HCC) 03/15/2022   Hyperlipidemia associated with type 2 diabetes mellitus (HCC) 03/15/2022   Type 2 diabetes mellitus with hyperglycemia, without long-term current use of insulin (HCC) 03/15/2022   Osteoarthritis 03/15/2022   Class 3 severe obesity due to excess calories with body mass index (BMI) of 40.0 to 44.9 in adult [facility]) 03/15/2022   Essential hypertension 12/22/2017    PCP: Lorre Munroe,  NP   REFERRING PROVIDER: Huel Cote, MD  REFERRING DIAG: [phone] (ICD-10-CM) - Pain in right hip  Rationale for Evaluation and Treatment: Rehabilitation  THERAPY DIAG:  Pain in right hip - Plan: PT plan of care cert/re-cert  Pain in left hip - Plan: PT plan of care cert/re-cert  Other low back pain - Plan: PT plan of care cert/re-cert  ONSET DATE: years ago  SUBJECTIVE:  SUBJECTIVE STATEMENT: Pt had Covid last week. Lost her voice.  R hip (lateral): 1/10 currently (pt sitting on a chair), 10+/10 R hip pain at worst (so bad that just touching the skin hurts)    L hip (lateral): 0/10 currently, 10+/10 at worst for the past 3 months    PERTINENT HISTORY:  (Pt currently has limited voice) B hip pain. R hip pain worse than her L. Pain began gradually, years ago. Pain has gotten worse since onset.  Has had sharp burning pain in her L hip for the past 3 days, would happen, then it would quit. Had B steroid shot a few weeks ago which helped for about 2-3 weeks. However, pain returned with a vengeance. Cannot lay on her back due to sleep apnea (has a CPAP machine as well). 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   No latex allergies   PAIN:  Are you having pain? Yes: NPRS scale: See subjective /10 Pain location: B lateral hips R > L Pain description: burning, swelling that is about to burst, sharp, tender, sore, achy.  Aggravating factors: walking (first 2-3 steps), low back pain plays a factor with walking as well, going up steps, laying on R and L sides, standing up from sitting)  Relieving factors: sitting and leaning forward.   PRECAUTIONS: Other: no known  precuations  RED FLAGS: Bowel or bladder incontinence: No and Cauda equina syndrome: No   WEIGHT BEARING RESTRICTIONS: No  FALLS:  Has patient fallen in last 6 months? No  LIVING ENVIRONMENT: Lives with: lives with their family and lives with their daughter (Daughter and grandson lives with pt ) Lives in: House/apartment Stairs: Yes: External: 20 total  steps; on right going up Has following equipment at home: None  OCCUPATION: LPN  PLOF: Independent  PATIENT GOALS: decrease pain  NEXT MD VISIT: not known  OBJECTIVE:  Note: Objective measures were completed at Evaluation unless otherwise noted.  DIAGNOSTIC FINDINGS:  DG HIP UNILAT WITH PELVIS 2-3 VIEWS RIGHT 07/06/2023  CLINICAL DATA:  Bilateral hip pain.  No known injury.   EXAM: DG HIP (WITH OR WITHOUT PELVIS) 2-3V RIGHT; DG HIP (WITH OR WITHOUT PELVIS) 4+V LEFT   COMPARISON:  None Available.   FINDINGS: Right hip: Mild hip joint space narrowing with acetabular spurring. The femoral head is well seated. No fracture. No erosion, evidence of avascular necrosis or focal bone abnormality.   Left hip: Mild hip joint space narrowing. Slight lateral acetabular spurring. The femoral head is well seated. No fracture. No erosion, evidence of avascular necrosis or focal bone abnormality.   Bony pelvis: No focal bone abnormality or bone destruction. Pubic rami are intact. Pubic symphysis and sacroiliac joints are congruent. Tubal ligation clips in the pelvis.   Soft tissue attenuation from habitus limits detailed assessment.   IMPRESSION: Mild osteoarthritis of both hips.     Electronically Signed   By: Narda Rutherford M.D.   On: 07/17/2023 13:52    PATIENT SURVEYS:  FOTO Hip FOTO 31 (08/08/2023)  SCREENING FOR RED FLAGS: Bowel or bladder incontinence: No  Cauda equina syndrome: No  COGNITION: Overall cognitive status: Within functional limits for tasks assessed     SENSATION:   MUSCLE  LENGTH:   POSTURE:  B foot pronation, R hip ER, R iliac crest and knee higher L lateral shift, movement  preference around L1/L2 area Decreased low back and L1 dermatome pain with L lateral shift correction  Sit to stand: decreased B femoral control, pt demonstrates B genu valgus and B foot pronation.  Wide stance decreases pressure in low back and B hips    PALPATION: TTP R  > L greater trochanters TTP low back (increased sensitivity)    LUMBAR ROM:   AROM eval  Flexion WFL, decreased low back and B lateral hip pain. Low back pain during the return motion; walked up with hands; aberrant movement   Extension WFL, decreased back and no B lateral hip pain  Right lateral flexion WFL with R hip pain  Left lateral flexion WFL with L low back pain  Right rotation WFL with L low back pain  Left rotation WFL with low back pain   (Blank rows = not tested)  LOWER EXTREMITY ROM:     Active  Right eval Left eval  Hip flexion    Hip extension    Hip abduction    Hip adduction    Hip internal rotation    Hip external rotation    Knee flexion    Knee extension    Ankle dorsiflexion    Ankle plantarflexion    Ankle inversion    Ankle eversion     (Blank rows = not tested)  LOWER EXTREMITY MMT:    MMT Right eval Left eval  Hip flexion    Hip extension (seated manually resisted) 3  3  Hip abduction (seated manually resisted) 3+ (with reproduction of R lateral hip pain) 3+  Hip adduction    Hip internal rotation    Hip external rotation    Knee flexion 4- 4-  Knee extension 4 4+  Ankle dorsiflexion    Ankle plantarflexion    Ankle inversion    Ankle eversion     (Blank rows = not tested)     LUMBAR SPECIAL TESTS:    FUNCTIONAL TESTS:    GAIT: Distance walked:  Assistive device utilized: None Level of assistance: Complete Independence Comments:   TODAY'S TREATMENT:                                                                                                                               DATE: 08/08/2023     Therapeutic exercise  Seated transversus abdominis contractoin 10x5 seconds  Seated B glute max contraction 10x5 seconds    Improved exercise technique, movement at target joints, use of target muscles after mod verbal, visual, tactile cues.     PATIENT EDUCATION:  Education details: there-ex, HEP, POC Person educated: Patient Education method: Explanation, Demonstration, Tactile cues, Verbal cues, and Handouts Education comprehension: verbalized understanding and returned demonstration  HOME EXERCISE PROGRAM: Access Code: [email or web address] URL: [email or web address] Date: 08/08/2023 Prepared by: Loralyn Freshwater  Exercises - Seated Transversus Abdominis Bracing  - 5 x daily - 7 x weekly - 3 sets - 10 reps - 5 seconds  hold - Seated Gluteal Sets  - 5 x daily - 7 x weekly - 3 sets - 10 reps - 5 seconds hold  ASSESSMENT:  CLINICAL IMPRESSION: Patient is a 62 y.o. female who was seen today for physical therapy evaluation and treatment for bilateral hip pain. She also demonstrates low back and B knee pain, altered posture, TTP low back and B greater trochanters, trunk and B hip extension and abduction weakness, and difficulty performing closed chain tasks such as ambulation and stair negotiation secondary to pain. Pt will benefit from skilled physical therapy services to address the aforementioned deficits.   OBJECTIVE IMPAIRMENTS: Abnormal gait, difficulty walking, decreased strength, improper body mechanics, postural dysfunction, and pain.   ACTIVITY LIMITATIONS: carrying, lifting, bending, standing, squatting, sleeping, stairs, transfers, and locomotion level  PARTICIPATION LIMITATIONS:   PERSONAL FACTORS: Age, Fitness, Past/current experiences, Profession, Time since onset of injury/illness/exacerbation, and 3+ comorbidities: asthma, chronic kidney disease, DM, HTN, sleep apnea, hysterectomy  are also affecting patient's  functional outcome.   REHAB POTENTIAL: Fair    CLINICAL DECISION MAKING: Evolving/moderate complexity Pt states pain has worsened since onset  EVALUATION COMPLEXITY: Moderate   GOALS: Goals reviewed with patient? Yes  SHORT TERM GOALS: Target date: 08/26/2023   Pt will be independent with her initial HEP to decrease pain, improve strength, function, and ability to ambulate, negotiate stairs as well as sleep on her side more comfortably.  Baseline: Pt has started her initial HEP (08/08/2023) Goal status: INITIAL     LONG TERM GOALS: Target date: 10/07/2023  Pt will have a decrease in R and L lateral hip pain to 5/10 or less at worst to promote ability to ambulate, negotiate stairs, as well as lay on her side more comfortably.  Baseline: 10+/10 R and L lateral hip pain at worst for the past 3 months (08/08/2023) Goal status: INITIAL  2.  Pt will improve her B hip extension and abduction strength by at least 1/2 MMT  to promote ability to ambulate and negotiate stairs, more comfortably.  Baseline:  MMT Right eval Left eval  Hip extension (seated manually resisted) 3  3  Hip abduction (seated manually resisted) 3+ (with reproduction of R lateral hip pain) 3+   Goal status: INITIAL  3.  Pt will improve her hip FOTO score by at least 10 points as a demonstration of improved function.  Baseline: Hip FOTO 31 (08/08/2023) Goal status: INITIAL  PLAN:  PT FREQUENCY: 2x/week  PT DURATION: 8 weeks  PLANNED INTERVENTIONS: Therapeutic exercises, Therapeutic activity, Neuromuscular re-education, Gait training, Patient/Family education, Joint mobilization, Joint manipulation, Stair training, Aquatic Therapy, Dry Needling, Electrical stimulation, Spinal mobilization, Manual therapy, and Re-evaluation.  PLAN FOR NEXT SESSION: Posture, trunk and hip strengthening, lumbopelvic control, manual techniques, modalities PRN   Shepard Keltz, PT, DPT 08/08/2023, 3:38 PM

## 2023-08-10 ENCOUNTER — Ambulatory Visit: Payer: BC Managed Care – PPO

## 2023-08-10 DIAGNOSIS — G8929 Other chronic pain: Secondary | ICD-10-CM | POA: Diagnosis not present

## 2023-08-10 DIAGNOSIS — M25552 Pain in left hip: Secondary | ICD-10-CM | POA: Diagnosis not present

## 2023-08-10 DIAGNOSIS — R2689 Other abnormalities of gait and mobility: Secondary | ICD-10-CM | POA: Diagnosis not present

## 2023-08-10 DIAGNOSIS — M5459 Other low back pain: Secondary | ICD-10-CM

## 2023-08-10 DIAGNOSIS — M25562 Pain in left knee: Secondary | ICD-10-CM | POA: Diagnosis not present

## 2023-08-10 DIAGNOSIS — M25551 Pain in right hip: Secondary | ICD-10-CM | POA: Diagnosis not present

## 2023-08-10 DIAGNOSIS — M25561 Pain in right knee: Secondary | ICD-10-CM | POA: Diagnosis not present

## 2023-08-10 NOTE — Therapy (Signed)
OUTPATIENT PHYSICAL THERAPY  Treatment    Patient Name: Shannon Bryan MRN: [id number] DOB:1961/08/06, 62 y.o., female Today's Date: 08/10/2023  END OF SESSION:  PT End of Session - 08/10/23 1257     Visit Number 2    Number of Visits 17    Date for PT Re-Evaluation 10/07/23    PT Start Time 1258    PT Stop Time 1336    PT Time Calculation (min) 38 min    Activity Tolerance Patient tolerated treatment well;Patient limited by pain    Behavior During Therapy WFL for tasks assessed/performed              Past Medical History:  Diagnosis Date   Asthma    Blood clot in vein    Chronic kidney disease    Diabetes mellitus without complication (HCC)    Hyperlipidemia    Hypertension    Kidney stone    Sleep apnea    Past Surgical History:  Procedure Laterality Date   ABDOMINAL HYSTERECTOMY     ABLATION     BREAST RECONSTRUCTION WITH MASTOPLASTY Bilateral 03/11/2020   Procedure: BREAST RECONSTRUCTION WITH MASTOPLASTY;  Surgeon: Eloise Levels, MD;  Location: [facility];  Service: Plastics;  Laterality: Bilateral;   CHOLECYSTECTOMY     COLONOSCOPY WITH PROPOFOL N/A 04/19/2022   Procedure: COLONOSCOPY WITH PROPOFOL;  Surgeon: Toney Reil, MD;  Location: [facility] ENDOSCOPY;  Service: Gastroenterology;  Laterality: N/A;   REDUCTION MAMMAPLASTY Bilateral    4 yrs ago   ROTATOR CUFF REPAIR Right    TUBAL LIGATION     Patient Active Problem List   Diagnosis Date Noted   OSA (obstructive sleep apnea) 06/09/2023   Aortic atherosclerosis (HCC) 03/15/2022   Hyperlipidemia associated with type 2 diabetes mellitus (HCC) 03/15/2022   Type 2 diabetes mellitus with hyperglycemia, without long-term current use of insulin (HCC) 03/15/2022   Osteoarthritis 03/15/2022   Class 3 severe obesity due to excess calories with body mass index (BMI) of 40.0 to 44.9 in adult [facility]) 03/15/2022   Essential hypertension 12/22/2017    PCP: Lorre Munroe,  NP   REFERRING PROVIDER: Huel Cote, MD  REFERRING DIAG: M25.551 (ICD-10-CM) - Pain in right hip  Rationale for Evaluation and Treatment: Rehabilitation  THERAPY DIAG:  Pain in right hip  Pain in left hip  Other low back pain  Chronic pain of right knee  Chronic pain of left knee  ONSET DATE: years ago  SUBJECTIVE:  SUBJECTIVE STATEMENT: Has muscle soreness. 4/10 R lateral hip pain currently.   PERTINENT HISTORY:  (Pt currently has limited voice) B hip pain. R hip pain worse than her L. Pain began gradually, years ago. Pain has gotten worse since onset.  Has had sharp burning pain in her L hip for the past 3 days, would happen, then it would quit. Had B steroid shot a few weeks ago which helped for about 2-3 weeks. However, pain returned with a vengeance. Cannot lay on her back due to sleep apnea (has a CPAP machine as well). 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   No latex allergies   PAIN:  Are you having pain? Yes: NPRS scale: See subjective /10 Pain location: B lateral hips R > L Pain description: burning, swelling that is about to burst, sharp, tender, sore, achy.  Aggravating factors: walking (first 2-3 steps), low back pain plays a factor with walking as well, going up steps, laying on R and L sides, standing up from sitting)  Relieving factors: sitting and leaning forward.   PRECAUTIONS: Other: no known precuations  RED FLAGS: Bowel or bladder incontinence: No and Cauda equina syndrome: No   WEIGHT BEARING RESTRICTIONS: No  FALLS:  Has patient fallen in last 6 months? No  LIVING ENVIRONMENT: Lives with: lives with their family and lives with  their daughter (Daughter and grandson lives with pt ) Lives in: House/apartment Stairs: Yes: External: 20 total  steps; on right going up Has following equipment at home: None  OCCUPATION: LPN  PLOF: Independent  PATIENT GOALS: decrease pain  NEXT MD VISIT: not known  OBJECTIVE:  Note: Objective measures were completed at Evaluation unless otherwise noted.  DIAGNOSTIC FINDINGS:  DG HIP UNILAT WITH PELVIS 2-3 VIEWS RIGHT 07/06/2023  CLINICAL DATA:  Bilateral hip pain.  No known injury.   EXAM: DG HIP (WITH OR WITHOUT PELVIS) 2-3V RIGHT; DG HIP (WITH OR WITHOUT PELVIS) 4+V LEFT   COMPARISON:  None Available.   FINDINGS: Right hip: Mild hip joint space narrowing with acetabular spurring. The femoral head is well seated. No fracture. No erosion, evidence of avascular necrosis or focal bone abnormality.   Left hip: Mild hip joint space narrowing. Slight lateral acetabular spurring. The femoral head is well seated. No fracture. No erosion, evidence of avascular necrosis or focal bone abnormality.   Bony pelvis: No focal bone abnormality or bone destruction. Pubic rami are intact. Pubic symphysis and sacroiliac joints are congruent. Tubal ligation clips in the pelvis.   Soft tissue attenuation from habitus limits detailed assessment.   IMPRESSION: Mild osteoarthritis of both hips.     Electronically Signed   By: Narda Rutherford M.D.   On: 07/17/2023 13:52    PATIENT SURVEYS:  FOTO Hip FOTO 31 (08/08/2023)  SCREENING FOR RED FLAGS: Bowel or bladder incontinence: No  Cauda equina syndrome: No  COGNITION: Overall cognitive status: Within functional limits for tasks assessed     SENSATION:   MUSCLE LENGTH:   POSTURE:  B foot pronation, R hip ER, R iliac crest and knee higher L lateral shift, movement preference around L1/L2 area Decreased low back and L1 dermatome pain with L lateral shift correction  Sit to stand: decreased B femoral control, pt  demonstrates B genu valgus and B foot pronation.  Wide stance decreases pressure in low back  and B hips    PALPATION: TTP R  > L greater trochanters TTP low back (increased sensitivity)    LUMBAR ROM:   AROM eval  Flexion WFL, decreased low back and B lateral hip pain. Low back pain during the return motion; walked up with hands; aberrant movement   Extension WFL, decreased back and no B lateral hip pain  Right lateral flexion WFL with R hip pain  Left lateral flexion WFL with L low back pain  Right rotation WFL with L low back pain  Left rotation WFL with low back pain   (Blank rows = not tested)  LOWER EXTREMITY ROM:     Active  Right eval Left eval  Hip flexion    Hip extension    Hip abduction    Hip adduction    Hip internal rotation    Hip external rotation    Knee flexion    Knee extension    Ankle dorsiflexion    Ankle plantarflexion    Ankle inversion    Ankle eversion     (Blank rows = not tested)  LOWER EXTREMITY MMT:    MMT Right eval Left eval  Hip flexion    Hip extension (seated manually resisted) 3  3  Hip abduction (seated manually resisted) 3+ (with reproduction of R lateral hip pain) 3+  Hip adduction    Hip internal rotation    Hip external rotation    Knee flexion 4- 4-  Knee extension 4 4+  Ankle dorsiflexion    Ankle plantarflexion    Ankle inversion    Ankle eversion     (Blank rows = not tested)     LUMBAR SPECIAL TESTS:    FUNCTIONAL TESTS:    GAIT: Distance walked:  Assistive device utilized: None Level of assistance: Complete Independence Comments:   TODAY'S TREATMENT:                                                                                                                              DATE: 08/10/2023     Therapeutic exercise   Reclined  Hooklying    Posterior pelvic tilt 10x5 seconds for 3 sets    Good abdominal muscle use observed.     clamshell isometric 40% effort 1 minute with 1 minute rest x  5    To promote appropriate loading to glute med tendons for healing.       Hip adduction isometrics, small blue ball squeeze 10x5 seconds for 3 sets     Hip extension isometrics, leg straight    R 5x5 seconds for 2 sets     L 5x5 seconds for 2 sets    Difficult for pt     Improved exercise technique, movement at target joints, use of target muscles after mod verbal, visual, tactile cues.     PATIENT EDUCATION:  Education details: there-ex, HEP, POC Person educated: Patient Education method: Explanation, Demonstration, Tactile cues, Verbal  cues, and Handouts Education comprehension: verbalized understanding and returned demonstration  HOME EXERCISE PROGRAM: Access Code: [email or web address] URL: [email or web address] Date: 08/08/2023 Prepared by: Loralyn Freshwater  Exercises - Seated Transversus Abdominis Bracing  - 5 x daily - 7 x weekly - 3 sets - 10 reps - 5 seconds hold - Seated Gluteal Sets  - 5 x daily - 7 x weekly - 3 sets - 10 reps - 5 seconds hold - Supine Posterior Pelvic Tilt  - 3 x daily - 7 x weekly - 3 sets - 10 reps - 5 seconds hold     ASSESSMENT:  CLINICAL IMPRESSION: Worked on trunk muscle and glute  strengthening to decrease extension stress to low back. Worked on isometric loading of tendons attached to her B greater trochanters to promote healing. Decreased R lateral hip pain to 1-2/10 during gait after session. Pt will benefit from continued skilled physical therapy services to decrease pain, improve strength and function.       OBJECTIVE IMPAIRMENTS: Abnormal gait, difficulty walking, decreased strength, improper body mechanics, postural dysfunction, and pain.   ACTIVITY LIMITATIONS: carrying, lifting, bending, standing, squatting, sleeping, stairs, transfers, and locomotion level  PARTICIPATION LIMITATIONS:   PERSONAL FACTORS: Age, Fitness, Past/current experiences, Profession, Time since onset of injury/illness/exacerbation, and 3+  comorbidities: asthma, chronic kidney disease, DM, HTN, sleep apnea, hysterectomy  are also affecting patient's functional outcome.   REHAB POTENTIAL: Fair    CLINICAL DECISION MAKING: Evolving/moderate complexity Pt states pain has worsened since onset  EVALUATION COMPLEXITY: Moderate   GOALS: Goals reviewed with patient? Yes  SHORT TERM GOALS: Target date: 08/26/2023   Pt will be independent with her initial HEP to decrease pain, improve strength, function, and ability to ambulate, negotiate stairs as well as sleep on her side more comfortably.  Baseline: Pt has started her initial HEP (08/08/2023) Goal status: INITIAL     LONG TERM GOALS: Target date: 10/07/2023  Pt will have a decrease in R and L lateral hip pain to 5/10 or less at worst to promote ability to ambulate, negotiate stairs, as well as lay on her side more comfortably.  Baseline: 10+/10 R and L lateral hip pain at worst for the past 3 months (08/08/2023) Goal status: INITIAL  2.  Pt will improve her B hip extension and abduction strength by at least 1/2 MMT  to promote ability to ambulate and negotiate stairs, more comfortably.  Baseline:  MMT Right eval Left eval  Hip extension (seated manually resisted) 3  3  Hip abduction (seated manually resisted) 3+ (with reproduction of R lateral hip pain) 3+   Goal status: INITIAL  3.  Pt will improve her hip FOTO score by at least 10 points as a demonstration of improved function.  Baseline: Hip FOTO 31 (08/08/2023) Goal status: INITIAL  PLAN:  PT FREQUENCY: 2x/week  PT DURATION: 8 weeks  PLANNED INTERVENTIONS: Therapeutic exercises, Therapeutic activity, Neuromuscular re-education, Gait training, Patient/Family education, Joint mobilization, Joint manipulation, Stair training, Aquatic Therapy, Dry Needling, Electrical stimulation, Spinal mobilization, Manual therapy, and Re-evaluation.  PLAN FOR NEXT SESSION: Posture, trunk and hip strengthening, lumbopelvic  control, manual techniques, modalities PRN   Braniyah Besse, PT, DPT 08/10/2023, 1:52 PM

## 2023-08-16 ENCOUNTER — Ambulatory Visit: Payer: BC Managed Care – PPO

## 2023-08-16 DIAGNOSIS — M25561 Pain in right knee: Secondary | ICD-10-CM | POA: Diagnosis not present

## 2023-08-16 DIAGNOSIS — G8929 Other chronic pain: Secondary | ICD-10-CM | POA: Diagnosis not present

## 2023-08-16 DIAGNOSIS — M5459 Other low back pain: Secondary | ICD-10-CM

## 2023-08-16 DIAGNOSIS — M25551 Pain in right hip: Secondary | ICD-10-CM

## 2023-08-16 DIAGNOSIS — M25552 Pain in left hip: Secondary | ICD-10-CM | POA: Diagnosis not present

## 2023-08-16 DIAGNOSIS — R2689 Other abnormalities of gait and mobility: Secondary | ICD-10-CM

## 2023-08-16 DIAGNOSIS — M25562 Pain in left knee: Secondary | ICD-10-CM | POA: Diagnosis not present

## 2023-08-16 NOTE — Therapy (Signed)
OUTPATIENT PHYSICAL THERAPY  Treatment    Patient Name: Shannon Bryan MRN: [id number] DOB:05/23/1961, 62 y.o., female Today's Date: 08/16/2023  END OF SESSION:  PT End of Session - 08/16/23 1317     Visit Number 3    Number of Visits 17    Date for PT Re-Evaluation 10/07/23    PT Start Time 1317    PT Stop Time 1359    PT Time Calculation (min) 42 min    Activity Tolerance Patient tolerated treatment well    Behavior During Therapy WFL for tasks assessed/performed               Past Medical History:  Diagnosis Date   Asthma    Blood clot in vein    Chronic kidney disease    Diabetes mellitus without complication (HCC)    Hyperlipidemia    Hypertension    Kidney stone    Sleep apnea    Past Surgical History:  Procedure Laterality Date   ABDOMINAL HYSTERECTOMY     ABLATION     BREAST RECONSTRUCTION WITH MASTOPLASTY Bilateral 03/11/2020   Procedure: BREAST RECONSTRUCTION WITH MASTOPLASTY;  Surgeon: Eloise Levels, MD;  Location: [facility];  Service: Plastics;  Laterality: Bilateral;   CHOLECYSTECTOMY     COLONOSCOPY WITH PROPOFOL N/A 04/19/2022   Procedure: COLONOSCOPY WITH PROPOFOL;  Surgeon: Toney Reil, MD;  Location: [facility] ENDOSCOPY;  Service: Gastroenterology;  Laterality: N/A;   REDUCTION MAMMAPLASTY Bilateral    4 yrs ago   ROTATOR CUFF REPAIR Right    TUBAL LIGATION     Patient Active Problem List   Diagnosis Date Noted   OSA (obstructive sleep apnea) 06/09/2023   Aortic atherosclerosis (HCC) 03/15/2022   Hyperlipidemia associated with type 2 diabetes mellitus (HCC) 03/15/2022   Type 2 diabetes mellitus with hyperglycemia, without long-term current use of insulin (HCC) 03/15/2022   Osteoarthritis 03/15/2022   Class 3 severe obesity due to excess calories with body mass index (BMI) of 40.0 to 44.9 in adult [facility]) 03/15/2022   Essential hypertension 12/22/2017    PCP: Lorre Munroe, NP   REFERRING PROVIDER:  Huel Cote, MD  REFERRING DIAG: [phone] (ICD-10-CM) - Pain in right hip  Rationale for Evaluation and Treatment: Rehabilitation  THERAPY DIAG:  Pain in right hip  Pain in left hip  Other low back pain  Chronic pain of right knee  Chronic pain of left knee  Other abnormalities of gait and mobility  ONSET DATE: years ago  SUBJECTIVE:  SUBJECTIVE STATEMENT: Had muscle soreness in abdomen but better. Back hurts all the time. No R lateral hip pain currently. PT is helping everything but the lower back.     PERTINENT HISTORY:  (Pt currently has limited voice) B hip pain. R hip pain worse than her L. Pain began gradually, years ago. Pain has gotten worse since onset.  Has had sharp burning pain in her L hip for the past 3 days, would happen, then it would quit. Had B steroid shot a few weeks ago which helped for about 2-3 weeks. However, pain returned with a vengeance. Cannot lay on her back due to sleep apnea (has a CPAP machine as well). 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   No latex allergies   PAIN:  Are you having pain? Yes: NPRS scale: See subjective /10 Pain location: B lateral hips R > L Pain description: burning, swelling that is about to burst, sharp, tender, sore, achy.  Aggravating factors: walking (first 2-3 steps), low back pain plays a factor with walking as well, going up steps, laying on R and L sides, standing up from sitting)  Relieving factors: sitting and leaning forward.   PRECAUTIONS: Other: no known precuations  RED FLAGS: Bowel or bladder incontinence: No and Cauda equina syndrome: No   WEIGHT BEARING RESTRICTIONS: No  FALLS:  Has  patient fallen in last 6 months? No  LIVING ENVIRONMENT: Lives with: lives with their family and lives with their daughter (Daughter and grandson lives with pt ) Lives in: House/apartment Stairs: Yes: External: 20 total  steps; on right going up Has following equipment at home: None  OCCUPATION: LPN  PLOF: Independent  PATIENT GOALS: decrease pain  NEXT MD VISIT: not known  OBJECTIVE:  Note: Objective measures were completed at Evaluation unless otherwise noted.  DIAGNOSTIC FINDINGS:  DG HIP UNILAT WITH PELVIS 2-3 VIEWS RIGHT 07/06/2023  CLINICAL DATA:  Bilateral hip pain.  No known injury.   EXAM: DG HIP (WITH OR WITHOUT PELVIS) 2-3V RIGHT; DG HIP (WITH OR WITHOUT PELVIS) 4+V LEFT   COMPARISON:  None Available.   FINDINGS: Right hip: Mild hip joint space narrowing with acetabular spurring. The femoral head is well seated. No fracture. No erosion, evidence of avascular necrosis or focal bone abnormality.   Left hip: Mild hip joint space narrowing. Slight lateral acetabular spurring. The femoral head is well seated. No fracture. No erosion, evidence of avascular necrosis or focal bone abnormality.   Bony pelvis: No focal bone abnormality or bone destruction. Pubic rami are intact. Pubic symphysis and sacroiliac joints are congruent. Tubal ligation clips in the pelvis.   Soft tissue attenuation from habitus limits detailed assessment.   IMPRESSION: Mild osteoarthritis of both hips.     Electronically Signed   By: Narda Rutherford M.D.   On: 07/17/2023 13:52    PATIENT SURVEYS:  FOTO Hip FOTO 31 (08/08/2023)  SCREENING FOR RED FLAGS: Bowel or bladder incontinence: No  Cauda equina syndrome: No  COGNITION: Overall cognitive status: Within functional limits for tasks assessed     SENSATION:   MUSCLE LENGTH:   POSTURE:  B foot pronation, R hip ER, R iliac crest and knee higher L lateral shift, movement preference around L1/L2 area Decreased low back and  L1 dermatome pain with L lateral shift correction  Sit to stand: decreased B  femoral control, pt demonstrates B genu valgus and B foot pronation.  Wide stance decreases pressure in low back and B hips    PALPATION: TTP R  > L greater trochanters TTP low back (increased sensitivity)    LUMBAR ROM:   AROM eval  Flexion WFL, decreased low back and B lateral hip pain. Low back pain during the return motion; walked up with hands; aberrant movement   Extension WFL, decreased back and no B lateral hip pain  Right lateral flexion WFL with R hip pain  Left lateral flexion WFL with L low back pain  Right rotation WFL with L low back pain  Left rotation WFL with low back pain   (Blank rows = not tested)  LOWER EXTREMITY ROM:     Active  Right eval Left eval  Hip flexion    Hip extension    Hip abduction    Hip adduction    Hip internal rotation    Hip external rotation    Knee flexion    Knee extension    Ankle dorsiflexion    Ankle plantarflexion    Ankle inversion    Ankle eversion     (Blank rows = not tested)  LOWER EXTREMITY MMT:    MMT Right eval Left eval  Hip flexion    Hip extension (seated manually resisted) 3  3  Hip abduction (seated manually resisted) 3+ (with reproduction of R lateral hip pain) 3+  Hip adduction    Hip internal rotation    Hip external rotation    Knee flexion 4- 4-  Knee extension 4 4+  Ankle dorsiflexion    Ankle plantarflexion    Ankle inversion    Ankle eversion     (Blank rows = not tested)     LUMBAR SPECIAL TESTS:    FUNCTIONAL TESTS:    GAIT: Distance walked:  Assistive device utilized: None Level of assistance: Complete Independence Comments:   TODAY'S TREATMENT:                                                                                                                              DATE: 08/16/2023     Therapeutic exercise   Reclined  Hooklying    Posterior pelvic tilt 10x5 seconds for 2  sets    Good abdominal muscle use observed    clamshell isometric 40% effort 1 minute with 1 minute rest x 5    To promote appropriate loading to glute med tendons for healing.    .      Hip adduction isometrics, small blue ball squeeze 10x5 seconds for 2 sets     Hip extension isometrics, leg straight    R 10x5 seconds for 2 sets     L 10x5 seconds for 2 sets       Hip abduction with straight leg on pillow    R 10x    L 10x  Improved exercise technique, movement at target joints, use of target muscles after mod verbal, visual, tactile cues.     PATIENT EDUCATION:  Education details: there-ex, HEP, POC Person educated: Patient Education method: Explanation, Demonstration, Tactile cues, Verbal cues, and Handouts Education comprehension: verbalized understanding and returned demonstration  HOME EXERCISE PROGRAM: Access Code: [email or web address] URL: [email or web address] Date: 08/08/2023 Prepared by: Loralyn Freshwater  Exercises - Seated Transversus Abdominis Bracing  - 5 x daily - 7 x weekly - 3 sets - 10 reps - 5 seconds hold - Seated Gluteal Sets  - 5 x daily - 7 x weekly - 3 sets - 10 reps - 5 seconds hold - Supine Posterior Pelvic Tilt  - 3 x daily - 7 x weekly - 3 sets - 10 reps - 5 seconds hold     ASSESSMENT:  CLINICAL IMPRESSION: Improving hip comfort level based on subjective reports. Continued working on trunk and glute muscle strengthening to decrease stress to low back as well as isometric and concentric loading of tendons attached to her B greater trochanters to promote healing. Pt will benefit from continued skilled physical therapy services to decrease pain, improve strength and function.       OBJECTIVE IMPAIRMENTS: Abnormal gait, difficulty walking, decreased strength, improper body mechanics, postural dysfunction, and pain.   ACTIVITY LIMITATIONS: carrying, lifting, bending, standing, squatting, sleeping, stairs, transfers, and locomotion  level  PARTICIPATION LIMITATIONS:   PERSONAL FACTORS: Age, Fitness, Past/current experiences, Profession, Time since onset of injury/illness/exacerbation, and 3+ comorbidities: asthma, chronic kidney disease, DM, HTN, sleep apnea, hysterectomy  are also affecting patient's functional outcome.   REHAB POTENTIAL: Fair    CLINICAL DECISION MAKING: Evolving/moderate complexity Pt states pain has worsened since onset  EVALUATION COMPLEXITY: Moderate   GOALS: Goals reviewed with patient? Yes  SHORT TERM GOALS: Target date: 08/26/2023   Pt will be independent with her initial HEP to decrease pain, improve strength, function, and ability to ambulate, negotiate stairs as well as sleep on her side more comfortably.  Baseline: Pt has started her initial HEP (08/08/2023) Goal status: INITIAL     LONG TERM GOALS: Target date: 10/07/2023  Pt will have a decrease in R and L lateral hip pain to 5/10 or less at worst to promote ability to ambulate, negotiate stairs, as well as lay on her side more comfortably.  Baseline: 10+/10 R and L lateral hip pain at worst for the past 3 months (08/08/2023) Goal status: INITIAL  2.  Pt will improve her B hip extension and abduction strength by at least 1/2 MMT  to promote ability to ambulate and negotiate stairs, more comfortably.  Baseline:  MMT Right eval Left eval  Hip extension (seated manually resisted) 3  3  Hip abduction (seated manually resisted) 3+ (with reproduction of R lateral hip pain) 3+   Goal status: INITIAL  3.  Pt will improve her hip FOTO score by at least 10 points as a demonstration of improved function.  Baseline: Hip FOTO 31 (08/08/2023) Goal status: INITIAL  PLAN:  PT FREQUENCY: 2x/week  PT DURATION: 8 weeks  PLANNED INTERVENTIONS: Therapeutic exercises, Therapeutic activity, Neuromuscular re-education, Gait training, Patient/Family education, Joint mobilization, Joint manipulation, Stair training, Aquatic Therapy, Dry  Needling, Electrical stimulation, Spinal mobilization, Manual therapy, and Re-evaluation.  PLAN FOR NEXT SESSION: Posture, trunk and hip strengthening, lumbopelvic control, manual techniques, modalities PRN   Deno Sida, PT, DPT 08/16/2023, 2:18 PM

## 2023-08-17 ENCOUNTER — Ambulatory Visit: Payer: BC Managed Care – PPO

## 2023-08-19 ENCOUNTER — Ambulatory Visit: Payer: BC Managed Care – PPO

## 2023-08-19 DIAGNOSIS — G8929 Other chronic pain: Secondary | ICD-10-CM

## 2023-08-19 DIAGNOSIS — M25552 Pain in left hip: Secondary | ICD-10-CM

## 2023-08-19 DIAGNOSIS — M25551 Pain in right hip: Secondary | ICD-10-CM

## 2023-08-19 DIAGNOSIS — R2689 Other abnormalities of gait and mobility: Secondary | ICD-10-CM | POA: Diagnosis not present

## 2023-08-19 DIAGNOSIS — M25562 Pain in left knee: Secondary | ICD-10-CM | POA: Diagnosis not present

## 2023-08-19 DIAGNOSIS — M5459 Other low back pain: Secondary | ICD-10-CM | POA: Diagnosis not present

## 2023-08-19 DIAGNOSIS — M25561 Pain in right knee: Secondary | ICD-10-CM | POA: Diagnosis not present

## 2023-08-19 NOTE — Therapy (Signed)
OUTPATIENT PHYSICAL THERAPY  Treatment    Patient Name: Shannon Bryan MRN: [id number] DOB:Sep 16, 1961, 62 y.o., female Today's Date: 08/19/2023  END OF SESSION:  PT End of Session - 08/19/23 0734     Visit Number 4    Number of Visits 17    Date for PT Re-Evaluation 10/07/23    PT Start Time 0734    PT Stop Time 0815    PT Time Calculation (min) 41 min    Activity Tolerance Patient tolerated treatment well    Behavior During Therapy [facility] for tasks assessed/performed                Past Medical History:  Diagnosis Date   Asthma    Blood clot in vein    Chronic kidney disease    Diabetes mellitus without complication (HCC)    Hyperlipidemia    Hypertension    Kidney stone    Sleep apnea    Past Surgical History:  Procedure Laterality Date   ABDOMINAL HYSTERECTOMY     ABLATION     BREAST RECONSTRUCTION WITH MASTOPLASTY Bilateral 03/11/2020   Procedure: BREAST RECONSTRUCTION WITH MASTOPLASTY;  Surgeon: Eloise Levels, MD;  Location: [facility];  Service: Plastics;  Laterality: Bilateral;   CHOLECYSTECTOMY     COLONOSCOPY WITH PROPOFOL N/A 04/19/2022   Procedure: COLONOSCOPY WITH PROPOFOL;  Surgeon: Toney Reil, MD;  Location: [facility] END[facility]COPY;  Service: Gastroenterology;  Laterality: N/A;   REDUCTION MAMMAPLASTY Bilateral    4 yrs ago   ROTATOR CUFF REPAIR Right    TUBAL LIGATION     Patient Active Problem List   Diagnosis Date Noted   OSA (obstructive sleep apnea) 06/09/2023   Aortic atherosclerosis (HCC) 03/15/2022   Hyperlipidemia associated with type 2 diabetes mellitus (HCC) 03/15/2022   Type 2 diabetes mellitus with hyperglycemia, without long-term current use of insulin (HCC) 03/15/2022   Osteoarthritis 03/15/2022   Class 3 severe obesity due to excess calories with body mass index (BMI) of 40.0 to 44.9 in adult [facility]) 03/15/2022   Essential hypertension 12/22/2017    PCP: Lorre Munroe, NP   REFERRING PROVIDER:  Huel Cote, MD  REFERRING DIAG: M25.551 (ICD-10-CM) - Pain in right hip  Rationale for Evaluation and Treatment: Rehabilitation  THERAPY DIAG:  Pain in right hip  Pain in left hip  Other low back pain  Chronic pain of right knee  Chronic pain of left knee  ONSET DATE: years ago  SUBJECTIVE:  SUBJECTIVE STATEMENT: Its cold, the low back hurt all night long. Took tylenol. 2-3/10 low back and R hip pain currently. L lateral knee cap is hurting as well because its cold.     PERTINENT HISTORY:  (Pt currently has limited voice) B hip pain. R hip pain worse than her L. Pain began gradually, years ago. Pain has gotten worse since onset.  Has had sharp burning pain in her L hip for the past 3 days, would happen, then it would quit. Had B steroid shot a few weeks ago which helped for about 2-3 weeks. However, pain returned with a vengeance. Cannot lay on her back due to sleep apnea (has a CPAP machine as well). 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   No latex allergies   Back surgery was a cyst removal and not a lumbar fusion per pt.   PAIN:  Are you having pain? Yes: NPRS scale: See subjective /10 Pain location: B lateral hips R > L Pain description: burning, swelling that is about to burst, sharp, tender, sore, achy.  Aggravating factors: walking (first 2-3 steps), low back pain plays a factor with walking as well, going up steps, laying on R and L sides, standing up from sitting)  Relieving factors: sitting and leaning forward.  Standing and leaning back with feet apart.   PRECAUTIONS: Other: no known precuations  RED FLAGS: Bowel or bladder incontinence: No and  Cauda equina syndrome: No   WEIGHT BEARING RESTRICTIONS: No  FALLS:  Has patient fallen in last 6 months? No  LIVING ENVIRONMENT: Lives with: lives with their family and lives with their daughter (Daughter and grandson lives with pt ) Lives in: House/apartment Stairs: Yes: External: 20 total  steps; on right going up Has following equipment at home: None  OCCUPATION: LPN  PLOF: Independent  PATIENT GOALS: decrease pain  NEXT MD VISIT: not known  OBJECTIVE:  Note: Objective measures were completed at Evaluation unless otherwise noted.  DIAGNOSTIC FINDINGS:  DG HIP UNILAT WITH PELVIS 2-3 VIEWS RIGHT 07/06/2023  CLINICAL DATA:  Bilateral hip pain.  No known injury.   EXAM: DG HIP (WITH OR WITHOUT PELVIS) 2-3V RIGHT; DG HIP (WITH OR WITHOUT PELVIS) 4+V LEFT   COMPARISON:  None Available.   FINDINGS: Right hip: Mild hip joint space narrowing with acetabular spurring. The femoral head is well seated. No fracture. No erosion, evidence of avascular necrosis or focal bone abnormality.   Left hip: Mild hip joint space narrowing. Slight lateral acetabular spurring. The femoral head is well seated. No fracture. No erosion, evidence of avascular necrosis or focal bone abnormality.   Bony pelvis: No focal bone abnormality or bone destruction. Pubic rami are intact. Pubic symphysis and sacroiliac joints are congruent. Tubal ligation clips in the pelvis.   Soft tissue attenuation from habitus limits detailed assessment.   IMPRESSION: Mild osteoarthritis of both hips.     Electronically Signed   By: Narda Rutherford M.D.   On: 07/17/2023 13:52    PATIENT SURVEYS:  FOTO Hip FOTO 31 (08/08/2023)  SCREENING FOR RED FLAGS: Bowel or bladder incontinence: No  Cauda equina syndrome: No  COGNITION: Overall cognitive status: Within functional limits for tasks assessed     SENSATION:   MUSCLE LENGTH:   POSTURE:  B foot pronation, R hip ER, R iliac crest and knee  higher L lateral shift, movement preference around L1/L2 area Decreased low back and L1 dermatome pain with L lateral shift correction  Sit to stand: decreased B femoral control, pt demonstrates B genu valgus and B foot pronation.  Wide stance decreases pressure in low back and B hips    PALPATION: TTP R  > L greater trochanters TTP low back (increased sensitivity)    LUMBAR ROM:   AROM eval  Flexion WFL, decreased low back and B lateral hip pain. Low back pain during the return motion; walked up with hands; aberrant movement   Extension WFL, decreased back and no B lateral hip pain  Right lateral flexion WFL with R hip pain  Left lateral flexion WFL with L low back pain  Right rotation WFL with L low back pain  Left rotation WFL with low back pain   (Blank rows = not tested)  LOWER EXTREMITY ROM:     Active  Right eval Left eval  Hip flexion    Hip extension    Hip abduction    Hip adduction    Hip internal rotation    Hip external rotation    Knee flexion    Knee extension    Ankle dorsiflexion    Ankle plantarflexion    Ankle inversion    Ankle eversion     (Blank rows = not tested)  LOWER EXTREMITY MMT:    MMT Right eval Left eval  Hip flexion    Hip extension (seated manually resisted) 3  3  Hip abduction (seated manually resisted) 3+ (with reproduction of R lateral hip pain) 3+  Hip adduction    Hip internal rotation    Hip external rotation    Knee flexion 4- 4-  Knee extension 4 4+  Ankle dorsiflexion    Ankle plantarflexion    Ankle inversion    Ankle eversion     (Blank rows = not tested)     LUMBAR SPECIAL TESTS:    FUNCTIONAL TESTS:    GAIT: Distance walked:  Assistive device utilized: None Level of assistance: Complete Independence Comments:   TODAY'S TREATMENT:                                                                                                                              DATE: 08/19/2023     Therapeutic  exercise   Reclined  Hooklying    Transversus abdominis contraction 10x10 seconds     Posterior pelvic tilt 10x5 seconds for 2 sets    Good abdominal muscle use observed    clamshell isometric 40% effort 1 minute with 1 minute rest x 5    To promote appropriate loading to glute med tendons for healing.     Posterior pelvic tilt with march 5x each LE    Difficult per pt.      Limited L hip IR PROM   Reproduction of R posterior hip pain with L hip ER PROM  in 90/90 position    Seated hip adduction folded pillow squeeze 10x5 seconds       Improved exercise technique, movement at target joints, use of target muscles after mod verbal, visual, tactile cues.     PATIENT EDUCATION:  Education details: there-ex, HEP, POC Person educated: Patient Education method: Explanation, Demonstration, Tactile cues, Verbal cues, and Handouts Education comprehension: verbalized understanding and returned demonstration  HOME EXERCISE PROGRAM: Access Code: [email or web address] URL: [email or web address] Date: 08/08/2023 Prepared by: Loralyn Freshwater  Exercises - Seated Transversus Abdominis Bracing  - 5 x daily - 7 x weekly - 3 sets - 10 reps - 5 seconds hold - Seated Gluteal Sets  - 5 x daily - 7 x weekly - 3 sets - 10 reps - 5 seconds hold - Supine Posterior Pelvic Tilt  - 3 x daily - 7 x weekly - 3 sets - 10 reps - 5 seconds hold     ASSESSMENT:  CLINICAL IMPRESSION:  Continued working on trunk and glute muscle strengthening to decrease stress to low back as well as isometric and concentric loading of tendons attached to her B greater trochanters to promote healing. Back and hip are better after session reports.Pt will benefit from continued skilled physical therapy services to decrease pain, improve strength and function.       OBJECTIVE IMPAIRMENTS: Abnormal gait, difficulty walking, decreased strength, improper body mechanics, postural dysfunction, and pain.   ACTIVITY  LIMITATIONS: carrying, lifting, bending, standing, squatting, sleeping, stairs, transfers, and locomotion level  PARTICIPATION LIMITATIONS:   PERSONAL FACTORS: Age, Fitness, Past/current experiences, Profession, Time since onset of injury/illness/exacerbation, and 3+ comorbidities: asthma, chronic kidney disease, DM, HTN, sleep apnea, hysterectomy  are also affecting patient's functional outcome.   REHAB POTENTIAL: Fair    CLINICAL DECISION MAKING: Evolving/moderate complexity Pt states pain has worsened since onset  EVALUATION COMPLEXITY: Moderate   GOALS: Goals reviewed with patient? Yes  SHORT TERM GOALS: Target date: 08/26/2023   Pt will be independent with her initial HEP to decrease pain, improve strength, function, and ability to ambulate, negotiate stairs as well as sleep on her side more comfortably.  Baseline: Pt has started her initial HEP (08/08/2023) Goal status: INITIAL     LONG TERM GOALS: Target date: 10/07/2023  Pt will have a decrease in R and L lateral hip pain to 5/10 or less at worst to promote ability to ambulate, negotiate stairs, as well as lay on her side more comfortably.  Baseline: 10+/10 R and L lateral hip pain at worst for the past 3 months (08/08/2023) Goal status: INITIAL  2.  Pt will improve her B hip extension and abduction strength by at least 1/2 MMT  to promote ability to ambulate and negotiate stairs, more comfortably.  Baseline:  MMT Right eval Left eval  Hip extension (seated manually resisted) 3  3  Hip abduction (seated manually resisted) 3+ (with reproduction of R lateral hip pain) 3+   Goal status: INITIAL  3.  Pt will improve her hip FOTO score by at least 10 points as a demonstration of improved function.  Baseline: Hip FOTO 31 (08/08/2023) Goal status: INITIAL  PLAN:  PT FREQUENCY: 2x/week  PT DURATION: 8 weeks  PLANNED INTERVENTIONS: Therapeutic exercises, Therapeutic activity, Neuromuscular re-education, Gait training,  Patient/Family education, Joint mobilization, Joint manipulation, Stair training, Aquatic Therapy, Dry Needling, Electrical stimulation, Spinal mobilization, Manual therapy, and Re-evaluation.  PLAN FOR NEXT SESSION: Posture, trunk and hip strengthening, lumbopelvic control, manual techniques, modalities PRN   Celina Shiley, PT,  DPT 08/19/2023, 9:15 AM

## 2023-08-22 ENCOUNTER — Ambulatory Visit: Payer: BC Managed Care – PPO

## 2023-08-22 DIAGNOSIS — M5459 Other low back pain: Secondary | ICD-10-CM | POA: Diagnosis not present

## 2023-08-22 DIAGNOSIS — M25561 Pain in right knee: Secondary | ICD-10-CM | POA: Diagnosis not present

## 2023-08-22 DIAGNOSIS — G8929 Other chronic pain: Secondary | ICD-10-CM

## 2023-08-22 DIAGNOSIS — M25552 Pain in left hip: Secondary | ICD-10-CM | POA: Diagnosis not present

## 2023-08-22 DIAGNOSIS — M25551 Pain in right hip: Secondary | ICD-10-CM

## 2023-08-22 DIAGNOSIS — R2689 Other abnormalities of gait and mobility: Secondary | ICD-10-CM | POA: Diagnosis not present

## 2023-08-22 DIAGNOSIS — M25562 Pain in left knee: Secondary | ICD-10-CM | POA: Diagnosis not present

## 2023-08-22 NOTE — Therapy (Signed)
OUTPATIENT PHYSICAL THERAPY  Treatment    Patient Name: Shannon Bryan MRN: [id number] DOB:08-Oct-1961, 62 y.o., female Today's Date: 08/22/2023  END OF SESSION:  PT End of Session - 08/22/23 1309     Visit Number 5    Number of Visits 17    Date for PT Re-Evaluation 10/07/23    PT Start Time 1309   pt arrived late   PT Stop Time 1343    PT Time Calculation (min) 34 min    Activity Tolerance Patient tolerated treatment well    Behavior During Therapy WFL for tasks assessed/performed                 Past Medical History:  Diagnosis Date   Asthma    Blood clot in vein    Chronic kidney disease    Diabetes mellitus without complication (HCC)    Hyperlipidemia    Hypertension    Kidney stone    Sleep apnea    Past Surgical History:  Procedure Laterality Date   ABDOMINAL HYSTERECTOMY     ABLATION     BREAST RECONSTRUCTION WITH MASTOPLASTY Bilateral 03/11/2020   Procedure: BREAST RECONSTRUCTION WITH MASTOPLASTY;  Surgeon: Eloise Levels, MD;  Location: [facility];  Service: Plastics;  Laterality: Bilateral;   CHOLECYSTECTOMY     COLONOSCOPY WITH PROPOFOL N/A 04/19/2022   Procedure: COLONOSCOPY WITH PROPOFOL;  Surgeon: Toney Reil, MD;  Location: [facility] ENDOSCOPY;  Service: Gastroenterology;  Laterality: N/A;   REDUCTION MAMMAPLASTY Bilateral    4 yrs ago   ROTATOR CUFF REPAIR Right    TUBAL LIGATION     Patient Active Problem List   Diagnosis Date Noted   OSA (obstructive sleep apnea) 06/09/2023   Aortic atherosclerosis (HCC) 03/15/2022   Hyperlipidemia associated with type 2 diabetes mellitus (HCC) 03/15/2022   Type 2 diabetes mellitus with hyperglycemia, without long-term current use of insulin (HCC) 03/15/2022   Osteoarthritis 03/15/2022   Class 3 severe obesity due to excess calories with body mass index (BMI) of 40.0 to 44.9 in adult [facility]) 03/15/2022   Essential hypertension 12/22/2017    PCP: Lorre Munroe,  NP   REFERRING PROVIDER: Huel Cote, MD  REFERRING DIAG: M25.551 (ICD-10-CM) - Pain in right hip  Rationale for Evaluation and Treatment: Rehabilitation  THERAPY DIAG:  Pain in right hip  Pain in left hip  Other low back pain  Chronic pain of right knee  Chronic pain of left knee  ONSET DATE: years ago  SUBJECTIVE:  SUBJECTIVE STATEMENT: R hip (R low back) hurt all night. 4/10 R low back pain currently. No R lateral or L lateral hip pain currently.    PERTINENT HISTORY:  (Pt currently has limited voice) B hip pain. R hip pain worse than her L. Pain began gradually, years ago. Pain has gotten worse since onset.  Has had sharp burning pain in her L hip for the past 3 days, would happen, then it would quit. Had B steroid shot a few weeks ago which helped for about 2-3 weeks. However, pain returned with a vengeance. Cannot lay on her back due to sleep apnea (has a CPAP machine as well). 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   No latex allergies   Back surgery was a cyst removal and not a lumbar fusion per pt.   PAIN:  Are you having pain? Yes: NPRS scale: See subjective /10 Pain location: B lateral hips R > L Pain description: burning, swelling that is about to burst, sharp, tender, sore, achy.  Aggravating factors: walking (first 2-3 steps), low back pain plays a factor with walking as well, going up steps, laying on R and L sides, standing up from sitting)  Relieving factors: sitting and leaning forward.  Standing and leaning back with feet apart.   PRECAUTIONS: Other: no known precuations  RED FLAGS: Bowel or bladder incontinence: No and Cauda equina  syndrome: No   WEIGHT BEARING RESTRICTIONS: No  FALLS:  Has patient fallen in last 6 months? No  LIVING ENVIRONMENT: Lives with: lives with their family and lives with their daughter (Daughter and grandson lives with pt ) Lives in: House/apartment Stairs: Yes: External: 20 total  steps; on right going up Has following equipment at home: None  OCCUPATION: LPN  PLOF: Independent  PATIENT GOALS: decrease pain  NEXT MD VISIT: not known  OBJECTIVE:  Note: Objective measures were completed at Evaluation unless otherwise noted.  DIAGNOSTIC FINDINGS:  DG HIP UNILAT WITH PELVIS 2-3 VIEWS RIGHT 07/06/2023  CLINICAL DATA:  Bilateral hip pain.  No known injury.   EXAM: DG HIP (WITH OR WITHOUT PELVIS) 2-3V RIGHT; DG HIP (WITH OR WITHOUT PELVIS) 4+V LEFT   COMPARISON:  None Available.   FINDINGS: Right hip: Mild hip joint space narrowing with acetabular spurring. The femoral head is well seated. No fracture. No erosion, evidence of avascular necrosis or focal bone abnormality.   Left hip: Mild hip joint space narrowing. Slight lateral acetabular spurring. The femoral head is well seated. No fracture. No erosion, evidence of avascular necrosis or focal bone abnormality.   Bony pelvis: No focal bone abnormality or bone destruction. Pubic rami are intact. Pubic symphysis and sacroiliac joints are congruent. Tubal ligation clips in the pelvis.   Soft tissue attenuation from habitus limits detailed assessment.   IMPRESSION: Mild osteoarthritis of both hips.     Electronically Signed   By: Narda Rutherford M.D.   On: 07/17/2023 13:52    PATIENT SURVEYS:  FOTO Hip FOTO 31 (08/08/2023)  SCREENING FOR RED FLAGS: Bowel or bladder incontinence: No  Cauda equina syndrome: No  COGNITION: Overall cognitive status: Within functional limits for tasks assessed     SENSATION:   MUSCLE LENGTH:   POSTURE:  B foot pronation, R hip ER, R iliac crest and knee higher L lateral  shift, movement preference around L1/L2  area Decreased low back and L1 dermatome pain with L lateral shift correction  Sit to stand: decreased B femoral control, pt demonstrates B genu valgus and B foot pronation.  Wide stance decreases pressure in low back and B hips    PALPATION: TTP R  > L greater trochanters TTP low back (increased sensitivity)    LUMBAR ROM:   AROM eval  Flexion WFL, decreased low back and B lateral hip pain. Low back pain during the return motion; walked up with hands; aberrant movement   Extension WFL, decreased back and no B lateral hip pain  Right lateral flexion WFL with R hip pain  Left lateral flexion WFL with L low back pain  Right rotation WFL with L low back pain  Left rotation WFL with low back pain   (Blank rows = not tested)  LOWER EXTREMITY ROM:     Active  Right eval Left eval  Hip flexion    Hip extension    Hip abduction    Hip adduction    Hip internal rotation    Hip external rotation    Knee flexion    Knee extension    Ankle dorsiflexion    Ankle plantarflexion    Ankle inversion    Ankle eversion     (Blank rows = not tested)  LOWER EXTREMITY MMT:    MMT Right eval Left eval  Hip flexion    Hip extension (seated manually resisted) 3  3  Hip abduction (seated manually resisted) 3+ (with reproduction of R lateral hip pain) 3+  Hip adduction    Hip internal rotation    Hip external rotation    Knee flexion 4- 4-  Knee extension 4 4+  Ankle dorsiflexion    Ankle plantarflexion    Ankle inversion    Ankle eversion     (Blank rows = not tested)     LUMBAR SPECIAL TESTS:    FUNCTIONAL TESTS:    GAIT: Distance walked:  Assistive device utilized: None Level of assistance: Complete Independence Comments:   TODAY'S TREATMENT:                                                                                                                              DATE: 08/22/2023     Therapeutic exercise  Seated  trunk flexion 10 seconds. Decreased R low back pain  Seated trunk flexion with resting forearm on thighs  Transversus abdominis contraction 10x2 with 5 seconds   Seated hip extension isometrics   Alternating 10x each LE  Seated B scapular retraction 10x3 with 5 second holds   Sitting with lumbar towel roll   Feels good for back   Seated clamshell hips less than 90 degrees flexion   Green band 10x2  Seated hip adduction isometrics small blue ball squeeze 10x5 seconds, then 4x5 seconds   Seated press-ups on chair 5 seconds x 2  Decreased low back pressure.  Improved exercise technique, movement at target joints, use of target muscles after mod verbal, visual, tactile cues.      PATIENT EDUCATION:  Education details: there-ex, HEP, POC Person educated: Patient Education method: Explanation, Demonstration, Tactile cues, Verbal cues, and Handouts Education comprehension: verbalized understanding and returned demonstration  HOME EXERCISE PROGRAM: Access Code: [email or web address] URL: [email or web address] Date: 08/08/2023 Prepared by: Loralyn Freshwater  Exercises - Seated Transversus Abdominis Bracing  - 5 x daily - 7 x weekly - 3 sets - 10 reps - 5 seconds hold - Seated Gluteal Sets  - 5 x daily - 7 x weekly - 3 sets - 10 reps - 5 seconds hold - Supine Posterior Pelvic Tilt  - 3 x daily - 7 x weekly - 3 sets - 10 reps - 5 seconds hold  - Seated Scapular Retraction  - 3 x daily - 7 x weekly - 3 sets - 10 reps - 5 seconds hold Sitting with lumbar towel roll   ASSESSMENT:  CLINICAL IMPRESSION:   Pt arrived late so session was adjusted accordingly. Continued working on trunk and glute muscle strengthening to decrease stress to low back. Decreased pain in low back with seated press-ups on chair to decrease lumbar compression pressure. Pt will benefit from continued skilled physical therapy services to decrease pain, improve strength and function.       OBJECTIVE  IMPAIRMENTS: Abnormal gait, difficulty walking, decreased strength, improper body mechanics, postural dysfunction, and pain.   ACTIVITY LIMITATIONS: carrying, lifting, bending, standing, squatting, sleeping, stairs, transfers, and locomotion level  PARTICIPATION LIMITATIONS:   PERSONAL FACTORS: Age, Fitness, Past/current experiences, Profession, Time since onset of injury/illness/exacerbation, and 3+ comorbidities: asthma, chronic kidney disease, DM, HTN, sleep apnea, hysterectomy  are also affecting patient's functional outcome.   REHAB POTENTIAL: Fair    CLINICAL DECISION MAKING: Evolving/moderate complexity Pt states pain has worsened since onset  EVALUATION COMPLEXITY: Moderate   GOALS: Goals reviewed with patient? Yes  SHORT TERM GOALS: Target date: 08/26/2023   Pt will be independent with her initial HEP to decrease pain, improve strength, function, and ability to ambulate, negotiate stairs as well as sleep on her side more comfortably.  Baseline: Pt has started her initial HEP (08/08/2023) Goal status: INITIAL     LONG TERM GOALS: Target date: 10/07/2023  Pt will have a decrease in R and L lateral hip pain to 5/10 or less at worst to promote ability to ambulate, negotiate stairs, as well as lay on her side more comfortably.  Baseline: 10+/10 R and L lateral hip pain at worst for the past 3 months (08/08/2023) Goal status: INITIAL  2.  Pt will improve her B hip extension and abduction strength by at least 1/2 MMT  to promote ability to ambulate and negotiate stairs, more comfortably.  Baseline:  MMT Right eval Left eval  Hip extension (seated manually resisted) 3  3  Hip abduction (seated manually resisted) 3+ (with reproduction of R lateral hip pain) 3+   Goal status: INITIAL  3.  Pt will improve her hip FOTO score by at least 10 points as a demonstration of improved function.  Baseline: Hip FOTO 31 (08/08/2023) Goal status: INITIAL  PLAN:  PT FREQUENCY:  2x/week  PT DURATION: 8 weeks  PLANNED INTERVENTIONS: Therapeutic exercises, Therapeutic activity, Neuromuscular re-education, Gait training, Patient/Family education, Joint mobilization, Joint manipulation, Stair training, Aquatic Therapy, Dry Needling, Electrical stimulation, Spinal mobilization, Manual therapy, and Re-evaluation.  PLAN FOR NEXT SESSION: Posture, trunk and hip strengthening, lumbopelvic control, manual  techniques, modalities PRN   Micaela Stith, PT, DPT 08/22/2023, 4:19 PM

## 2023-08-24 ENCOUNTER — Ambulatory Visit: Payer: BC Managed Care – PPO

## 2023-08-24 DIAGNOSIS — G8929 Other chronic pain: Secondary | ICD-10-CM

## 2023-08-24 DIAGNOSIS — M25562 Pain in left knee: Secondary | ICD-10-CM | POA: Diagnosis not present

## 2023-08-24 DIAGNOSIS — M25561 Pain in right knee: Secondary | ICD-10-CM | POA: Diagnosis not present

## 2023-08-24 DIAGNOSIS — M5459 Other low back pain: Secondary | ICD-10-CM

## 2023-08-24 DIAGNOSIS — M25551 Pain in right hip: Secondary | ICD-10-CM

## 2023-08-24 DIAGNOSIS — R2689 Other abnormalities of gait and mobility: Secondary | ICD-10-CM | POA: Diagnosis not present

## 2023-08-24 DIAGNOSIS — M25552 Pain in left hip: Secondary | ICD-10-CM | POA: Diagnosis not present

## 2023-08-24 NOTE — Therapy (Signed)
OUTPATIENT PHYSICAL THERAPY  Treatment    Patient Name: Raffaela Mcfield MRN: [id number] DOB:10-Aug-1961, 62 y.o., female Today's Date: 08/24/2023  END OF SESSION:  PT End of Session - 08/24/23 0817     Visit Number 6    Number of Visits 17    Date for PT Re-Evaluation 10/07/23    PT Start Time 0817    PT Stop Time 0901    PT Time Calculation (min) 44 min    Activity Tolerance Patient tolerated treatment well    Behavior During Therapy [facility] for tasks assessed/performed                  Past Medical History:  Diagnosis Date   Asthma    Blood clot in vein    Chronic kidney disease    Diabetes mellitus without complication (HCC)    Hyperlipidemia    Hypertension    Kidney stone    Sleep apnea    Past Surgical History:  Procedure Laterality Date   ABDOMINAL HYSTERECTOMY     ABLATION     BREAST RECONSTRUCTION WITH MASTOPLASTY Bilateral 03/11/2020   Procedure: BREAST RECONSTRUCTION WITH MASTOPLASTY;  Surgeon: Eloise Levels, MD;  Location: [facility];  Service: Plastics;  Laterality: Bilateral;   CHOLECYSTECTOMY     COLONOSCOPY WITH PROPOFOL N/A 04/19/2022   Procedure: COLONOSCOPY WITH PROPOFOL;  Surgeon: Toney Reil, MD;  Location[facility] END[facility]COPY;  Service: Gastroenterology;  Laterality: N/A;   REDUCTION MAMMAPLASTY Bilateral    4 yrs ago   ROTATOR CUFF REPAIR Right    TUBAL LIGATION     Patient Active Problem List   Diagnosis Date Noted   OSA (obstructive sleep apnea) 06/09/2023   Aortic atherosclerosis (HCC) 03/15/2022   Hyperlipidemia associated with type 2 diabetes mellitus (HCC) 03/15/2022   Type 2 diabetes mellitus with hyperglycemia, without long-term current use of insulin (HCC) 03/15/2022   Osteoarthritis 03/15/2022   Class 3 severe obesity due to excess calories with body mass index (BMI) of 40.0 to 44.9 in adult [facility]) 03/15/2022   Essential hypertension 12/22/2017    PCP: Lorre Munroe, NP   REFERRING  PROVIDER: Huel Cote, MD  REFERRING DIAG: M25.551 (ICD-10-CM) - Pain in right hip  Rationale for Evaluation and Treatment: Rehabilitation  THERAPY DIAG:  Pain in right hip  Pain in left hip  Other low back pain  Chronic pain of right knee  Chronic pain of left knee  ONSET DATE: years ago  SUBJECTIVE:  SUBJECTIVE STATEMENT: R anterior hip pain is a 4/10 currently; heat helps. 2/10 low back pain currently.    PERTINENT HISTORY:  (Pt currently has limited voice) B hip pain. R hip pain worse than her L. Pain began gradually, years ago. Pain has gotten worse since onset.  Has had sharp burning pain in her L hip for the past 3 days, would happen, then it would quit. Had B steroid shot a few weeks ago which helped for about 2-3 weeks. However, pain returned with a vengeance. Cannot lay on her back due to sleep apnea (has a CPAP machine as well). 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   No latex allergies   Back surgery was a cyst removal and not a lumbar fusion per pt.   PAIN:  Are you having pain? Yes: NPRS scale: See subjective /10 Pain location: B lateral hips R > L Pain description: burning, swelling that is about to burst, sharp, tender, sore, achy.  Aggravating factors: walking (first 2-3 steps), low back pain plays a factor with walking as well, going up steps, laying on R and L sides, standing up from sitting)  Relieving factors: sitting and leaning forward.  Standing and leaning back with feet apart.   PRECAUTIONS: Other: no known precuations  RED FLAGS: Bowel or bladder incontinence: No and Cauda equina syndrome: No   WEIGHT BEARING RESTRICTIONS:  No  FALLS:  Has patient fallen in last 6 months? No  LIVING ENVIRONMENT: Lives with: lives with their family and lives with their daughter (Daughter and grandson lives with pt ) Lives in: House/apartment Stairs: Yes: External: 20 total  steps; on right going up Has following equipment at home: None  OCCUPATION: LPN  PLOF: Independent  PATIENT GOALS: decrease pain  NEXT MD VISIT: not known  OBJECTIVE:  Note: Objective measures were completed at Evaluation unless otherwise noted.  DIAGNOSTIC FINDINGS:  DG HIP UNILAT WITH PELVIS 2-3 VIEWS RIGHT 07/06/2023  CLINICAL DATA:  Bilateral hip pain.  No known injury.   EXAM: DG HIP (WITH OR WITHOUT PELVIS) 2-3V RIGHT; DG HIP (WITH OR WITHOUT PELVIS) 4+V LEFT   COMPARISON:  None Available.   FINDINGS: Right hip: Mild hip joint space narrowing with acetabular spurring. The femoral head is well seated. No fracture. No erosion, evidence of avascular necrosis or focal bone abnormality.   Left hip: Mild hip joint space narrowing. Slight lateral acetabular spurring. The femoral head is well seated. No fracture. No erosion, evidence of avascular necrosis or focal bone abnormality.   Bony pelvis: No focal bone abnormality or bone destruction. Pubic rami are intact. Pubic symphysis and sacroiliac joints are congruent. Tubal ligation clips in the pelvis.   Soft tissue attenuation from habitus limits detailed assessment.   IMPRESSION: Mild osteoarthritis of both hips.     Electronically Signed   By: Narda Rutherford M.D.   On: 07/17/2023 13:52    PATIENT SURVEYS:  FOTO Hip FOTO 31 (08/08/2023)  SCREENING FOR RED FLAGS: Bowel or bladder incontinence: No  Cauda equina syndrome: No  COGNITION: Overall cognitive status: Within functional limits for tasks assessed     SENSATION:   MUSCLE LENGTH:   POSTURE:  B foot pronation, R hip ER, R iliac crest and knee higher L lateral shift, movement preference around L1/L2  area Decreased low back and L1 dermatome pain  with L lateral shift correction  Sit to stand: decreased B femoral control, pt demonstrates B genu valgus and B foot pronation.  Wide stance decreases pressure in low back and B hips    PALPATION: TTP R  > L greater trochanters TTP low back (increased sensitivity)    LUMBAR ROM:   AROM eval  Flexion WFL, decreased low back and B lateral hip pain. Low back pain during the return motion; walked up with hands; aberrant movement   Extension WFL, decreased back and no B lateral hip pain  Right lateral flexion WFL with R hip pain  Left lateral flexion WFL with L low back pain  Right rotation WFL with L low back pain  Left rotation WFL with low back pain   (Blank rows = not tested)  LOWER EXTREMITY ROM:     Active  Right eval Left eval  Hip flexion    Hip extension    Hip abduction    Hip adduction    Hip internal rotation    Hip external rotation    Knee flexion    Knee extension    Ankle dorsiflexion    Ankle plantarflexion    Ankle inversion    Ankle eversion     (Blank rows = not tested)  LOWER EXTREMITY MMT:    MMT Right eval Left eval  Hip flexion    Hip extension (seated manually resisted) 3  3  Hip abduction (seated manually resisted) 3+ (with reproduction of R lateral hip pain) 3+  Hip adduction    Hip internal rotation    Hip external rotation    Knee flexion 4- 4-  Knee extension 4 4+  Ankle dorsiflexion    Ankle plantarflexion    Ankle inversion    Ankle eversion     (Blank rows = not tested)     LUMBAR SPECIAL TESTS:    FUNCTIONAL TESTS:    GAIT: Distance walked:  Assistive device utilized: None Level of assistance: Complete Independence Comments:   TODAY'S TREATMENT:                                                                                                                              DATE: 08/24/2023     Therapeutic exercise  Seated press-ups on chair 5 seconds x 5 for 2  sets  No R hip pain afterwards reported.   Seated manually resisted L lateral shift isometrics in neutral to promote more neutral posture 10x with 5 second holds, then 8x5 seconds holds  R posterior thigh symptoms  Seated manually resisted trunk flexion isometrics in neutral 10x3 with 5 second holds  Sitting with upright posture,   Pallof press, yellow band    R resistance 5x5 seconds    L resistance 5x5 seconds     Difficult, R low back discomfort afterwards    Seated hip extension isometrics   Alternating 4x5 seconds each LE   R anterior hip symptoms  Standing with B UE assist   Hip abduction    R 5x   L 5x   B anterior hip discomfort, eases with trunk flexion position  Standing posterior pelvic tilt 5x5 seconds for 4 sets    Improved exercise technique, movement at target joints, use of target muscles after mod verbal, visual, tactile cues.      PATIENT EDUCATION:  Education details: there-ex, HEP, POC Person educated: Patient Education method: Explanation, Demonstration, Tactile cues, Verbal cues, and Handouts Education comprehension: verbalized understanding and returned demonstration  HOME EXERCISE PROGRAM: Access Code: [email or web address] URL: [email or web address] Date: 08/08/2023 Prepared by: Loralyn Freshwater  Exercises - Seated Transversus Abdominis Bracing  - 5 x daily - 7 x weekly - 3 sets - 10 reps - 5 seconds hold - Seated Gluteal Sets  - 5 x daily - 7 x weekly - 3 sets - 10 reps - 5 seconds hold - Supine Posterior Pelvic Tilt  - 3 x daily - 7 x weekly - 3 sets - 10 reps - 5 seconds hold  - Seated Scapular Retraction  - 3 x daily - 7 x weekly - 3 sets - 10 reps - 5 seconds hold Sitting with lumbar towel roll  - Seated Shoulder Press Ups with Armchair  - 3 x daily - 7 x weekly - 1-3 sets - 5-10 reps - 5 seconds hold     ASSESSMENT:  CLINICAL IMPRESSION:  Worked on trunk strength in neutral upright posture to help decrease stress to low back.  Weak trunk and glute muscles observed. Fair tolerance to today's session. Pt will benefit from continued skilled physical therapy services to decrease pain, improve strength and function.       OBJECTIVE IMPAIRMENTS: Abnormal gait, difficulty walking, decreased strength, improper body mechanics, postural dysfunction, and pain.   ACTIVITY LIMITATIONS: carrying, lifting, bending, standing, squatting, sleeping, stairs, transfers, and locomotion level  PARTICIPATION LIMITATIONS:   PERSONAL FACTORS: Age, Fitness, Past/current experiences, Profession, Time since onset of injury/illness/exacerbation, and 3+ comorbidities: asthma, chronic kidney disease, DM, HTN, sleep apnea, hysterectomy  are also affecting patient's functional outcome.   REHAB POTENTIAL: Fair    CLINICAL DECISION MAKING: Evolving/moderate complexity Pt states pain has worsened since onset  EVALUATION COMPLEXITY: Moderate   GOALS: Goals reviewed with patient? Yes  SHORT TERM GOALS: Target date: 08/26/2023   Pt will be independent with her initial HEP to decrease pain, improve strength, function, and ability to ambulate, negotiate stairs as well as sleep on her side more comfortably.  Baseline: Pt has started her initial HEP (08/08/2023) Goal status: INITIAL     LONG TERM GOALS: Target date: 10/07/2023  Pt will have a decrease in R and L lateral hip pain to 5/10 or less at worst to promote ability to ambulate, negotiate stairs, as well as lay on her side more comfortably.  Baseline: 10+/10 R and L lateral hip pain at worst for the past 3 months (08/08/2023) Goal status: INITIAL  2.  Pt will improve her B hip extension and abduction strength by at least 1/2 MMT  to promote ability to ambulate and negotiate stairs, more comfortably.  Baseline:  MMT Right eval Left eval  Hip extension (seated manually resisted) 3  3  Hip abduction (seated manually resisted) 3+ (with reproduction of R lateral hip pain) 3+   Goal  status: INITIAL  3.  Pt will improve her hip FOTO score by at least 10 points as a demonstration of improved function.  Baseline: Hip FOTO 31 (  08/08/2023) Goal status: INITIAL  PLAN:  PT FREQUENCY: 2x/week  PT DURATION: 8 weeks  PLANNED INTERVENTIONS: Therapeutic exercises, Therapeutic activity, Neuromuscular re-education, Gait training, Patient/Family education, Joint mobilization, Joint manipulation, Stair training, Aquatic Therapy, Dry Needling, Electrical stimulation, Spinal mobilization, Manual therapy, and Re-evaluation.  PLAN FOR NEXT SESSION: Posture, trunk and hip strengthening, lumbopelvic control, manual techniques, modalities PRN   Jacey Pelc, PT, DPT 08/24/2023, 9:49 AM

## 2023-08-31 ENCOUNTER — Ambulatory Visit: Payer: BC Managed Care – PPO

## 2023-08-31 DIAGNOSIS — M25561 Pain in right knee: Secondary | ICD-10-CM | POA: Diagnosis not present

## 2023-08-31 DIAGNOSIS — M25552 Pain in left hip: Secondary | ICD-10-CM

## 2023-08-31 DIAGNOSIS — R2689 Other abnormalities of gait and mobility: Secondary | ICD-10-CM | POA: Diagnosis not present

## 2023-08-31 DIAGNOSIS — G8929 Other chronic pain: Secondary | ICD-10-CM | POA: Diagnosis not present

## 2023-08-31 DIAGNOSIS — M25551 Pain in right hip: Secondary | ICD-10-CM

## 2023-08-31 DIAGNOSIS — M5459 Other low back pain: Secondary | ICD-10-CM

## 2023-08-31 DIAGNOSIS — M25562 Pain in left knee: Secondary | ICD-10-CM | POA: Diagnosis not present

## 2023-08-31 NOTE — Therapy (Signed)
OUTPATIENT PHYSICAL THERAPY  Treatment    Patient Name: Shannon Bryan MRN: [id number] DOB:07/30/1961, 62 y.o., female Today's Date: 08/31/2023  END OF SESSION:  PT End of Session - 08/31/23 1301     Visit Number 7    Number of Visits 17    Date for PT Re-Evaluation 10/07/23    PT Start Time 1301    PT Stop Time 1348    PT Time Calculation (min) 47 min    Activity Tolerance Patient tolerated treatment well    Behavior During Therapy WFL for tasks assessed/performed                   Past Medical History:  Diagnosis Date   Asthma    Blood clot in vein    Chronic kidney disease    Diabetes mellitus without complication (HCC)    Hyperlipidemia    Hypertension    Kidney stone    Sleep apnea    Past Surgical History:  Procedure Laterality Date   ABDOMINAL HYSTERECTOMY     ABLATION     BREAST RECONSTRUCTION WITH MASTOPLASTY Bilateral 03/11/2020   Procedure: BREAST RECONSTRUCTION WITH MASTOPLASTY;  Surgeon: Eloise Levels, MD;  Location: [facility];  Service: Plastics;  Laterality: Bilateral;   CHOLECYSTECTOMY     COLONOSCOPY WITH PROPOFOL N/A 04/19/2022   Procedure: COLONOSCOPY WITH PROPOFOL;  Surgeon: Toney Reil, MD;  Location: [facility] ENDOSCOPY;  Service: Gastroenterology;  Laterality: N/A;   REDUCTION MAMMAPLASTY Bilateral    4 yrs ago   ROTATOR CUFF REPAIR Right    TUBAL LIGATION     Patient Active Problem List   Diagnosis Date Noted   OSA (obstructive sleep apnea) 06/09/2023   Aortic atherosclerosis (HCC) 03/15/2022   Hyperlipidemia associated with type 2 diabetes mellitus (HCC) 03/15/2022   Type 2 diabetes mellitus with hyperglycemia, without long-term current use of insulin (HCC) 03/15/2022   Osteoarthritis 03/15/2022   Class 3 severe obesity due to excess calories with body mass index (BMI) of 40.0 to 44.9 in adult [facility]) 03/15/2022   Essential hypertension 12/22/2017    PCP: Lorre Munroe, NP   REFERRING  PROVIDER: Huel Cote, MD  REFERRING DIAG: M25.551 (ICD-10-CM) - Pain in right hip  Rationale for Evaluation and Treatment: Rehabilitation  THERAPY DIAG:  Pain in right hip  Pain in left hip  Other low back pain  Chronic pain of right knee  Chronic pain of left knee  ONSET DATE: years ago  SUBJECTIVE:  SUBJECTIVE STATEMENT: B knees start to pop when walking down steps. R lateral hip and low back bothers her, 3-4/10 currently    PERTINENT HISTORY:  (Pt currently has limited voice) B hip pain. R hip pain worse than her L. Pain began gradually, years ago. Pain has gotten worse since onset.  Has had sharp burning pain in her L hip for the past 3 days, would happen, then it would quit. Had B steroid shot a few weeks ago which helped for about 2-3 weeks. However, pain returned with a vengeance. Cannot lay on her back due to sleep apnea (has a CPAP machine as well). 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   No latex allergies   Back surgery was a cyst removal and not a lumbar fusion per pt.   PAIN:  Are you having pain? Yes: NPRS scale: See subjective /10 Pain location: B lateral hips R > L Pain description: burning, swelling that is about to burst, sharp, tender, sore, achy.  Aggravating factors: walking (first 2-3 steps), low back pain plays a factor with walking as well, going up steps, laying on R and L sides, standing up from sitting)  Relieving factors: sitting and leaning forward.  Standing and leaning back with feet apart.   PRECAUTIONS: Other: no known precuations  RED FLAGS: Bowel or bladder incontinence: No and Cauda equina syndrome: No   WEIGHT BEARING  RESTRICTIONS: No  FALLS:  Has patient fallen in last 6 months? No  LIVING ENVIRONMENT: Lives with: lives with their family and lives with their daughter (Daughter and grandson lives with pt ) Lives in: House/apartment Stairs: Yes: External: 20 total  steps; on right going up Has following equipment at home: None  OCCUPATION: LPN  PLOF: Independent  PATIENT GOALS: decrease pain  NEXT MD VISIT: not known  OBJECTIVE:  Note: Objective measures were completed at Evaluation unless otherwise noted.  DIAGNOSTIC FINDINGS:  DG HIP UNILAT WITH PELVIS 2-3 VIEWS RIGHT 07/06/2023  CLINICAL DATA:  Bilateral hip pain.  No known injury.   EXAM: DG HIP (WITH OR WITHOUT PELVIS) 2-3V RIGHT; DG HIP (WITH OR WITHOUT PELVIS) 4+V LEFT   COMPARISON:  None Available.   FINDINGS: Right hip: Mild hip joint space narrowing with acetabular spurring. The femoral head is well seated. No fracture. No erosion, evidence of avascular necrosis or focal bone abnormality.   Left hip: Mild hip joint space narrowing. Slight lateral acetabular spurring. The femoral head is well seated. No fracture. No erosion, evidence of avascular necrosis or focal bone abnormality.   Bony pelvis: No focal bone abnormality or bone destruction. Pubic rami are intact. Pubic symphysis and sacroiliac joints are congruent. Tubal ligation clips in the pelvis.   Soft tissue attenuation from habitus limits detailed assessment.   IMPRESSION: Mild osteoarthritis of both hips.     Electronically Signed   By: Narda Rutherford M.D.   On: 07/17/2023 13:52    PATIENT SURVEYS:  FOTO Hip FOTO 31 (08/08/2023)  SCREENING FOR RED FLAGS: Bowel or bladder incontinence: No  Cauda equina syndrome: No  COGNITION: Overall cognitive status: Within functional limits for tasks assessed     SENSATION:   MUSCLE LENGTH:   POSTURE:  B foot pronation, R hip ER, R iliac crest and knee higher L lateral shift, movement preference around  L1/L2 area Decreased low back  and L1 dermatome pain with L lateral shift correction  Sit to stand: decreased B femoral control, pt demonstrates B genu valgus and B foot pronation.  Wide stance decreases pressure in low back and B hips    PALPATION: TTP R  > L greater trochanters TTP low back (increased sensitivity)    LUMBAR ROM:   AROM eval  Flexion WFL, decreased low back and B lateral hip pain. Low back pain during the return motion; walked up with hands; aberrant movement   Extension WFL, decreased back and no B lateral hip pain  Right lateral flexion WFL with R hip pain  Left lateral flexion WFL with L low back pain  Right rotation WFL with L low back pain  Left rotation WFL with low back pain   (Blank rows = not tested)  LOWER EXTREMITY ROM:     Active  Right eval Left eval  Hip flexion    Hip extension    Hip abduction    Hip adduction    Hip internal rotation    Hip external rotation    Knee flexion    Knee extension    Ankle dorsiflexion    Ankle plantarflexion    Ankle inversion    Ankle eversion     (Blank rows = not tested)  LOWER EXTREMITY MMT:    MMT Right eval Left eval  Hip flexion    Hip extension (seated manually resisted) 3  3  Hip abduction (seated manually resisted) 3+ (with reproduction of R lateral hip pain) 3+  Hip adduction    Hip internal rotation    Hip external rotation    Knee flexion 4- 4-  Knee extension 4 4+  Ankle dorsiflexion    Ankle plantarflexion    Ankle inversion    Ankle eversion     (Blank rows = not tested)     LUMBAR SPECIAL TESTS:    FUNCTIONAL TESTS:    GAIT: Distance walked:  Assistive device utilized: None Level of assistance: Complete Independence Comments:   TODAY'S TREATMENT:                                                                                                                              DATE: 08/31/2023    Manual therapy Supine medial glide L patella grade 3 to promote  mobility  Supine McConnel tape to promote medial glide L patella    Therapeutic exercise  Forward step up onto 4 inch step with one UE assist 2x  L patellar pain  After manual therapy   Forward step up onto 4 inch step with one UE assist 5x  Decreased L patellar pain   Static mini lunge with contralateral UE assist   R 10x   L 2x. L latearl knee pain  Seated R piriformis stretch, modified with foot on step 1 min x 3  Standing hip abduction with B UE assist   R 5x2  L 5x  Improved exercise technique, movement at target joints, use of target muscles after mod verbal, visual, tactile cues.      PATIENT EDUCATION:  Education details: there-ex, HEP, POC Person educated: Patient Education method: Explanation, Demonstration, Tactile cues, Verbal cues, and Handouts Education comprehension: verbalized understanding and returned demonstration  HOME EXERCISE PROGRAM: Access Code: [email or web address] URL: [email or web address] Date: 08/08/2023 Prepared by: Loralyn Freshwater  Exercises - Seated Transversus Abdominis Bracing  - 5 x daily - 7 x weekly - 3 sets - 10 reps - 5 seconds hold - Seated Gluteal Sets  - 5 x daily - 7 x weekly - 3 sets - 10 reps - 5 seconds hold - Supine Posterior Pelvic Tilt  - 3 x daily - 7 x weekly - 3 sets - 10 reps - 5 seconds hold  - Seated Scapular Retraction  - 3 x daily - 7 x weekly - 3 sets - 10 reps - 5 seconds hold Sitting with lumbar towel roll  - Seated Shoulder Press Ups with Armchair  - 3 x daily - 7 x weekly - 1-3 sets - 5-10 reps - 5 seconds hold - Seated Piriformis Stretch  - 3 x daily - 7 x weekly - 1 sets - 3-5 reps - 1 minute hold    ASSESSMENT:  CLINICAL IMPRESSION:  Decreased L patellar pain with treatment to promote medial glide. Worked on glute med strengthening and R piriformis stretch to help decrease stress to low back. B hip joint discomfort during standing exercises. Fair tolerance to today's session for her hips.  Decreased knee pain reported after session.  Pt will benefit from continued skilled physical therapy services to decrease pain, improve strength and function.       OBJECTIVE IMPAIRMENTS: Abnormal gait, difficulty walking, decreased strength, improper body mechanics, postural dysfunction, and pain.   ACTIVITY LIMITATIONS: carrying, lifting, bending, standing, squatting, sleeping, stairs, transfers, and locomotion level  PARTICIPATION LIMITATIONS:   PERSONAL FACTORS: Age, Fitness, Past/current experiences, Profession, Time since onset of injury/illness/exacerbation, and 3+ comorbidities: asthma, chronic kidney disease, DM, HTN, sleep apnea, hysterectomy  are also affecting patient's functional outcome.   REHAB POTENTIAL: Fair    CLINICAL DECISION MAKING: Evolving/moderate complexity Pt states pain has worsened since onset  EVALUATION COMPLEXITY: Moderate   GOALS: Goals reviewed with patient? Yes  SHORT TERM GOALS: Target date: 08/26/2023   Pt will be independent with her initial HEP to decrease pain, improve strength, function, and ability to ambulate, negotiate stairs as well as sleep on her side more comfortably.  Baseline: Pt has started her initial HEP (08/08/2023) Goal status: INITIAL     LONG TERM GOALS: Target date: 10/07/2023  Pt will have a decrease in R and L lateral hip pain to 5/10 or less at worst to promote ability to ambulate, negotiate stairs, as well as lay on her side more comfortably.  Baseline: 10+/10 R and L lateral hip pain at worst for the past 3 months (08/08/2023) Goal status: INITIAL  2.  Pt will improve her B hip extension and abduction strength by at least 1/2 MMT  to promote ability to ambulate and negotiate stairs, more comfortably.  Baseline:  MMT Right eval Left eval  Hip extension (seated manually resisted) 3  3  Hip abduction (seated manually resisted) 3+ (with reproduction of R lateral hip pain) 3+   Goal status: INITIAL  3.  Pt will  improve her hip FOTO score by at least 10 points as a demonstration of improved function.  Baseline: Hip  FOTO 31 (08/08/2023) Goal status: INITIAL  PLAN:  PT FREQUENCY: 2x/week  PT DURATION: 8 weeks  PLANNED INTERVENTIONS: Therapeutic exercises, Therapeutic activity, Neuromuscular re-education, Gait training, Patient/Family education, Joint mobilization, Joint manipulation, Stair training, Aquatic Therapy, Dry Needling, Electrical stimulation, Spinal mobilization, Manual therapy, and Re-evaluation.  PLAN FOR NEXT SESSION: Posture, trunk and hip strengthening, lumbopelvic control, manual techniques, modalities PRN   Abcde Oneil, PT, DPT 08/31/2023, 4:14 PM

## 2023-09-05 ENCOUNTER — Ambulatory Visit: Payer: BC Managed Care – PPO | Attending: Orthopaedic Surgery

## 2023-09-05 DIAGNOSIS — G8929 Other chronic pain: Secondary | ICD-10-CM | POA: Insufficient documentation

## 2023-09-05 DIAGNOSIS — M25562 Pain in left knee: Secondary | ICD-10-CM | POA: Insufficient documentation

## 2023-09-05 DIAGNOSIS — M25551 Pain in right hip: Secondary | ICD-10-CM | POA: Diagnosis not present

## 2023-09-05 DIAGNOSIS — M5459 Other low back pain: Secondary | ICD-10-CM | POA: Insufficient documentation

## 2023-09-05 DIAGNOSIS — M25561 Pain in right knee: Secondary | ICD-10-CM | POA: Insufficient documentation

## 2023-09-05 DIAGNOSIS — M25552 Pain in left hip: Secondary | ICD-10-CM | POA: Diagnosis not present

## 2023-09-05 NOTE — Therapy (Signed)
OUTPATIENT PHYSICAL THERAPY  Treatment    Patient Name: Shannon Bryan MRN: [id number] DOB:[birth year], 62 y.o., female Today's Date: 09/05/2023  END OF SESSION:  PT End of Session - 09/05/23 0734     Visit Number 8    Number of Visits 17    Date for PT Re-Evaluation 10/07/23    PT Start Time 0734    PT Stop Time 0816    PT Time Calculation (min) 42 min    Activity Tolerance Patient tolerated treatment well    Behavior During Therapy [facility] for tasks assessed/performed                    Past Medical History:  Diagnosis Date   Asthma    Blood clot in vein    Chronic kidney disease    Diabetes mellitus without complication (HCC)    Hyperlipidemia    Hypertension    Kidney stone    Sleep apnea    Past Surgical History:  Procedure Laterality Date   ABDOMINAL HYSTERECTOMY     ABLATION     BREAST RECONSTRUCTION WITH MASTOPLASTY Bilateral 03/11/2020   Procedure: BREAST RECONSTRUCTION WITH MASTOPLASTY;  Surgeon: Eloise Levels, MD;  Location: [facility];  Service: Plastics;  Laterality: Bilateral;   CHOLECYSTECTOMY     COLONOSCOPY WITH PROPOFOL N/A 04/19/2022   Procedure: COLONOSCOPY WITH PROPOFOL;  Surgeon: Toney Reil, MD;  Location: [facility] ENDOSCOPY;  Service: Gastroenterology;  Laterality: N/A;   REDUCTION MAMMAPLASTY Bilateral    4 yrs ago   ROTATOR CUFF REPAIR Right    TUBAL LIGATION     Patient Active Problem List   Diagnosis Date Noted   OSA (obstructive sleep apnea) 06/09/2023   Aortic atherosclerosis (HCC) 03/15/2022   Hyperlipidemia associated with type 2 diabetes mellitus (HCC) 03/15/2022   Type 2 diabetes mellitus with hyperglycemia, without long-term current use of insulin (HCC) 03/15/2022   Osteoarthritis 03/15/2022   Class 3 severe obesity due to excess calories with body mass index (BMI) of 40.0 to 44.9 in adult [facility]) 03/15/2022   Essential hypertension 12/22/2017    PCP: Lorre Munroe, NP   REFERRING  PROVIDER: Huel Cote, MD  REFERRING DIAG: M25.551 (ICD-10-CM) - Pain in right hip  Rationale for Evaluation and Treatment: Rehabilitation  THERAPY DIAG:  Pain in right hip  Pain in left hip  Other low back pain  Chronic pain of right knee  Chronic pain of left knee  ONSET DATE: years ago  SUBJECTIVE:  SUBJECTIVE STATEMENT: Sick of the B hip and knee pain. Took a tylenol 15-20 minutes ago. 6/10 B hip joint pain currently. R patellar area is a 10/10 currently.  The tape last session helped until it started itchintg     PERTINENT HISTORY:  (Pt currently has limited voice) B hip pain. R hip pain worse than her L. Pain began gradually, years ago. Pain has gotten worse since onset.  Has had sharp burning pain in her L hip for the past 3 days, would happen, then it would quit. Had B steroid shot a few weeks ago which helped for about 2-3 weeks. However, pain returned with a vengeance. Cannot lay on her back due to sleep apnea (has a CPAP machine as well). 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   No latex allergies   Back surgery was a cyst removal and not a lumbar fusion per pt.   PAIN:  Are you having pain? Yes: NPRS scale: See subjective /10 Pain location: B lateral hips R > L Pain description: burning, swelling that is about to burst, sharp, tender, sore, achy.  Aggravating factors: walking (first 2-3 steps), low back pain plays a factor with walking as well, going up steps, laying on R and L sides, standing up from sitting)  Relieving factors: sitting and leaning forward.  Standing and leaning back with feet apart.   PRECAUTIONS: Other: no known precuations  RED  FLAGS: Bowel or bladder incontinence: No and Cauda equina syndrome: No   WEIGHT BEARING RESTRICTIONS: No  FALLS:  Has patient fallen in last 6 months? No  LIVING ENVIRONMENT: Lives with: lives with their family and lives with their daughter (Daughter and grandson lives with pt ) Lives in: House/apartment Stairs: Yes: External: 20 total  steps; on right going up Has following equipment at home: None  OCCUPATION: LPN  PLOF: Independent  PATIENT GOALS: decrease pain  NEXT MD VISIT: not known  OBJECTIVE:  Note: Objective measures were completed at Evaluation unless otherwise noted.  DIAGNOSTIC FINDINGS:  DG HIP UNILAT WITH PELVIS 2-3 VIEWS RIGHT 07/06/2023  CLINICAL DATA:  Bilateral hip pain.  No known injury.   EXAM: DG HIP (WITH OR WITHOUT PELVIS) 2-3V RIGHT; DG HIP (WITH OR WITHOUT PELVIS) 4+V LEFT   COMPARISON:  None Available.   FINDINGS: Right hip: Mild hip joint space narrowing with acetabular spurring. The femoral head is well seated. No fracture. No erosion, evidence of avascular necrosis or focal bone abnormality.   Left hip: Mild hip joint space narrowing. Slight lateral acetabular spurring. The femoral head is well seated. No fracture. No erosion, evidence of avascular necrosis or focal bone abnormality.   Bony pelvis: No focal bone abnormality or bone destruction. Pubic rami are intact. Pubic symphysis and sacroiliac joints are congruent. Tubal ligation clips in the pelvis.   Soft tissue attenuation from habitus limits detailed assessment.   IMPRESSION: Mild osteoarthritis of both hips.     Electronically Signed   By: Narda Rutherford M.D.   On: 07/17/2023 13:52    PATIENT SURVEYS:  FOTO Hip FOTO 31 (08/08/2023)  SCREENING FOR RED FLAGS: Bowel or bladder incontinence: No  Cauda equina syndrome: No  COGNITION: Overall cognitive status: Within functional limits for tasks assessed     SENSATION:   MUSCLE LENGTH:   POSTURE:  B foot  pronation, R  hip ER, R iliac crest and knee higher L lateral shift, movement preference around L1/L2 area Decreased low back and L1 dermatome pain with L lateral shift correction  Sit to stand: decreased B femoral control, pt demonstrates B genu valgus and B foot pronation.  Wide stance decreases pressure in low back and B hips    PALPATION: TTP R  > L greater trochanters TTP low back (increased sensitivity)    LUMBAR ROM:   AROM eval  Flexion WFL, decreased low back and B lateral hip pain. Low back pain during the return motion; walked up with hands; aberrant movement   Extension WFL, decreased back and no B lateral hip pain  Right lateral flexion WFL with R hip pain  Left lateral flexion WFL with L low back pain  Right rotation WFL with L low back pain  Left rotation WFL with low back pain   (Blank rows = not tested)  LOWER EXTREMITY ROM:     Active  Right eval Left eval  Hip flexion    Hip extension    Hip abduction    Hip adduction    Hip internal rotation    Hip external rotation    Knee flexion    Knee extension    Ankle dorsiflexion    Ankle plantarflexion    Ankle inversion    Ankle eversion     (Blank rows = not tested)  LOWER EXTREMITY MMT:    MMT Right eval Left eval  Hip flexion    Hip extension (seated manually resisted) 3  3  Hip abduction (seated manually resisted) 3+ (with reproduction of R lateral hip pain) 3+  Hip adduction    Hip internal rotation    Hip external rotation    Knee flexion 4- 4-  Knee extension 4 4+  Ankle dorsiflexion    Ankle plantarflexion    Ankle inversion    Ankle eversion     (Blank rows = not tested)     LUMBAR SPECIAL TESTS:    FUNCTIONAL TESTS:    GAIT: Distance walked:  Assistive device utilized: None Level of assistance: Complete Independence Comments:   TODAY'S TREATMENT:                                                                                                                               DATE: 09/05/2023    Manual therapy  Reclined    manual IR R knee grade 1 for pain control     Gentle manual long axis B hip traction. Temporary decrease in B hip and low back pain     STM R vastus lateralis to decrease muscle tension  Feels good per pt    sustained medial glide L patella to decrease stress  No back, hip and knee pain reported after session.   Pt states "I stood up without knee pain!"     PATIENT EDUCATION:  Education details: there-ex, HEP, POC Person educated:  Patient Education method: Explanation, Demonstration, Tactile cues, Verbal cues, and Handouts Education comprehension: verbalized understanding and returned demonstration  HOME EXERCISE PROGRAM: Access Code: [email or web address] URL: [email or web address] Date: 08/08/2023 Prepared by: Loralyn Freshwater  Exercises - Seated Transversus Abdominis Bracing  - 5 x daily - 7 x weekly - 3 sets - 10 reps - 5 seconds hold - Seated Gluteal Sets  - 5 x daily - 7 x weekly - 3 sets - 10 reps - 5 seconds hold - Supine Posterior Pelvic Tilt  - 3 x daily - 7 x weekly - 3 sets - 10 reps - 5 seconds hold  - Seated Scapular Retraction  - 3 x daily - 7 x weekly - 3 sets - 10 reps - 5 seconds hold Sitting with lumbar towel roll  - Seated Shoulder Press Ups with Armchair  - 3 x daily - 7 x weekly - 1-3 sets - 5-10 reps - 5 seconds hold - Seated Piriformis Stretch  - 3 x daily - 7 x weekly - 1 sets - 3-5 reps - 1 minute hold    ASSESSMENT:  CLINICAL IMPRESSION:   Pt very uncomfortable with B hip and R knee pain today from work. Focused on manual therapy to decrease pressure to low back, B hip and knee joints and decreasing lateral stress to R patella. No pain reported after session with pt reports being able to stand up afterwards without knee pain. Pt will benefit from continued skilled physical therapy services to decrease pain, improve strength and function.       OBJECTIVE IMPAIRMENTS: Abnormal gait,  difficulty walking, decreased strength, improper body mechanics, postural dysfunction, and pain.   ACTIVITY LIMITATIONS: carrying, lifting, bending, standing, squatting, sleeping, stairs, transfers, and locomotion level  PARTICIPATION LIMITATIONS:   PERSONAL FACTORS: Age, Fitness, Past/current experiences, Profession, Time since onset of injury/illness/exacerbation, and 3+ comorbidities: asthma, chronic kidney disease, DM, HTN, sleep apnea, hysterectomy  are also affecting patient's functional outcome.   REHAB POTENTIAL: Fair    CLINICAL DECISION MAKING: Evolving/moderate complexity Pt states pain has worsened since onset  EVALUATION COMPLEXITY: Moderate   GOALS: Goals reviewed with patient? Yes  SHORT TERM GOALS: Target date: 08/26/2023   Pt will be independent with her initial HEP to decrease pain, improve strength, function, and ability to ambulate, negotiate stairs as well as sleep on her side more comfortably.  Baseline: Pt has started her initial HEP (08/08/2023) Goal status: INITIAL     LONG TERM GOALS: Target date: 10/07/2023  Pt will have a decrease in R and L lateral hip pain to 5/10 or less at worst to promote ability to ambulate, negotiate stairs, as well as lay on her side more comfortably.  Baseline: 10+/10 R and L lateral hip pain at worst for the past 3 months (08/08/2023) Goal status: INITIAL  2.  Pt will improve her B hip extension and abduction strength by at least 1/2 MMT  to promote ability to ambulate and negotiate stairs, more comfortably.  Baseline:  MMT Right eval Left eval  Hip extension (seated manually resisted) 3  3  Hip abduction (seated manually resisted) 3+ (with reproduction of R lateral hip pain) 3+   Goal status: INITIAL  3.  Pt will improve her hip FOTO score by at least 10 points as a demonstration of improved function.  Baseline: Hip FOTO 31 (08/08/2023) Goal status: INITIAL  PLAN:  PT FREQUENCY: 2x/week  PT DURATION: 8  weeks  PLANNED INTERVENTIONS: Therapeutic exercises, Therapeutic activity, Neuromuscular re-education, Gait  training, Patient/Family education, Joint mobilization, Joint manipulation, Stair training, Aquatic Therapy, Dry Needling, Electrical stimulation, Spinal mobilization, Manual therapy, and Re-evaluation.  PLAN FOR NEXT SESSION: Posture, trunk and hip strengthening, lumbopelvic control, manual techniques, modalities PRN   Zyhir Cappella, PT, DPT 09/05/2023, 8:34 AM

## 2023-09-06 DIAGNOSIS — G4733 Obstructive sleep apnea (adult) (pediatric): Secondary | ICD-10-CM | POA: Diagnosis not present

## 2023-09-14 ENCOUNTER — Ambulatory Visit (INDEPENDENT_AMBULATORY_CARE_PROVIDER_SITE_OTHER): Payer: BC Managed Care – PPO | Admitting: Internal Medicine

## 2023-09-14 ENCOUNTER — Encounter: Payer: Self-pay | Admitting: Internal Medicine

## 2023-09-14 VITALS — BP 128/78 | HR 66 | Ht 69.0 in | Wt 300.2 lb

## 2023-09-14 DIAGNOSIS — E1165 Type 2 diabetes mellitus with hyperglycemia: Secondary | ICD-10-CM

## 2023-09-14 DIAGNOSIS — E1169 Type 2 diabetes mellitus with other specified complication: Secondary | ICD-10-CM

## 2023-09-14 DIAGNOSIS — E66813 Obesity, class 3: Secondary | ICD-10-CM | POA: Diagnosis not present

## 2023-09-14 DIAGNOSIS — Z0001 Encounter for general adult medical examination with abnormal findings: Secondary | ICD-10-CM | POA: Diagnosis not present

## 2023-09-14 DIAGNOSIS — E785 Hyperlipidemia, unspecified: Secondary | ICD-10-CM

## 2023-09-14 DIAGNOSIS — Z23 Encounter for immunization: Secondary | ICD-10-CM

## 2023-09-14 DIAGNOSIS — Z6841 Body Mass Index (BMI) 40.0 and over, adult: Secondary | ICD-10-CM

## 2023-09-14 DIAGNOSIS — N959 Unspecified menopausal and perimenopausal disorder: Secondary | ICD-10-CM | POA: Insufficient documentation

## 2023-09-14 MED ORDER — PAROXETINE HCL 20 MG PO TABS: 20.0000 mg | ORAL_TABLET | ORAL | 0 refills | Status: DC

## 2023-09-14 NOTE — Assessment & Plan Note (Signed)
Will trial paroxetine 20 mg daily

## 2023-09-14 NOTE — Assessment & Plan Note (Signed)
C-Met and lipid profile today Encouraged her to consume a low-fat diet

## 2023-09-14 NOTE — Patient Instructions (Signed)

## 2023-09-14 NOTE — Progress Notes (Signed)
Subjective:    Patient ID: Shannon Bryan, female    DOB: [birth year], 62 y.o.   MRN: [id number]  HPI  Patient presents to clinic today for her annual exam.  Flu: 08/2023 Tetanus: unsure COVID: x 4 Pneumovax: 06/2023 Shingrix: 12/2018, 07/2019 Pap smear: Hysterectomy Mammogram: 07/2023 Bone density: never Colon screening: 04/2022 Vision screening: annually, [place] [facility]ist: as needed  Diet: She does eat some meat. She consumes fruits and veggies. She does not eat fried foods. She drinks mostly coffee, water. Exercise: water, juice  Review of Systems     Past Medical History:  Diagnosis Date   Asthma    Blood clot in vein    Chronic kidney disease    Diabetes mellitus without complication (HCC)    Hyperlipidemia    Hypertension    Kidney stone    Sleep apnea     Current Outpatient Medications  Medication Sig Dispense Refill   acetaminophen (TYLENOL) 500 MG tablet Take 500 mg by mouth every 6 (six) hours as needed.     albuterol (VENTOLIN HFA) 108 (90 Base) MCG/ACT inhaler SMARTSIG:2 Puff(s) By Mouth Every 6 Hours PRN     amLODipine (NORVASC) 5 MG tablet Take 1 tablet (5 mg total) by mouth daily. 90 tablet 0   aspirin EC 81 MG tablet Take 81 mg by mouth daily.     atorvastatin (LIPITOR) 20 MG tablet Take 1 tablet (20 mg total) by mouth daily. 90 tablet 0   carvedilol (COREG) 6.25 MG tablet Take 1 tablet (6.25 mg total) by mouth 2 (two) times daily. 180 tablet 3   cetirizine (ZYRTEC) 10 MG tablet Take 10 mg by mouth daily.     hydrochlorothiazide (HYDRODIURIL) 25 MG tablet Take 1 tablet (25 mg total) by mouth daily. 90 tablet 3   ibuprofen (ADVIL) 800 MG tablet Take 1 tablet by mouth three times daily as needed 90 tablet 0   metFORMIN (GLUCOPHAGE) 1000 MG tablet Take 1 tablet (1,000 mg total) by mouth 2 (two) times daily with a meal. 180 tablet 0   methocarbamol (ROBAXIN) 500 MG tablet Take 1 tablet (500 mg total) by mouth 4 (four) times daily. 30 tablet 3   Multiple  Vitamins-Minerals (ONE-A-DAY 50 PLUS PO) Take 1 tablet by mouth daily.     nystatin cream (MYCOSTATIN) Apply 1 Application topically 3 (three) times daily. 30 g 0   potassium chloride SA (KLOR-CON M20) 20 MEQ tablet Take 1 tablet (20 mEq total) by mouth daily. 90 tablet 3   spironolactone (ALDACTONE) 25 MG tablet Take 1 tablet (25 mg total) by mouth daily. 90 tablet 0   losartan (COZAAR) 25 MG tablet Take 1 tablet (25 mg total) by mouth daily. 90 tablet 3   No current facility-administered medications for this visit.    Allergies  Allergen Reactions   Bactrim [Sulfamethoxazole-Trimethoprim] Hives   Penicillins Swelling and Other (See Comments)   Sulfa Antibiotics Other (See Comments)   Cleocin [Clindamycin Hcl] Rash    Family History  Problem Relation Age of Onset   Heart disease Mother    Stroke Mother    Diabetes Mother    Cancer Mother        bone   Depression Mother    Diabetes Father    Emphysema Father    Breast cancer Neg Hx     Social History   Socioeconomic History   Marital status: Single    Spouse name: Not on file   Number of children: Not on file  Years of education: Not on file   Highest education level: Not on file  Occupational History   Not on file  Tobacco Use   Smoking status: Some Days    Current packs/day: 0.00    Average packs/day: 0.3 packs/day for 30.0 years (7.5 ttl pk-yrs)    Types: Cigarettes    Start date: 02/02/1990    Last attempt to quit: 02/03/2020    Years since quitting: 3.6   Smokeless tobacco: Former   Tobacco comments:    1/4 - 1/2 ppd had quit for 1 year in 2010  Vaping Use   Vaping status: Some Days   Devices: Vaping  Substance and Sexual Activity   Alcohol use: Yes    Alcohol/week: 1.0 standard drink of alcohol    Types: 1 Glasses of wine per week    Comment: occasional   Drug use: No   Sexual activity: Never  Other Topics Concern   Not on file  Social History Narrative   Not on file   Social Determinants of Health    Financial Resource Strain: Not on file  Food Insecurity: Not on file  Transportation Needs: Not on file  Physical Activity: Not on file  Stress: Not on file  Social Connections: Not on file  Intimate Partner Violence: Not on file     Constitutional: Denies fever, malaise, fatigue, headache or abrupt weight changes.  HEENT: Denies eye pain, eye redness, ear pain, ringing in the ears, wax buildup, runny nose, nasal congestion, bloody nose, or sore throat. Respiratory: Denies difficulty breathing, shortness of breath, cough or sputum production.   Cardiovascular: Denies chest pain, chest tightness, palpitations or swelling in the hands or feet.  Gastrointestinal: Pt reports reflux. Denies abdominal pain, bloating, constipation, diarrhea or blood in the stool.  GU: Denies urgency, frequency, pain with urination, burning sensation, blood in urine, odor or discharge. Musculoskeletal: Patient reports joint pain.  Denies decrease in range of motion, difficulty with gait, muscle pain or joint swelling.  Skin: Denies redness, rashes, lesions or ulcercations.  Neurological: Pt reports neuropathic pain. Denies dizziness, difficulty with memory, difficulty with speech or problems with balance and coordination.  Psych: Pt reports irritability. Denies anxiety, depression, SI/HI.  No other specific complaints in a complete review of systems (except as listed in HPI above).  Objective:   Physical Exam   BP 128/78 (BP Location: Left Arm, Cuff Size: Large)   Pulse 66   Ht 5\' 9"  (1.753 m)   Wt (!) 300 lb 3.2 oz (136.2 kg)   SpO2 100%   BMI 44.33 kg/m  Wt Readings from Last 3 Encounters:  09/14/23 (!) 300 lb 3.2 oz (136.2 kg)  07/12/23 291 lb 9.6 oz (132.3 kg)  06/09/23 298 lb (135.2 kg)    General: Appears her stated age, obese, in NAD. Skin: Warm, dry and intact. No ulcerations noted. HEENT: Head: normal shape and size; Eyes: sclera white, no icterus, conjunctiva pink, PERRLA and EOMs  intact;  Neck:  Neck supple, trachea midline. No masses, lumps or thyromegaly present.  Cardiovascular: Normal rate and rhythm. S1,S2 noted.  No murmur, rubs or gallops noted. No JVD. Trace pitting BLE edema. No carotid bruits noted. Pulmonary/Chest: Normal effort and positive vesicular breath sounds. No respiratory distress. No wheezes, rales or ronchi noted.  Abdomen: Soft and nontender. Normal bowel sounds.  Musculoskeletal: Strength 5/5 BUE/BLE. Gait slow and steady without device. Neurological: Alert and oriented. Cranial nerves II-XII grossly intact. Coordination normal.  Psychiatric: Mood and affect  normal. Behavior is normal. Judgment and thought content normal.     BMET    Component Value Date/Time   NA 138 06/09/2023 0905   NA 141 06/11/2021 0927   K 4.0 06/09/2023 0905   CL 104 06/09/2023 0905   CO2 24 06/09/2023 0905   GLUCOSE 186 (H) 06/09/2023 0905   BUN 11 06/09/2023 0905   BUN 12 06/11/2021 0927   CREATININE 0.90 06/09/2023 0905   CALCIUM 9.3 06/09/2023 0905   GFRNONAA >60 03/31/2023 1049   GFRNONAA 70 01/05/2019 1101   GFRAA >60 06/20/2020 1936   GFRAA 81 01/05/2019 1101    Lipid Panel     Component Value Date/Time   CHOL 188 06/09/2023 0905   TRIG 108 06/09/2023 0905   HDL 38 (L) 06/09/2023 0905   CHOLHDL 4.9 06/09/2023 0905   VLDL 20 03/29/2017 0833   LDLCALC 128 (H) 06/09/2023 0905    CBC    Component Value Date/Time   WBC 7.4 06/09/2023 0905   RBC 5.54 (H) 06/09/2023 0905   HGB 14.7 06/09/2023 0905   HCT 44.7 06/09/2023 0905   PLT 293 06/09/2023 0905   MCV 80.7 06/09/2023 0905   MCH 26.5 (L) 06/09/2023 0905   MCHC 32.9 06/09/2023 0905   RDW 14.5 06/09/2023 0905   LYMPHSABS 2.8 01/18/2023 0814   MONOABS 0.7 01/18/2023 0814   EOSABS 0.1 01/18/2023 0814   BASOSABS 0.0 01/18/2023 0814    Hgb A1C Lab Results  Component Value Date   HGBA1C 8.7 (A) 06/09/2023          Assessment & Plan:   Preventative health maintenance:  Flu shot  UTD Tetanus today Encouraged her to get her COVID-vaccine Pneumovax UTD Shingrix UTD She no longer needs to screen for cervical cancer Mammogram UTD Bone density declined at this time Colon screening UTD Encouraged her to consume a balanced diet and exercise regimen Advised her to see an eye doctor and dentist annually We will check CBC, c-Met, lipid, A1c today  RTC in 3 months, follow-up chronic conditions Nicki Reaper, NP

## 2023-09-14 NOTE — Assessment & Plan Note (Signed)
Encouraged diet and exercise for weight loss ?

## 2023-09-15 ENCOUNTER — Telehealth: Payer: Self-pay | Admitting: Internal Medicine

## 2023-09-15 LAB — CBC
HCT: 39.5 % (ref 35.0–45.0)
Hemoglobin: 12.8 g/dL (ref 11.7–15.5)
MCH: 27.3 pg (ref 27.0–33.0)
MCHC: 32.4 g/dL (ref 32.0–36.0)
MCV: 84.2 fL (ref 80.0–100.0)
MPV: 10.9 fL (ref 7.5–12.5)
Platelets: 298 10*3/uL (ref 140–400)
RBC: 4.69 10*6/uL (ref 3.80–5.10)
RDW: 15.4 % — ABNORMAL HIGH (ref 11.0–15.0)
WBC: 10.6 10*3/uL (ref 3.8–10.8)

## 2023-09-15 LAB — COMPLETE METABOLIC PANEL WITH GFR
AG Ratio: 1.4 (calc) (ref 1.0–2.5)
ALT: 18 U/L (ref 6–29)
AST: 14 U/L (ref 10–35)
Albumin: 4 g/dL (ref 3.6–5.1)
Alkaline phosphatase (APISO): 78 U/L (ref 37–153)
BUN: 12 mg/dL (ref 7–25)
CO2: 26 mmol/L (ref 20–32)
Calcium: 9.2 mg/dL (ref 8.6–10.4)
Chloride: 103 mmol/L (ref 98–110)
Creat: 0.82 mg/dL (ref 0.50–1.05)
Globulin: 2.8 g/dL (ref 1.9–3.7)
Glucose, Bld: 187 mg/dL — ABNORMAL HIGH (ref 65–139)
Potassium: 4 mmol/L (ref 3.5–5.3)
Sodium: 139 mmol/L (ref 135–146)
Total Bilirubin: 0.2 mg/dL (ref 0.2–1.2)
Total Protein: 6.8 g/dL (ref 6.1–8.1)
eGFR: 82 mL/min/{1.73_m2} (ref >=60)

## 2023-09-15 LAB — HEMOGLOBIN A1C
Hgb A1c MFr Bld: 8.5 %{Hb} — ABNORMAL HIGH (ref <5.7)
Mean Plasma Glucose: 197 mg/dL
eAG (mmol/L): 10.9 mmol/L

## 2023-09-15 LAB — LIPID PANEL
Cholesterol: 146 mg/dL (ref <200)
HDL: 43 mg/dL — ABNORMAL LOW (ref >=50)
LDL Cholesterol (Calc): 83 mg/dL
Non-HDL Cholesterol (Calc): 103 mg/dL (ref <130)
Total CHOL/HDL Ratio: 3.4 (calc) (ref <5.0)
Triglycerides: 104 mg/dL (ref <150)

## 2023-09-15 NOTE — Telephone Encounter (Signed)
Will process accordingly

## 2023-09-15 NOTE — Telephone Encounter (Signed)
Medication Refill -  Most Recent Primary Care Visit:  Provider: Lorre Munroe  Department: [facility]  Visit Type: PHYSICAL  Date: 09/14/2023  Medication: Ozempic  Has the patient contacted their pharmacy? Yes (Agent: If yes, when and what did the pharmacy advise?)  Patient states she was told by pharmacy that she needs an authorization from provider to get the ozempic. Patient states she has never had the prescription and every time she attempts to pick it up she is told the provider needs to fill something out for the prescription to be approved.   Is this the correct pharmacy for this prescription? Yes This is the patient's preferred pharmacy:  [facility] [address] ([address]), [place] - [address] [place]) [place] [address] Phone: [phone] Fax: [phone]   Has the prescription been filled recently? No  Is the patient out of the medication? Yes  Has the patient been seen for an appointment in the last year OR does the patient have an upcoming appointment? Yes  Can we respond through MyChart? Yes  Agent: Please be advised that Rx refills may take up to 3 business days. We ask that you follow-up with your pharmacy.

## 2023-10-14 ENCOUNTER — Ambulatory Visit: Payer: BC Managed Care – PPO | Admitting: Cardiology

## 2023-11-07 ENCOUNTER — Telehealth: Payer: Self-pay | Admitting: Pharmacist

## 2023-11-07 NOTE — Progress Notes (Signed)
 PA started   Shannon Bryan (KeyDenyse Dago) Rx #: [id number] Ozempic (0.25 or 0.5 MG/DOSE) 2MG /3ML pen-injectors

## 2023-11-07 NOTE — Progress Notes (Signed)
   11/07/2023  Patient ID: Shannon Bryan, female   DOB: 1961/08/10, 63 y.o.   MRN: [id number]   Patient appearing on report for [facility] - Diabetes Control report due to last documented A1C of 8.5% on 09/14/2023. Next appointment with PCP is 12/15/2023   Outreached patient to discuss diabetes control and medication management.   Was unable to reach patient via telephone today and have left HIPAA compliant voicemail asking patient to return my call   Sharyle Sia, PharmD, [facility] Clinical Pharmacist [facility] [phone]

## 2023-11-09 ENCOUNTER — Encounter: Payer: Self-pay | Admitting: Pharmacist

## 2023-11-09 ENCOUNTER — Telehealth: Payer: Self-pay | Admitting: Pharmacist

## 2023-11-09 ENCOUNTER — Other Ambulatory Visit: Payer: Self-pay | Admitting: Pharmacist

## 2023-11-09 DIAGNOSIS — E1165 Type 2 diabetes mellitus with hyperglycemia: Secondary | ICD-10-CM

## 2023-11-09 MED ORDER — ONETOUCH VERIO VI STRP
ORAL_STRIP | 3 refills | Status: AC
Start: 2023-11-09 — End: ?

## 2023-11-09 MED ORDER — ONETOUCH VERIO FLEX SYSTEM W/DEVICE KIT
PACK | 0 refills | Status: AC
Start: 2023-11-09 — End: ?

## 2023-11-09 MED ORDER — ONETOUCH DELICA PLUS LANCET30G MISC
3 refills | Status: AC
Start: 2023-11-09 — End: ?

## 2023-11-09 NOTE — Progress Notes (Signed)
   11/09/2023  Patient ID: Shannon Bryan, female   DOB: December 17, 1960, 63 y.o.   MRN: [id number]   Patient appearing on report for [facility] - Diabetes Control report due to last documented A1C of 8.5% on 09/14/2023. Next appointment with PCP is 12/15/2023   Outreached patient to discuss diabetes control and medication management.   Was unable to reach patient via telephone today and unable to leave a message as patient's voicemail box is full. Outreach attempt #2.   Sharyle Sia, PharmD, [facility] Clinical Pharmacist [facility] [phone]

## 2023-11-09 NOTE — Progress Notes (Signed)
 Patient requesting renewal of her metformin , albuterol , amlodipine , atorvastatin , losartan , potassium and spironolactone .  Would you please consider sending these to [facility] for her?  Sharyle Sia, PharmD, [facility] Clinical Pharmacist [facility] [phone]

## 2023-11-09 NOTE — Patient Instructions (Addendum)
 Goals Addressed             This Visit's Progress    Pharmacy Goals       The goal A1c is less than 7%. This is the best way to reduce the risk of the long term complications of diabetes, including heart disease, kidney disease, eye disease, strokes, and nerve damage. An A1c of less than 7% corresponds with fasting sugars less than 130 and 2 hour after meal sugars less than 180. Please check your blood sugar daily.   Our goal bad cholesterol, or LDL, is less than 70 . This is why it is important to continue taking your atorvastatin.  Check your blood pressure twice weekly, and any time you have concerning symptoms like headache, chest pain, dizziness, shortness of breath, or vision changes.   Our goal is less than 130/80.  To appropriately check your blood pressure, make sure you do the following:  1) Avoid caffeine, exercise, or tobacco products for 30 minutes before checking. Empty your bladder. 2) Sit with your back supported in a flat-backed chair. Rest your arm on something flat (arm of the chair, table, etc). 3) Sit still with your feet flat on the floor, resting, for at least 5 minutes.  4) Check your blood pressure. Take 1-2 readings.  5) Write down these readings and bring with you to any provider appointments.  Bring your home blood pressure machine with you to a provider's office for accuracy comparison at least once a year.   Make sure you take your blood pressure medications before you come to any office visit, even if you were asked to fast for labs.  Arthur Lash, PharmD, [facility] Clinical Pharmacist [facility] [phone]

## 2023-11-09 NOTE — Progress Notes (Addendum)
 11/09/2023 Name: Shannon Bryan MRN: [id number] DOB: June 09, 1961  Chief Complaint  Patient presents with   Medication Management    Shannon Bryan is a 63 y.o. year old female who presented for a telephone visit.   Patient appearing on report for [facility] - Diabetes Control report due to last documented A1C of 8.5% on 09/14/2023. Next appointment with PCP is 12/15/2023    Outreached patient to discuss diabetes control and medication management.    Subjective:  Care Team: Primary Care Provider: Antonette Angeline ORN, NP ; Next Scheduled Visit: 12/15/2023 Cardiologist: Deatrice Cage, MD; Next Scheduled Visit: 11/21/2023  Medication Access/Adherence  Current Pharmacy:  [facility] [address] ([address]), [place] - [address] [place] ([address]) [place] [address] Phone: [phone] Fax: [phone]   Patient reports affordability concerns with their medications: Yes  Patient reports access/transportation concerns to their pharmacy: No  Patient reports adherence concerns with their medications:  No    Denies using weekly pillbox to organize her medications; denies missed doses  Diabetes:  Current medications: metformin  1000 mg twice daily   Medications tried in the past: Ozempic  (unable to afford due to cost/PA needed)  Reports would like to start Ozempic  as previously prescribed by PCP, but that she was unable to start this prescription as prior authorization was needed  Current glucose readings: last checked last night before supper: 179 - Reports in need of a new blood sugar meter  Reports has made significant changes to her diet to have well-balanced meals spread throughout the day, while limiting carbohydrate portion sizes  Current physical activity: 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 and does home PT exercise to build up thigh muscles     Objective:  Lab Results  Component Value Date    HGBA1C 8.5 (H) 09/14/2023    Lab Results  Component Value Date   CREATININE 0.82 09/14/2023   BUN 12 09/14/2023   NA 139 09/14/2023   K 4.0 09/14/2023   CL 103 09/14/2023   CO2 26 09/14/2023    Lab Results  Component Value Date   CHOL 146 09/14/2023   HDL 43 (L) 09/14/2023   LDLCALC 83 09/14/2023   TRIG 104 09/14/2023   CHOLHDL 3.4 09/14/2023    Medications Reviewed Today     Reviewed by Alana Sharyle LABOR, RPH-CPP (Pharmacist) on 11/09/23 at 1658  Med List Status: <None>   Medication Order Taking? Sig Documenting Provider Last Dose Status Informant  acetaminophen  (TYLENOL ) 500 MG tablet [id number] Yes Take 500 mg by mouth every 6 (six) hours as needed. [provider] Taking Active   albuterol  (VENTOLIN  HFA) 108 (90 Base) MCG/ACT inhaler [id number]  SMARTSIG:2 Puff(s) By Mouth Every 6 Hours PRN [provider]  Active   amLODipine  (NORVASC ) 5 MG tablet [id number] Yes Take 1 tablet (5 mg total) by mouth daily. Cage Deatrice LABOR, MD Taking Active   aspirin EC 81 MG tablet [id number] Yes Take 81 mg by mouth daily. [provider] Taking Active   atorvastatin  (LIPITOR) 20 MG tablet [id number] Yes Take 1 tablet (20 mg total) by mouth daily. Antonette Angeline ORN, NP Taking Active   carvedilol  (COREG ) 6.25 MG tablet [id number] Yes Take 1 tablet (6.25 mg total) by mouth 2 (two) times daily. Cage Deatrice LABOR, MD Taking Active   cetirizine  (ZYRTEC ) 10 MG tablet [id number] Yes Take 10 mg by mouth daily. [provider] Taking Active   hydrochlorothiazide  (HYDRODIURIL )  25 MG tablet [id number] Yes Take 1 tablet (25 mg total) by mouth daily. Darron Deatrice LABOR, MD Taking Active   ibuprofen  (ADVIL ) 800 MG tablet [id number]  Take 1 tablet by mouth three times daily as needed Antonette Angeline ORN, NP  Active   losartan  (COZAAR ) 25 MG tablet [id number] Yes Take 1 tablet (25 mg total) by mouth daily. Gerard Frederick, NP Taking Active   metFORMIN  (GLUCOPHAGE ) 1000 MG tablet [id number]  Yes Take 1 tablet (1,000 mg total) by mouth 2 (two) times daily with a meal. Antonette Angeline ORN, NP Taking Active   methocarbamol  (ROBAXIN ) 500 MG tablet [id number] Yes Take 1 tablet (500 mg total) by mouth 4 (four) times daily. Genelle Standing, MD Taking Active   Multiple Vitamins-Minerals (ONE-A-DAY 50 PLUS PO) [id number] Yes Take 1 tablet by mouth daily. [provider] Taking Active   nystatin  cream (MYCOSTATIN ) [id number]  Apply 1 Application topically 3 (three) times daily. Gerard Frederick, NP  Active   PARoxetine  (PAXIL ) 20 MG tablet [id number] Yes Take 1 tablet (20 mg total) by mouth every morning. Antonette Angeline ORN, NP Taking Active   potassium chloride  SA (KLOR-CON  M20) 20 MEQ tablet [id number] Yes Take 1 tablet (20 mEq total) by mouth daily. Gerard Frederick, NP Taking Active   spironolactone  (ALDACTONE ) 25 MG tablet [id number] Yes Take 1 tablet (25 mg total) by mouth daily. Darron Deatrice LABOR, MD Taking Active               Assessment/Plan:   Comprehensive medication review performed; medication list updated in electronic medical record  Will collaborate with PCP to request renewals of metformin , albuterol , atorvastatin  as requested by patient  Discuss importance of medication adherence. Encourage patient to consider using weekly pillbox as adherence aid   Diabetes: - Currently uncontrolled - Reviewed long term cardiovascular and renal outcomes of uncontrolled blood sugar - Reviewed goal A1c, goal fasting, and goal 2 hour post prandial glucose - Reviewed dietary modifications including importance of having regular well-balanced meals and snacks throughout the day, while controlling carbohydrate portion sizes  Encourage patient to avoid sugary beverages - Will send prescriptions for glucometer and supplies to pharmacy for patient per protocol Will send patient link to how to use video from manufacturer for One Touch Verio meter via MyChart as requested - Recommend to check  glucose, keep log of results and have this record to review at upcoming medical appointments. Patient to contact provider office sooner if needed for readings outside of established parameters or symptoms - Follow up with patient's [facility] and confirm Ozempic  prescription still needing a prior authorization  Submit PA request via covermymeds on behalf of patient today.    Follow Up Plan: Clinical Pharmacist will follow up with patient by telephone within the next 7 days  Sharyle Sia, PharmD, [facility] Clinical Pharmacist [facility] [phone]

## 2023-11-10 ENCOUNTER — Other Ambulatory Visit: Payer: Self-pay | Admitting: Internal Medicine

## 2023-11-10 MED ORDER — ALBUTEROL SULFATE HFA 108 (90 BASE) MCG/ACT IN AERS: 1.0000 | INHALATION_SPRAY | RESPIRATORY_TRACT | 2 refills | Status: AC | PRN

## 2023-11-10 MED ORDER — METFORMIN HCL 1000 MG PO TABS: 1000.0000 mg | ORAL_TABLET | Freq: Two times a day (BID) | ORAL | 1 refills | Status: AC

## 2023-11-10 MED ORDER — ATORVASTATIN CALCIUM 20 MG PO TABS: 20.0000 mg | ORAL_TABLET | Freq: Every day | ORAL | 1 refills | Status: DC

## 2023-11-10 NOTE — Progress Notes (Signed)
 Ozempic approved   11/09/23-11/08/24

## 2023-11-10 NOTE — Telephone Encounter (Signed)
 Refilled

## 2023-11-11 ENCOUNTER — Other Ambulatory Visit: Payer: Self-pay | Admitting: Pharmacist

## 2023-11-11 DIAGNOSIS — E1165 Type 2 diabetes mellitus with hyperglycemia: Secondary | ICD-10-CM

## 2023-11-11 NOTE — Progress Notes (Signed)
   11/11/2023  Patient ID: Shannon Bryan, female   DOB: 1961-10-12, 63 y.o.   MRN: [id number]   Shannon Bryan is a 63 y.o. year old female who presented for a telephone visit.   Patient appearing on report for [facility] - Diabetes Control report due to last documented A1C of 8.5% on 09/14/2023. Next appointment with PCP is 12/15/2023    Outreached patient to discuss diabetes control and medication management.     Prior authorization for Ozempic  approved through 11/08/2024  Follow up with [facility] to provide update. Pharmacy advises will fill prescription for patient and her copayment is $24.99  Follow up with patient to provide update. Counseled on Ozempic , including mechanism of action, side effects, and benefits. No personal or family history of medullary thyroid  cancer, personal history of pancreatitis or gallbladder disease. Counseled on potential side effects of nausea, stomach upset, queasiness, constipation, and that these generally improve over time. Advised to contact our office with more severe symptoms, including nausea, diarrhea, stomach pain. Patient verbalized understanding. - Reports will follow up with pharmacy to pick up and start Ozempic  0.25 mg weekly x 4 weeks then increase to 0.5mg  weekly, as directed by PCP  Follow Up Plan: Clinical Pharmacist will follow up with patient by telephone on 01/09/2024 at 1:00 PM   Sharyle Sia, PharmD, [facility] Clinical Pharmacist [facility] [phone]

## 2023-11-11 NOTE — Patient Instructions (Signed)
 Goals Addressed             This Visit's Progress    Pharmacy Goals       The goal A1c is less than 7%. This is the best way to reduce the risk of the long term complications of diabetes, including heart disease, kidney disease, eye disease, strokes, and nerve damage. An A1c of less than 7% corresponds with fasting sugars less than 130 and 2 hour after meal sugars less than 180. Please check your blood sugar daily.   Our goal bad cholesterol, or LDL, is less than 70 . This is why it is important to continue taking your atorvastatin.  Check your blood pressure twice weekly, and any time you have concerning symptoms like headache, chest pain, dizziness, shortness of breath, or vision changes.   Our goal is less than 130/80.  To appropriately check your blood pressure, make sure you do the following:  1) Avoid caffeine, exercise, or tobacco products for 30 minutes before checking. Empty your bladder. 2) Sit with your back supported in a flat-backed chair. Rest your arm on something flat (arm of the chair, table, etc). 3) Sit still with your feet flat on the floor, resting, for at least 5 minutes.  4) Check your blood pressure. Take 1-2 readings.  5) Write down these readings and bring with you to any provider appointments.  Bring your home blood pressure machine with you to a provider's office for accuracy comparison at least once a year.   Make sure you take your blood pressure medications before you come to any office visit, even if you were asked to fast for labs.  Arthur Lash, PharmD, [facility] Clinical Pharmacist [facility] [phone]

## 2023-11-20 NOTE — Progress Notes (Unsigned)
Cardiology Office Note:  .   Date:  11/21/2023  ID:  Shannon Bryan, DOB 1961-10-29, MRN [id number] PCP: Lorre Munroe, NP  [facility] Providers Cardiologist:  Lorine Bears, MD Sleep Medicine:  Armanda Magic, MD    History of Present Illness: .   Shannon Bryan is a 63 y.o. female with a past medical history of essential hypertension, hyperlipidemia, tobacco use, and obesity, who is here today for follow-up.   She was originally seen in March 2020 for worsening exertional dyspnea and chest pain.  She had uncontrolled hypertension with blood pressures of 200s systolic.  At that time she was on HCTZ 25 mg daily and started on carvedilol 625 mg twice daily.  Lexiscan MPI showed fixed anterior wall defect likely due to breast attenuation.  Ejection fraction on echocardiogram was 55-60%, no significant valvular abnormalities or pulmonary hypertension.  With worsening shortness of breath and chest pain she was seen in July 2022 and underwent repeat echocardiogram which revealed normal LV systolic function with no significant valvular abnormalities.  She also underwent cardiac CT in August 2022 which showed a calcium score of 0 with no evidence of obstructive coronary artery disease.  Home sleep study confirms sleep apnea.  She also stopped smoking within the year before.   Seen in clinic 05/21/2023 with complaints of palpitations and pounding heart heart rates.  She denied any chest pain or worsening shortness of breath.  She had not had any visits to the hospital or the emergency department.  Her last hemoglobin A1c was noted to be 9.1 today inability for her to prick her finger.  She was to be placed on a ZIO XT monitor for 2 weeks to rule out arrhythmia.  She was also started on losartan 25 mg daily for blood pressure with a repeat BMP in 2 weeks.   She was last seen in clinic 07/12/2023 overall feeling well.  She had 1 episode of dizziness.  Blood pressures were being very well-controlled  on a new medication blood work been stable.  She thought her dizziness was related to recently being placed on Robaxin.  There were no medication changes that were made and no further testing that was ordered at the time.  She returns to clinic today stating that overall she has been doing fairly well.  Blood pressure is elevated today after chasing her pitbull prior to arriving to her appointment and taking her medication late.  She states she has been compliant with her medication and has had no missed doses.  She denies any chest pain or worsening shortness of breath.  She continues to have palpitations that she stated have been unchanged in intensity and that they are much harder than what they were previously.  She is unsure if it is related to her heart or her breathing.  She continues to vape but has discontinued the use of nicotine and her vapes.  She also underwent a fall where she got up out of the bed and fell. Her left lower extremity has extensive bruising noted.  She denies any loss of consciousness or hitting her head with the fall.  She denies any hospitalization or visits to the emergency department.   ROS: 10 point review of system has been reviewed and considered negative with exception was been listed in the HPI  Studies Reviewed: Marland Kitchen   EKG Interpretation Date/Time:  Monday November 21 2023 09:33:58 EST Ventricular Rate:  55 PR Interval:  204 QRS Duration:  88 QT Interval:  466 QTC Calculation: 445 R Axis:   7  Text Interpretation: Sinus bradycardia Cannot rule out Anterior infarct , age undetermined When compared with ECG of 31-May-2023 14:27, No significant change was found Confirmed by Charlsie Quest ([id number]) on 11/21/2023 9:34:52 AM    Heart Monitor 06/24/23 Patient had a min HR of 49 bpm, max HR of 165 bpm, and avg HR of 76 bpm. Predominant underlying rhythm was Sinus Rhythm.  2 Supraventricular Tachycardia runs occurred, the run with the fastest interval lasting 4 beats with a  max rate of 152 bpm, the longest lasting 22.4 secs with an avg rate of 121 bpm. Supraventricular Tachycardia was detected within +/- 45 seconds of symptomatic patient event(s).  Rare PACs and rare PVCs. Many triggered events did not correlate with arrhythmia.   TTE 05/24/23 1. Left ventricular ejection fraction, by estimation, is 60 to 65%. Left  ventricular ejection fraction by PLAX is 58 %. The left ventricle has  normal function. The left ventricle has no regional wall motion  abnormalities. There is mild left ventricular  hypertrophy. Left ventricular diastolic parameters are consistent with  Grade I diastolic dysfunction (impaired relaxation).   2. Right ventricular systolic function is normal. The right ventricular  size is normal.   3. The mitral valve is normal in structure. No evidence of mitral valve  regurgitation. No evidence of mitral stenosis.   4. The aortic valve has an indeterminant number of cusps. Aortic valve  regurgitation is mild. No aortic stenosis is present.   5. The inferior vena cava is normal in size with greater than 50%  respiratory variability, suggesting right atrial pressure of 3 mmHg.    Coronary CTA 06/18/21 IMPRESSION: 1. Normal coronary calcium score of 0. Patient is low risk for coronary events.   2. Normal coronary origin with left dominance.   3. No evidence of CAD.   4. CAD-RADS 0. Consider non-atherosclerotic causes of chest pain.   TTE 06/02/21 1. Left ventricular ejection fraction, by estimation, is 55 to 60%. The  left ventricle has normal function. The left ventricle has no regional  wall motion abnormalities. Left ventricular diastolic parameters are  indeterminate. The average left  ventricular global longitudinal strain is -18.6 %. The global longitudinal  strain is normal.   2. Right ventricular systolic function is normal. The right ventricular  size is normal.   3. The mitral valve is abnormal. Trivial mitral valve regurgitation.  No  evidence of mitral stenosis.   4. The aortic valve is tricuspid. Aortic valve regurgitation is mild. No  aortic stenosis is present. Risk Assessment/Calculations:     HYPERTENSION CONTROL Vitals:   11/21/23 0929 11/21/23 0944  BP: (!) 164/70 (!) 152/78    The patient's blood pressure is elevated above target today.  In order to address the patient's elevated BP: Blood pressure will be monitored at home to determine if medication changes need to be made.       Physical Exam:   VS:  BP (!) 152/78 (BP Location: Left Arm, Patient Position: Sitting, Cuff Size: Large)   Pulse (!) 55   Ht 5\' 9"  (1.753 m)   Wt 284 lb (128.8 kg)   SpO2 98%   BMI 41.94 kg/m    Wt Readings from Last 3 Encounters:  11/21/23 284 lb (128.8 kg)  09/14/23 (!) 300 lb 3.2 oz (136.2 kg)  07/12/23 291 lb 9.6 oz (132.3 kg)    GEN: Well nourished, well developed in no acute distress NECK: No  JVD; No carotid bruits CARDIAC: RRR, no murmurs, rubs, gallops RESPIRATORY:  Clear to auscultation without rales, wheezing or rhonchi  ABDOMEN: Soft, non-tender, non-distended EXTREMITIES: Trace pretibial edema; No deformity, extensive bruising noted from below the knee to above the ankle and outer aspect of the left catheter from a previous mechanical fall  ASSESSMENT AND PLAN: .   Palpitations that she stated changed in intensity that is concerning.  She states that her heart beats seem to get hard in her chest to her back on exertion which is new for her.  She did previously want a ZIO monitor in August of last year that showed some SVT rare PACs and PVCs.  With her bradycardia and first-degree AV block we are unable to increase her beta-blocker.  She has been placed back on a ZIO XT monitor for 3 days to rule out any arrhythmia.  Chronic shortness of breath and dyspnea on exertion with coronary CTA revealing coronary calcium score of 0.  Echocardiogram revealed LVEF 60 to 65%, no RWMA, G1 DD, and mild aortic  regurgitation.  Patient states that her shortness of breath is stable and she has minimal edema noted today.  Primary hypertension with blood pressure 160/70 with repeat 152/78 patient states that she had transabdominal prior to her appointment this morning which caused elevated pressures.  She states she has been compliant with her current medication.  She is continued on amlodipine 5 daily, carvedilol 6.25 mg twice daily, HCTZ 25 mg daily, losartan 25 mg and Aldactone 25 mg daily.  She states her blood pressures at home running around 1 20-1 30.  She has been encouraged to continue to monitor pressures 1 to 2 hours postmedication administration as well.  Mixed hyperlipidemia with aortic atherosclerosis with a last LDL of 83.  Continues to have improved with compliance with Lipitor 20 mg daily.  This continues to be monitored by her PCP.  Obstructive sleep apnea which she states she continues to be compliant with nightly CPAP.  Type 2 diabetes with last hemoglobin A1c of 8.5.  She is continued on Ozempic.  And metformin.  This continues to be managed by her PCP.  Morbid obesity with a BMI of 41.94.  This complicates her prognosis.  She would greatly benefit from weight loss.       Dispo: Patient return to clinic to see MD/APP in 6 weeks or sooner if needed for reevaluation of symptoms and blood pressure  Signed, Hendrik Donath, NP

## 2023-11-21 ENCOUNTER — Ambulatory Visit: Payer: BC Managed Care – PPO | Attending: Cardiology | Admitting: Cardiology

## 2023-11-21 ENCOUNTER — Encounter: Payer: Self-pay | Admitting: Cardiology

## 2023-11-21 ENCOUNTER — Ambulatory Visit: Payer: BC Managed Care – PPO

## 2023-11-21 VITALS — BP 152/78 | HR 55 | Ht 69.0 in | Wt 284.0 lb

## 2023-11-21 DIAGNOSIS — R0602 Shortness of breath: Secondary | ICD-10-CM | POA: Diagnosis not present

## 2023-11-21 DIAGNOSIS — G4733 Obstructive sleep apnea (adult) (pediatric): Secondary | ICD-10-CM

## 2023-11-21 DIAGNOSIS — E782 Mixed hyperlipidemia: Secondary | ICD-10-CM | POA: Diagnosis not present

## 2023-11-21 DIAGNOSIS — R002 Palpitations: Secondary | ICD-10-CM | POA: Diagnosis not present

## 2023-11-21 DIAGNOSIS — E1165 Type 2 diabetes mellitus with hyperglycemia: Secondary | ICD-10-CM

## 2023-11-21 DIAGNOSIS — I10 Essential (primary) hypertension: Secondary | ICD-10-CM

## 2023-11-21 NOTE — Patient Instructions (Signed)
Medication Instructions:  No changes at this time.   *If you need a refill on your cardiac medications before your next appointment, please call your pharmacy*   Lab Work: None  If you have labs (blood work) drawn today and your tests are completely normal, you will receive your results only by: MyChart Message (if you have MyChart) OR A paper copy in the mail If you have any lab test that is abnormal or we need to change your treatment, we will call you to review the results.   Testing/Procedures: Your physician has recommended that you wear a Zio monitor for 3 days.   This monitor is a medical device that records the heart's electrical activity. Doctors most often use these monitors to diagnose arrhythmias. Arrhythmias are problems with the speed or rhythm of the heartbeat. The monitor is a small device applied to your chest. You can wear one while you do your normal daily activities. While wearing this monitor if you have any symptoms to push the button and record what you felt. Once you have worn this monitor for the period of time provider prescribed (Usually 14 days), you will return the monitor device in the postage paid box. Once it is returned they will download the data collected and provide [place] with a report which the provider will then review and we will call you with those results. Important tips:  Avoid showering during the first 24 hours of wearing the monitor. Avoid excessive sweating to help maximize wear time. Do not submerge the device, no hot tubs, and no swimming pools. Keep any lotions or oils away from the patch. After 24 hours you may shower with the patch on. Take brief showers with your back facing the shower head.  Do not remove patch once it has been placed because that will interrupt data and decrease adhesive wear time. Push the button when you have any symptoms and write down what you were feeling. Once you have completed wearing your monitor, remove and place  into box which has postage paid and place in your outgoing mailbox.  If for some reason you have misplaced your box then call our office and we can provide another box and/or mail it off for you.      Follow-Up: At [facility], you and your health needs are our priority.  As part of our continuing mission to provide you with exceptional heart care, we have created designated Provider Care Teams.  These Care Teams include your primary Cardiologist (physician) and Advanced Practice Providers (APPs -  Physician Assistants and Nurse Practitioners) who all work together to provide you with the care you need, when you need it.   Your next appointment:   6 week(s)  Provider:   Lorine Bears, MD or Charlsie Quest, NP

## 2023-11-25 DIAGNOSIS — R002 Palpitations: Secondary | ICD-10-CM | POA: Diagnosis not present

## 2023-12-05 DIAGNOSIS — R002 Palpitations: Secondary | ICD-10-CM | POA: Diagnosis not present

## 2023-12-15 ENCOUNTER — Ambulatory Visit: Payer: BC Managed Care – PPO | Admitting: Internal Medicine

## 2023-12-15 ENCOUNTER — Encounter: Payer: Self-pay | Admitting: Internal Medicine

## 2023-12-15 ENCOUNTER — Telehealth: Payer: Self-pay

## 2023-12-15 VITALS — BP 150/76 | Ht 69.0 in | Wt 280.6 lb

## 2023-12-15 DIAGNOSIS — E1165 Type 2 diabetes mellitus with hyperglycemia: Secondary | ICD-10-CM | POA: Diagnosis not present

## 2023-12-15 DIAGNOSIS — I700 Atherosclerosis of aorta: Secondary | ICD-10-CM

## 2023-12-15 DIAGNOSIS — M150 Primary generalized (osteo)arthritis: Secondary | ICD-10-CM

## 2023-12-15 DIAGNOSIS — I10 Essential (primary) hypertension: Secondary | ICD-10-CM

## 2023-12-15 DIAGNOSIS — N959 Unspecified menopausal and perimenopausal disorder: Secondary | ICD-10-CM

## 2023-12-15 DIAGNOSIS — K219 Gastro-esophageal reflux disease without esophagitis: Secondary | ICD-10-CM | POA: Insufficient documentation

## 2023-12-15 DIAGNOSIS — E785 Hyperlipidemia, unspecified: Secondary | ICD-10-CM

## 2023-12-15 DIAGNOSIS — E1169 Type 2 diabetes mellitus with other specified complication: Secondary | ICD-10-CM

## 2023-12-15 DIAGNOSIS — G4733 Obstructive sleep apnea (adult) (pediatric): Secondary | ICD-10-CM | POA: Diagnosis not present

## 2023-12-15 DIAGNOSIS — Z7984 Long term (current) use of oral hypoglycemic drugs: Secondary | ICD-10-CM

## 2023-12-15 DIAGNOSIS — E66813 Obesity, class 3: Secondary | ICD-10-CM

## 2023-12-15 NOTE — Assessment & Plan Note (Signed)
C-Met and lipid profile reviewed Encouraged her to consume a low-fat diet Continue atorvastatin and aspirin

## 2023-12-15 NOTE — Assessment & Plan Note (Signed)
A1c today urine microalbumin today has been checked within the last year Encouraged her to consume a low-carb diet and exercise weight loss Continue metformin and Ozempic, consider increasing Ozempic to 1 mg weekly pending labs Encouraged routine eye exam Encouraged routine foot exam Flu shot UTD Pneumovax UTD Encouraged her to get her COVID vaccines

## 2023-12-15 NOTE — Assessment & Plan Note (Signed)
Congratulated her on 20 pound weight loss Encouraged continued diet and exercise for weight loss

## 2023-12-15 NOTE — Assessment & Plan Note (Signed)
Encourage weight loss as this can help reduce joint pain She is currently following with orthopedics Continue ibuprofen as needed

## 2023-12-15 NOTE — Assessment & Plan Note (Signed)
C-Met and lipid profile reviewed Encouraged her to consume a low-fat diet Continue atorvastatin

## 2023-12-15 NOTE — Assessment & Plan Note (Signed)
Continue paroxetine We will monitor

## 2023-12-15 NOTE — Assessment & Plan Note (Signed)
Encourage weight loss as this can help reduce sleep apnea symptoms ?Encourage CPAP use ?

## 2023-12-15 NOTE — Telephone Encounter (Signed)
Copied from CRM [id number]. Topic: General - Other >> Dec 15, 2023  3:24 PM Ja-Kwan M wrote: Reason for CRM: Pt stated that she reviewed her lab results and saw where it is suggested to increase her medication. Pt stated that she is in agreement with increasing her medication. Pt requests call back to discuss

## 2023-12-15 NOTE — Progress Notes (Signed)
Subjective:    Patient ID: Shannon Bryan, female    DOB: Aug 11, 1961, 63 y.o.   MRN: [id number]  HPI  Patient presents to clinic today for follow-up of chronic conditions.  HTN: Her BP today is 158/70.  She is taking amlodipine, losartan, carvedilol, HCTZ, spironolactone and potassium as prescribed but admits that she has not taken her blood pressure medication this morning.  ECG from 11/2023 reviewed.   DM2 with neuropathy: Her last A1c was 8.5 %, 09/2023.  She is taking metformin and ozempic as prescribed..  Her sugars range 147-160.  She checks her feet routinely.  Her last eye exam was 03/2023.  Flu 09/2023.  Pneumovax 06/2023.  COVID never.   HLD with aortic atherosclerosis: Her last LDL was 83, triglycerides 578, 09/2023.  She denies myalgias on atorvastatin.  She is taking aspirin as well.  She does not consume a low-fat diet.  GERD: This occurs about 2-3 x week. She is not sure what triggers this. She is taking famotidine as needed with some relief of symptoms. There is no upper GI on file.  OSA: She averages 8 hours of sleep per night with the use of her CPAP.  Sleep study from 07/2021 reviewed.   OA: Mainly in her back and knees.  She takes ibuprofen as needed with good relief of symptoms.  She follows with orthopedics.  Postmenopausal symptoms: She reports mainly irritability.  Managed on paroxetine which she reports has helped significantly.  She is not currently seeing a therapist.  She denies SI/HI.  Review of Systems     Past Medical History:  Diagnosis Date   Asthma    Blood clot in vein    Chronic kidney disease    Diabetes mellitus without complication (HCC)    Hyperlipidemia    Hypertension    Kidney stone    Sleep apnea     Current Outpatient Medications  Medication Sig Dispense Refill   acetaminophen (TYLENOL) 500 MG tablet Take 500 mg by mouth every 6 (six) hours as needed.     albuterol (VENTOLIN HFA) 108 (90 Base) MCG/ACT inhaler Inhale 1-2 puffs into  the lungs every 4 (four) hours as needed for wheezing or shortness of breath. 18 g 2   amLODipine (NORVASC) 5 MG tablet Take 1 tablet (5 mg total) by mouth daily. 90 tablet 0   aspirin EC 81 MG tablet Take 81 mg by mouth daily.     atorvastatin (LIPITOR) 20 MG tablet Take 1 tablet (20 mg total) by mouth daily. 90 tablet 1   Blood Glucose Monitoring Suppl (ONETOUCH VERIO FLEX SYSTEM) w/Device KIT Use to check blood sugar up to twice daily as directed 1 kit 0   carvedilol (COREG) 6.25 MG tablet Take 1 tablet (6.25 mg total) by mouth 2 (two) times daily. 180 tablet 3   cetirizine (ZYRTEC) 10 MG tablet Take 10 mg by mouth daily.     glucose blood (ONETOUCH VERIO) test strip Use to check blood sugar up to twice daily 200 each 3   hydrochlorothiazide (HYDRODIURIL) 25 MG tablet Take 1 tablet (25 mg total) by mouth daily. 90 tablet 3   ibuprofen (ADVIL) 800 MG tablet Take 1 tablet by mouth three times daily as needed 90 tablet 0   Lancets (ONETOUCH DELICA PLUS LANCET30G) MISC Use to check blood sugar up to twice daily 200 each 3   losartan (COZAAR) 25 MG tablet Take 1 tablet (25 mg total) by mouth daily. 90 tablet 3  metFORMIN (GLUCOPHAGE) 1000 MG tablet Take 1 tablet (1,000 mg total) by mouth 2 (two) times daily with a meal. 180 tablet 1   methocarbamol (ROBAXIN) 500 MG tablet Take 1 tablet (500 mg total) by mouth 4 (four) times daily. 30 tablet 3   Multiple Vitamins-Minerals (ONE-A-DAY 50 PLUS PO) Take 1 tablet by mouth daily.     nystatin cream (MYCOSTATIN) Apply 1 Application topically 3 (three) times daily. 30 g 0   OZEMPIC, 0.25 OR 0.5 MG/DOSE, 2 MG/3ML SOPN Inject 0.25 mg into the skin daily.     PARoxetine (PAXIL) 20 MG tablet Take 1 tablet (20 mg total) by mouth every morning. 90 tablet 0   potassium chloride SA (KLOR-CON M20) 20 MEQ tablet Take 1 tablet (20 mEq total) by mouth daily. 90 tablet 3   spironolactone (ALDACTONE) 25 MG tablet Take 1 tablet (25 mg total) by mouth daily. 90 tablet 0    No current facility-administered medications for this visit.    Allergies  Allergen Reactions   Bactrim [Sulfamethoxazole-Trimethoprim] Hives   Penicillins Swelling and Other (See Comments)   Sulfa Antibiotics Other (See Comments)   Cleocin [Clindamycin Hcl] Rash    Family History  Problem Relation Age of Onset   Heart disease Mother    Stroke Mother    Diabetes Mother    Cancer Mother        bone   Depression Mother    Diabetes Father    Emphysema Father    Breast cancer Neg Hx     Social History   Socioeconomic History   Marital status: Single    Spouse name: Not on file   Number of children: Not on file   Years of education: Not on file   Highest education level: Not on file  Occupational History   Not on file  Tobacco Use   Smoking status: Some Days    Current packs/day: 0.00    Average packs/day: 0.3 packs/day for 30.0 years (7.5 ttl pk-yrs)    Types: E-cigarettes, Cigarettes    Start date: 02/02/1990    Last attempt to quit: 02/03/2020    Years since quitting: 3.8   Smokeless tobacco: Former   Tobacco comments:    1/4 - 1/2 ppd had quit for 1 year in 2010  Vaping Use   Vaping status: Some Days   Devices: Vaping  Substance and Sexual Activity   Alcohol use: Yes    Alcohol/week: 1.0 standard drink of alcohol    Types: 1 Glasses of wine per week    Comment: occasional   Drug use: No   Sexual activity: Never  Other Topics Concern   Not on file  Social History Narrative   Not on file   Social Drivers of Health   Financial Resource Strain: Not on file  Food Insecurity: Not on file  Transportation Needs: Not on file  Physical Activity: Not on file  Stress: Not on file  Social Connections: Not on file  Intimate Partner Violence: Not on file     Constitutional: Denies fever, malaise, fatigue, headache or abrupt weight changes.  HEENT: Denies eye pain, eye redness, ear pain, ringing in the ears, wax buildup, runny nose, nasal congestion, bloody  nose, or sore throat. Respiratory: Denies difficulty breathing, shortness of breath, cough or sputum production.   Cardiovascular: Denies chest pain, chest tightness, palpitations or swelling in the hands or feet.  Gastrointestinal: Pt reports reflux. Denies abdominal pain, bloating, constipation, diarrhea or blood in the stool.  GU: Denies urgency, frequency, pain with urination, burning sensation, blood in urine, odor or discharge. Musculoskeletal: Patient reports joint pain.  Denies decrease in range of motion, difficulty with gait, muscle pain or joint swelling.  Skin: Denies redness, rashes, lesions or ulcercations.  Neurological: Denies dizziness, difficulty with memory, difficulty with speech or problems with balance and coordination.  Psych: Pt reports irritability. Denies anxiety, depression, SI/HI.  No other specific complaints in a complete review of systems (except as listed in HPI above).  Objective:   Physical Exam  BP (!) 158/70 (BP Location: Left Arm, Patient Position: Sitting, Cuff Size: Large)   Ht 5\' 9"  (1.753 m)   Wt 280 lb 9.6 oz (127.3 kg)   BMI 41.44 kg/m   Wt Readings from Last 3 Encounters:  11/21/23 284 lb (128.8 kg)  09/14/23 (!) 300 lb 3.2 oz (136.2 kg)  07/12/23 291 lb 9.6 oz (132.3 kg)    General: Appears her stated age, obese, in NAD. Skin: Warm, dry and intact. No ulcerations noted. HEENT: Head: normal shape and size; Eyes: sclera white, no icterus, conjunctiva pink, PERRLA and EOMs intact;  Cardiovascular: Normal rate and rhythm. S1,S2 noted.  No murmur, rubs or gallops noted. No JVD. No BLE edema. No carotid bruits noted. Pulmonary/Chest: Normal effort and positive vesicular breath sounds. No respiratory distress. No wheezes, rales or ronchi noted.  Abdomen: Soft and nontender. Normal bowel sounds.  Musculoskeletal: Gait slow and steady without device. Neurological: Alert and oriented. Coordination normal.  Psychiatric: Mood and affect normal.  Behavior is normal. Judgment and thought content normal.     BMET    Component Value Date/Time   NA 139 09/14/2023 0853   NA 141 06/11/2021 0927   K 4.0 09/14/2023 0853   CL 103 09/14/2023 0853   CO2 26 09/14/2023 0853   GLUCOSE 187 (H) 09/14/2023 0853   BUN 12 09/14/2023 0853   BUN 12 06/11/2021 0927   CREATININE 0.82 09/14/2023 0853   CALCIUM 9.2 09/14/2023 0853   GFRNONAA >60 03/31/2023 1049   GFRNONAA 70 01/05/2019 1101   GFRAA >60 06/20/2020 1936   GFRAA 81 01/05/2019 1101    Lipid Panel     Component Value Date/Time   CHOL 146 09/14/2023 0853   TRIG 104 09/14/2023 0853   HDL 43 (L) 09/14/2023 0853   CHOLHDL 3.4 09/14/2023 0853   VLDL 20 03/29/2017 0833   LDLCALC 83 09/14/2023 0853    CBC    Component Value Date/Time   WBC 10.6 09/14/2023 0853   RBC 4.69 09/14/2023 0853   HGB 12.8 09/14/2023 0853   HCT 39.5 09/14/2023 0853   PLT 298 09/14/2023 0853   MCV 84.2 09/14/2023 0853   MCH 27.3 09/14/2023 0853   MCHC 32.4 09/14/2023 0853   RDW 15.4 (H) 09/14/2023 0853   LYMPHSABS 2.8 01/18/2023 0814   MONOABS 0.7 01/18/2023 0814   EOSABS 0.1 01/18/2023 0814   BASOSABS 0.0 01/18/2023 0814    Hgb A1C Lab Results  Component Value Date   HGBA1C 8.5 (H) 09/14/2023          Assessment & Plan:     RTC in 6 months, follow-up chronic conditions Nicki Reaper, NP

## 2023-12-15 NOTE — Patient Instructions (Signed)

## 2023-12-15 NOTE — Assessment & Plan Note (Signed)
Elevated today but she admits she has not taken her blood pressure medication Continue amlodipine, losartan, carvedilol, HCTZ, spironolactone and potassium Reinforced DASH diet and exercise for weight loss C-Met today

## 2023-12-15 NOTE — Assessment & Plan Note (Signed)
Encourage weight loss as this can help reduce reflux symptoms Try to identify and avoid foods that trigger reflux Continue famotidine as needed

## 2023-12-16 ENCOUNTER — Encounter: Payer: Self-pay | Admitting: Internal Medicine

## 2023-12-16 LAB — HEMOGLOBIN A1C
Hgb A1c MFr Bld: 7.4 %{Hb} — ABNORMAL HIGH (ref <5.7)
Mean Plasma Glucose: 166 mg/dL
eAG (mmol/L): 9.2 mmol/L

## 2023-12-16 LAB — COMPLETE METABOLIC PANEL WITH GFR
AG Ratio: 1.7 (calc) (ref 1.0–2.5)
ALT: 19 U/L (ref 6–29)
AST: 18 U/L (ref 10–35)
Albumin: 4.3 g/dL (ref 3.6–5.1)
Alkaline phosphatase (APISO): 65 U/L (ref 37–153)
BUN: 10 mg/dL (ref 7–25)
CO2: 25 mmol/L (ref 20–32)
Calcium: 9.4 mg/dL (ref 8.6–10.4)
Chloride: 103 mmol/L (ref 98–110)
Creat: 0.79 mg/dL (ref 0.50–1.05)
Globulin: 2.6 g/dL (ref 1.9–3.7)
Glucose, Bld: 130 mg/dL — ABNORMAL HIGH (ref 65–99)
Potassium: 3.6 mmol/L (ref 3.5–5.3)
Sodium: 140 mmol/L (ref 135–146)
Total Bilirubin: 0.4 mg/dL (ref 0.2–1.2)
Total Protein: 6.9 g/dL (ref 6.1–8.1)
eGFR: 86 mL/min/{1.73_m2} (ref >=60)

## 2023-12-16 NOTE — Telephone Encounter (Signed)
I am discussing this with her currently through a result note on her labs.

## 2024-01-05 ENCOUNTER — Ambulatory Visit: Payer: BC Managed Care – PPO | Admitting: Cardiology

## 2024-01-09 ENCOUNTER — Telehealth: Payer: Self-pay | Admitting: Pharmacist

## 2024-01-09 ENCOUNTER — Other Ambulatory Visit: Payer: Self-pay

## 2024-01-09 NOTE — Telephone Encounter (Signed)
   Outreach Note  01/09/2024 Name: Shannon Bryan MRN: [id number] DOB: 11-22-1960  Referred by: Lorre Munroe, NP  Was unable to reach patient via telephone today and have left HIPAA compliant voicemail asking patient to return my call.   Follow Up Plan: Will collaborate with Care Guide to outreach to schedule follow up with me  Estelle Grumbles, PharmD, [facility] [phone]

## 2024-01-11 NOTE — Telephone Encounter (Signed)
 Receive a message from office advising that patient returned my call from 3/10.  Was unable to reach patient via telephone today and have left HIPAA compliant voicemail asking patient to return my call.   Follow Up Plan: Will collaborate with Care Guide to outreach to schedule follow up with me   Estelle Grumbles, PharmD, [facility] [phone]

## 2024-01-19 ENCOUNTER — Telehealth: Payer: Self-pay

## 2024-01-19 NOTE — Progress Notes (Signed)
 Complex Care Management Care Guide Note  01/19/2024 Name: Shannon Bryan MRN: [id number] DOB: July 19, 1961  Shannon Bryan is a 63 y.o. year old female who is a primary care patient of Lorre Munroe, NP and is actively engaged with the care management team. I reached out to [name] by phone today to assist with re-scheduling  with the Pharmacist.  Follow up plan: Unsuccessful telephone outreach attempt made. A HIPAA compliant phone message was left for the patient providing contact information and requesting a return call.  Penne Lash , RMA     [facility]  [facility], [facility] Guide  Direct Dial: [phone]  Website: [email or web address]

## 2024-02-01 ENCOUNTER — Other Ambulatory Visit: Payer: Self-pay | Admitting: Internal Medicine

## 2024-02-03 NOTE — Telephone Encounter (Signed)
 Requested Prescriptions  Pending Prescriptions Disp Refills   PARoxetine (PAXIL) 20 MG tablet [Pharmacy Med Name: PARoxetine HCl 20 MG Oral Tablet] 90 tablet 0    Sig: TAKE 1 TABLET BY MOUTH ONCE DAILY IN THE MORNING     Psychiatry:  Antidepressants - SSRI Passed - 02/03/2024  8:44 AM      Passed - Valid encounter within last 6 months    Recent Outpatient Visits           1 month ago Type 2 diabetes mellitus with hyperglycemia, without long-term current use of insulin [facility])   [facility] [place], Salvadore Oxford, NP       Future Appointments             In 4 months Baity, Salvadore Oxford, NP [facility], [facility]

## 2024-02-13 ENCOUNTER — Other Ambulatory Visit: Admitting: Pharmacist

## 2024-02-13 ENCOUNTER — Ambulatory Visit: Payer: Self-pay | Admitting: *Deleted

## 2024-02-13 DIAGNOSIS — Z7985 Long-term (current) use of injectable non-insulin antidiabetic drugs: Secondary | ICD-10-CM

## 2024-02-13 DIAGNOSIS — E1165 Type 2 diabetes mellitus with hyperglycemia: Secondary | ICD-10-CM

## 2024-02-13 NOTE — Telephone Encounter (Signed)
 Copied from CRM [id number]. Topic: Clinical - Red Word Triage >> Feb 13, 2024  3:24 PM Baldemar Lev wrote: Red Word that prompted transfer to Nurse Triage: Blood in mucus, vertigo, ear pain. Reason for Disposition  Earache  (Exceptions: brief ear pain of < 60 minutes duration, earache occurring during air travel  Answer Assessment - Initial Assessment Questions 1. LOCATION: "Which ear is involved?"     I'm having blood in my mucus.   I had to pull over because I'm dizzy.   I have thick mucus in my throat and chest.  It's been bloody mucus. My right ear is hurting.   No drainage.   It's pressure.   I'm using Flonase when I can get it in.    The inside of my nose is so swollen and sore.   2. ONSET: "When did the ear start hurting"      The vertigo is worse.   Every year I get like this.  I have a cough now.   Every time I blow my nose I have clumps of thick bloody mucus out.   I was able to blow the left side of my nose. 3. SEVERITY: "How bad is the pain?"  (Scale 1-10; mild, moderate or severe)   - MILD (1-3): doesn't interfere with normal activities    - MODERATE (4-7): interferes with normal activities or awakens from sleep    - SEVERE (8-10): excruciating pain, unable to do any normal activities      Moderate It's been going on 2-3 weeks. 4. URI SYMPTOMS: "Do you have a runny nose or cough?"     Yes runny nose and stopped up and I have a cough. 5. FEVER: "Do you have a fever?" If Yes, ask: "What is your temperature, how was it measured, and when did it start?"     I had fever last week.    I'm taking Tylenol every night. 6. CAUSE: "Have you been swimming recently?", "How often do you use Q-TIPS?", "Have you had any recent air travel or scuba diving?"     Not asked 7. OTHER SYMPTOMS: "Do you have any other symptoms?" (e.g., headache, stiff neck, dizziness, vomiting, runny nose, decreased hearing)     Vertigo.  I don't have medicine for the vertigo. 8. PREGNANCY: "Is there any chance you are  pregnant?" "When was your last menstrual period?"     Not asked  Protocols used: Earache-A-AH  Chief Complaint: bloody nasal mucus, cough, dizziness from right ear pressure, sore throat Symptoms: above along with nausea Frequency: For the last 2-3 weeks Pertinent Negatives: Patient denies fever now. Disposition: [] ED /[] Urgent Care (no appt availability in office) / [x] Appointment(In office/virtual)/ []  [facility]/ [] Home Care/ [] Refused Recommended Disposition /[] [place] [facility]/ []  Follow-up with PCP Additional Notes: Appt made for 02/14/2024.

## 2024-02-13 NOTE — Patient Instructions (Signed)
 Goals Addressed             This Visit's Progress    Pharmacy Goals       The goal A1c is less than 7%. This is the best way to reduce the risk of the long term complications of diabetes, including heart disease, kidney disease, eye disease, strokes, and nerve damage. An A1c of less than 7% corresponds with fasting sugars less than 130 and 2 hour after meal sugars less than 180. Please check your blood sugar daily.   Our goal bad cholesterol, or LDL, is less than 70 . This is why it is important to continue taking your atorvastatin.  Check your blood pressure twice weekly, and any time you have concerning symptoms like headache, chest pain, dizziness, shortness of breath, or vision changes.   Our goal is less than 130/80.  To appropriately check your blood pressure, make sure you do the following:  1) Avoid caffeine, exercise, or tobacco products for 30 minutes before checking. Empty your bladder. 2) Sit with your back supported in a flat-backed chair. Rest your arm on something flat (arm of the chair, table, etc). 3) Sit still with your feet flat on the floor, resting, for at least 5 minutes.  4) Check your blood pressure. Take 1-2 readings.  5) Write down these readings and bring with you to any provider appointments.  Bring your home blood pressure machine with you to a provider's office for accuracy comparison at least once a year.   Make sure you take your blood pressure medications before you come to any office visit, even if you were asked to fast for labs.  Arthur Lash, PharmD, [facility] Clinical Pharmacist [facility] [phone]

## 2024-02-13 NOTE — Progress Notes (Signed)
 02/13/2024 Name: Shannon Bryan MRN: [id number] DOB: 1961-06-27  Chief Complaint  Patient presents with   Medication Management   Medication Adherence    Shannon Bryan is a 63 y.o. year old female who presented for a telephone visit.   Patient appearing on report for [employer] - Diabetes Control report due to last documented A1C of 8.5% on 09/14/2023. Next appointment with PCP is 12/15/2023    Outreached patient to discuss diabetes control and medication management.    Subjective:  Care Team: Primary Care Provider: Lorre Munroe, NP ; Next Scheduled Visit: 06/12/2024   Medication Access/Adherence  Current Pharmacy:  [facility] [address]), [place] - [address] [place]) [place] [address] Phone: [phone] Fax: [phone]   Patient reports affordability concerns with their medications: No  Patient reports access/transportation concerns to their pharmacy: No  Patient reports adherence concerns with their medications:  No     Denies using weekly pillbox to organize her medications; denies missed doses   Reports has had cough/congestion for weeks and now feels like this is impacting her balance. 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 90 tablet 0   metFORMIN (GLUCOPHAGE) 1000 MG tablet Take 1 tablet (1,000 mg total) by mouth 2 (two) times daily with a meal. 180 tablet 1   OZEMPIC, 0.25 OR 0.5 MG/DOSE, 2 MG/3ML SOPN Inject 0.5 mg into the skin daily.     acetaminophen (TYLENOL) 500 MG tablet Take 500 mg by mouth every 6 (six) hours as needed.     albuterol (VENTOLIN HFA) 108 (90 Base) MCG/ACT inhaler Inhale 1-2 puffs into the lungs every 4 (four) hours as needed for wheezing or shortness of breath. 18 g 2   aspirin EC 81 MG tablet Take 81 mg by mouth daily.     atorvastatin (LIPITOR) 20 MG tablet Take 1 tablet (20 mg total) by mouth daily. 90 tablet 1   Blood Glucose Monitoring Suppl (ONETOUCH VERIO FLEX SYSTEM) w/Device KIT Use to check blood sugar up to twice daily as directed 1 kit 0   carvedilol (COREG) 6.25 MG tablet Take 1 tablet (6.25 mg total) by mouth 2 (two) times daily. 180 tablet  3   cetirizine (ZYRTEC) 10 MG tablet Take 10 mg by mouth daily.     glucose blood (ONETOUCH VERIO) test strip Use to check blood sugar up to twice daily 200 each 3   hydrochlorothiazide (HYDRODIURIL) 25 MG tablet Take 1 tablet (25 mg total) by mouth daily. 90 tablet 3   ibuprofen (ADVIL) 800 MG tablet Take 1 tablet by mouth three times daily as needed 90 tablet 0   Lancets (ONETOUCH DELICA PLUS LANCET30G) MISC Use to check blood sugar up to twice daily 200 each 3   losartan (COZAAR) 25 MG tablet Take 1 tablet (25 mg total) by mouth daily. 90 tablet 3   methocarbamol  (ROBAXIN) 500 MG tablet Take 1 tablet (500 mg total) by mouth 4 (four) times daily. 30 tablet 3   Multiple Vitamins-Minerals (ONE-A-DAY 50 PLUS PO) Take 1 tablet by mouth daily.     nystatin cream (MYCOSTATIN) Apply 1 Application topically 3 (three) times daily. 30 g 0   PARoxetine (PAXIL) 20 MG tablet TAKE 1 TABLET BY MOUTH ONCE DAILY IN THE MORNING 90 tablet 0   potassium chloride SA (KLOR-CON M20) 20 MEQ tablet Take 1 tablet (20 mEq total) by mouth daily. 90 tablet 3   spironolactone (ALDACTONE) 25 MG tablet Take 1 tablet (25 mg total) by mouth daily. 90 tablet 0   No current facility-administered medications on file prior to visit.        Assessment/Plan:   Advise patient to contact PCP office today to request an appointment regarding her ongoing cough and congestion - Send message to collaborate with Dr. Romeo Co (currently covering for PCP)  Discuss importance of medication adherence.   Diabetes: - Have reviewed long term cardiovascular and renal outcomes of uncontrolled blood sugar - Have reviewed goal A1c, goal fasting, and goal 2 hour post prandial glucose - Reviewed dietary modifications including importance of having regular well-balanced meals and snacks throughout the day, while controlling carbohydrate portion sizes - Recommend to check glucose, keep log of results and have this record to review at upcoming medical appointments. Patient to contact provider office sooner if needed for readings outside of established parameters or symptoms     Follow Up Plan: Clinical Pharmacist will follow up with patient by telephone on 03/19/2024 at 3:00 PM    Arthur Lash, PharmD, [facility] Clinical Pharmacist [facility] [phone]

## 2024-02-14 ENCOUNTER — Encounter: Payer: Self-pay | Admitting: Family Medicine

## 2024-02-14 ENCOUNTER — Ambulatory Visit: Admitting: Family Medicine

## 2024-02-14 VITALS — BP 132/76 | HR 76 | Ht 69.0 in | Wt 264.2 lb

## 2024-02-14 DIAGNOSIS — H6993 Unspecified Eustachian tube disorder, bilateral: Secondary | ICD-10-CM

## 2024-02-14 DIAGNOSIS — H8110 Benign paroxysmal vertigo, unspecified ear: Secondary | ICD-10-CM

## 2024-02-14 DIAGNOSIS — J0110 Acute frontal sinusitis, unspecified: Secondary | ICD-10-CM | POA: Diagnosis not present

## 2024-02-14 MED ORDER — AZITHROMYCIN 250 MG PO TABS: ORAL_TABLET | ORAL | 0 refills | Status: DC

## 2024-02-14 MED ORDER — MECLIZINE HCL 25 MG PO TABS
25.0000 mg | ORAL_TABLET | Freq: Three times a day (TID) | ORAL | 0 refills | Status: DC | PRN
Start: 2024-02-14 — End: 2024-06-12

## 2024-02-14 MED ORDER — BENZONATATE 100 MG PO CAPS
100.0000 mg | ORAL_CAPSULE | Freq: Three times a day (TID) | ORAL | 0 refills | Status: DC | PRN
Start: 2024-02-14 — End: 2024-06-12

## 2024-02-14 MED ORDER — PREDNISONE 10 MG PO TABS
ORAL_TABLET | ORAL | 0 refills | Status: DC
Start: 2024-02-14 — End: 2024-06-12

## 2024-02-14 NOTE — Progress Notes (Signed)
 Subjective:    Patient ID: Shannon Bryan, female    DOB: 1961-10-09, 63 y.o.   MRN: [id number]  Shannon Bryan is a 63 y.o. female presenting on 02/14/2024 for Sinusitis  Patient presents for a same day appointment.  PCP Nicki Reaper, FNP   HPI  Discussed the use of AI scribe software for clinical note transcription with the patient, who gave verbal consent to proceed.  History of Present Illness   Shannon Bryan is a 63 year old female who presents with worsening allergy symptoms and vertigo.  She experiences itching and pain in her right ear, along with throat soreness. These symptoms began with the onset of spring and have worsened significantly since the eighth of last week. She describes the nasal discharge as green and occasionally mixed with blood, which she expels by blowing her nose and coughing. She has a history of similar symptoms occurring annually, with the last treatment for a sinus infection or bronchitis occurring last year. She has been using Flonase, saline nasal spray, and Zyrtec (two doses daily) without relief. She also tried Careers adviser  She experiences vertigo, describing episodes of room spinning and nausea, which have led to falls. The vertigo has worsened, causing her to feel nauseated and requiring her to hold onto objects to prevent falling if having acute flare. Brief episodes only.       02/14/2024    3:11 PM 12/15/2023    8:06 AM 09/14/2023    8:35 AM  Depression screen PHQ 2/9  Decreased Interest 1 1 2   Down, Depressed, Hopeless 1 1 2   PHQ - 2 Score 2 2 4   Altered sleeping 1 3 3   Tired, decreased energy 0 2 3  Change in appetite 0 2 1  Feeling bad or failure about yourself  0 0 0  Trouble concentrating 0 0 1  Moving slowly or fidgety/restless 0 0 0  Suicidal thoughts 0 0 0  PHQ-9 Score 3 9 12   Difficult doing work/chores  Somewhat difficult        02/14/2024    3:12 PM 12/15/2023    8:06 AM 09/14/2023    8:35 AM 01/04/2019    1:38 PM  GAD  7 : Generalized Anxiety Score  Nervous, Anxious, on Edge 1 0 1 1  Control/stop worrying 3 1 0 3  Worry too much - different things 2 1 3 3   Trouble relaxing 0 0 3 3  Restless 1 1 2 1   Easily annoyed or irritable 3 1 3 3   Afraid - awful might happen 1 0 1 3  Total GAD 7 Score 11 4 13 17   Anxiety Difficulty  Not difficult at all  Somewhat difficult    Social History   Tobacco Use   Smoking status: Some Days    Current packs/day: 0.00    Average packs/day: 0.3 packs/day for 30.0 years (7.5 ttl pk-yrs)    Types: E-cigarettes, Cigarettes    Start date: 02/02/1990    Last attempt to quit: 02/03/2020    Years since quitting: 4.0   Smokeless tobacco: Former   Tobacco comments:    1/4 - 1/2 ppd had quit for 1 year in 2010  Vaping Use   Vaping status: Some Days   Devices: Vaping  Substance Use Topics   Alcohol use: Yes    Alcohol/week: 1.0 standard drink of alcohol    Types: 1 Glasses of wine per week    Comment: occasional   Drug use: No    Review of  Systems Per HPI unless specifically indicated above     Objective:    BP 132/76 (BP Location: Right Arm, Patient Position: Sitting, Cuff Size: Large)   Pulse 76   Ht 5\' 9"  (1.753 m)   Wt 264 lb 4 oz (119.9 kg)   SpO2 100%   BMI 39.02 kg/m   Wt Readings from Last 3 Encounters:  02/14/24 264 lb 4 oz (119.9 kg)  12/15/23 280 lb 9.6 oz (127.3 kg)  11/21/23 284 lb (128.8 kg)    Physical Exam Vitals and nursing note reviewed.  Constitutional:      General: She is not in acute distress.    Appearance: Normal appearance. She is well-developed. She is not diaphoretic.     Comments: Well-appearing, comfortable, cooperative  HENT:     Head: Normocephalic and atraumatic.     Right Ear: Ear canal and external ear normal. There is no impacted cerumen.     Left Ear: Ear canal and external ear normal. There is no impacted cerumen.     Ears:     Comments: TMs with effusion without erythema    Mouth/Throat:     Pharynx: No  oropharyngeal exudate or posterior oropharyngeal erythema.  Eyes:     General:        Right eye: No discharge.        Left eye: No discharge.     Conjunctiva/sclera: Conjunctivae normal.  Cardiovascular:     Rate and Rhythm: Normal rate.  Pulmonary:     Effort: Pulmonary effort is normal.  Skin:    General: Skin is warm and dry.     Findings: No erythema or rash.  Neurological:     Mental Status: She is alert and oriented to person, place, and time.  Psychiatric:        Mood and Affect: Mood normal.        Behavior: Behavior normal.        Thought Content: Thought content normal.     Comments: Well groomed, good eye contact, normal speech and thoughts     Results for orders placed or performed in visit on 12/15/23  COMPLETE METABOLIC PANEL WITH GFR   Collection Time: 12/15/23  8:27 AM  Result Value Ref Range   Glucose, Bld 130 (H) 65 - 99 mg/dL   BUN 10 7 - 25 mg/dL   Creat 2.95 6.21 - 3.08 mg/dL   eGFR 86 > OR = 60 MV/HQI/6.96E9   BUN/Creatinine Ratio SEE NOTE: 6 - 22 (calc)   Sodium 140 135 - 146 mmol/L   Potassium 3.6 3.5 - 5.3 mmol/L   Chloride 103 98 - 110 mmol/L   CO2 25 20 - 32 mmol/L   Calcium 9.4 8.6 - 10.4 mg/dL   Total Protein 6.9 6.1 - 8.1 g/dL   Albumin 4.3 3.6 - 5.1 g/dL   Globulin 2.6 1.9 - 3.7 g/dL (calc)   AG Ratio 1.7 1.0 - 2.5 (calc)   Total Bilirubin 0.4 0.2 - 1.2 mg/dL   Alkaline phosphatase (APISO) 65 37 - 153 U/L   AST 18 10 - 35 U/L   ALT 19 6 - 29 U/L  Hemoglobin A1c   Collection Time: 12/15/23  8:27 AM  Result Value Ref Range   Hgb A1c MFr Bld 7.4 (H) <5.7 % of total Hgb   Mean Plasma Glucose 166 mg/dL   eAG (mmol/L) 9.2 mmol/L      Assessment & Plan:   Problem List Items Addressed This Visit  None Visit Diagnoses       Acute non-recurrent frontal sinusitis    -  Primary   Relevant Medications   azithromycin (ZITHROMAX Z-PAK) 250 MG tablet   benzonatate (TESSALON) 100 MG capsule   predniSONE (DELTASONE) 10 MG tablet     Benign  paroxysmal positional vertigo, unspecified laterality       Relevant Medications   meclizine (ANTIVERT) 25 MG tablet     Eustachian tube dysfunction, bilateral       Relevant Medications   predniSONE (DELTASONE) 10 MG tablet        Allergic Rhinitis with Sinusitis Symptoms indicate allergic rhinitis and sinusitis with possible deeper sinus infection. Annual sinus infections and symptom exacerbation support this. Treatment targets sinus pressure and fluid to reduce pain and vertigo.  - Prescribe Z-Pak (azithromycin) for sinus infection. (PCN allergy) - Advise continued use of Flonase and saline nasal sprays. - Provide Tessalon Perles for cough. - Discuss decongestant use for pressure relief. - Continue allergy therapy  If no improvement, back up plan - Prescribe a 6-day steroid taper. Caution hyperglycemia  Vertigo Vertigo likely due to inner ear issues from sinus swelling and fluid. Treating sinus condition should alleviate vertigo. - Provide instructions for home vertigo exercises. - Prescribe meclizine for dizziness if needed.        No orders of the defined types were placed in this encounter.   Meds ordered this encounter  Medications   azithromycin (ZITHROMAX Z-PAK) 250 MG tablet    Sig: Take 2 tabs (500mg  total) on Day 1. Take 1 tab (250mg ) daily for next 4 days.    Dispense:  6 tablet    Refill:  0   benzonatate (TESSALON) 100 MG capsule    Sig: Take 1 capsule (100 mg total) by mouth 3 (three) times daily as needed for cough.    Dispense:  30 capsule    Refill:  0   predniSONE (DELTASONE) 10 MG tablet    Sig: Take 6 tabs with breakfast Day 1, 5 tabs Day 2, 4 tabs Day 3, 3 tabs Day 4, 2 tabs Day 5, 1 tab Day 6.    Dispense:  21 tablet    Refill:  0   meclizine (ANTIVERT) 25 MG tablet    Sig: Take 1 tablet (25 mg total) by mouth 3 (three) times daily as needed for dizziness.    Dispense:  30 tablet    Refill:  0    Follow up plan: Return if symptoms worsen  or fail to improve.    Domingo Friend, DO [facility] 02/14/2024, 3:28 PM

## 2024-02-14 NOTE — Patient Instructions (Addendum)
 Thank you for coming to the office today.  1. It sounds like you have a Sinusitis (Bacterial Infection) - this most likely started as an Upper Respiratory Virus that has settled into an infection. Allergies can also cause this.  Start Azithromycin Z pak (antibiotic) 2 tabs day 1, then 1 tab x 4 days, complete entire course even if improved  Keep on allergy pill  Continue Flonase  - Congestion draining down throat can cause irritation. May try warm herbal tea with honey, cough drops - Can take Tylenol or Ibuprofen as needed for fevers - May continue over the counter cold medicine as you are, I would not use any decongestant or mucinex longer than 7 days.  If you develop persistent fever >101F for at least 3 consecutive days, headaches with sinus pain or pressure or persistent earache, please schedule a follow-up evaluation within next few days to week.   1. You have symptoms of Vertigo (Benign Paroxysmal Positional Vertigo) - This is commonly caused by inner ear fluid imbalance, sometimes can be worsened by allergies and sinus symptoms, otherwise it can occur randomly sometimes and we may never discover the exact cause. - You may take Meclizine as needed up to 3 times a day for dizziness, this will not cure symptoms but may help. Caution may make you drowsy.  If you develop significant worsening episode with vertigo that does not improve and you get severe headache, loss of vision, arm or leg weakness, slurred speech, or other concerning symptoms please seek immediate medical attention at Emergency Department.  Please schedule a follow-up appointment with Dr Romeo Co within 4 weeks if Vertigo not improving, and will consider Referral to Vestibular Rehab  See the next page for images describing the [place] Man[place].     ----------------------------------------------------------------------------------------------------------------------       Please schedule a Follow-up  Appointment to: Return if symptoms worsen or fail to improve.  If you have any other questions or concerns, please feel free to call the office or send a message through MyChart. You may also schedule an earlier appointment if necessary.  Additionally, you may be receiving a survey about your experience at our office within a few days to 1 week by e-mail or mail. We value your feedback.  Domingo Friend, DO [address], [place]

## 2024-02-24 ENCOUNTER — Other Ambulatory Visit: Payer: Self-pay | Admitting: Internal Medicine

## 2024-02-24 NOTE — Telephone Encounter (Signed)
 Requested medication (s) are due for refill today:   Not sure  Requested medication (s) are on the active medication list:   Yes as historical  Future visit scheduled:   Yes. 8/12   LOV 12/15/2023   Last ordered: 11/10/2023 as historical by the pharmacist  Unable to refill because last prescribed by pharmacist and listed as historical on med list now   Requested Prescriptions  Pending Prescriptions Disp Refills   OZEMPIC , 0.25 OR 0.5 MG/DOSE, 2 MG/3ML SOPN [Pharmacy Med Name: Ozempic  (0.25 or 0.5 MG/DOSE) 2 MG/3ML Subcutaneous Solution Pen-injector] 9 mL 0    Sig: INJECT 0.25MG  SUBCUTANEOUSLY ONCE A WEEK FOR 4 WEEKS, THEN INCREASE TO 0.5MG  ONCE A WEEK     Endocrinology:  Diabetes - GLP-1 Receptor Agonists - semaglutide  Failed - 02/24/2024  2:52 PM      Failed - HBA1C in normal range and within 180 days    Hgb A1c MFr Bld  Date Value Ref Range Status  12/15/2023 7.4 (H) <5.7 % of total Hgb Final    Comment:    For someone without known diabetes, a hemoglobin A1c value of 6.5% or greater indicates that they may have  diabetes and this should be confirmed with a follow-up  test. . For someone with known diabetes, a value <7% indicates  that their diabetes is well controlled and a value  greater than or equal to 7% indicates suboptimal  control. A1c targets should be individualized based on  duration of diabetes, age, comorbid conditions, and  other considerations. . Currently, no consensus exists regarding use of hemoglobin A1c for diagnosis of diabetes for children. .          Passed - Cr in normal range and within 360 days    Creat  Date Value Ref Range Status  12/15/2023 0.79 0.50 - 1.05 mg/dL Final   Creatinine, Urine  Date Value Ref Range Status  06/09/2023 210 20 - 275 mg/dL Final         Passed - Valid encounter within last 6 months    Recent Outpatient Visits           1 week ago Acute non-recurrent frontal sinusitis   [facility]  [place], Kayleen Party, DO   2 months ago Type 2 diabetes mellitus with hyperglycemia, without long-term current use of insulin [facility])   [facility] [place], Rankin Buzzard, NP       Future Appointments             In 3 months Baity, Rankin Buzzard, NP [place] [facility], [place]

## 2024-02-29 DIAGNOSIS — G4733 Obstructive sleep apnea (adult) (pediatric): Secondary | ICD-10-CM | POA: Diagnosis not present

## 2024-03-14 ENCOUNTER — Other Ambulatory Visit: Payer: Self-pay | Admitting: Cardiovascular Disease

## 2024-03-14 ENCOUNTER — Other Ambulatory Visit: Payer: Self-pay | Admitting: Internal Medicine

## 2024-03-14 MED ORDER — AMLODIPINE BESYLATE 5 MG PO TABS: 5.0000 mg | ORAL_TABLET | Freq: Every day | ORAL | 3 refills | Status: AC

## 2024-03-14 MED ORDER — SPIRONOLACTONE 25 MG PO TABS: 25.0000 mg | ORAL_TABLET | Freq: Every day | ORAL | 3 refills | Status: AC

## 2024-03-19 ENCOUNTER — Telehealth: Payer: Self-pay | Admitting: Pharmacist

## 2024-03-19 ENCOUNTER — Other Ambulatory Visit

## 2024-03-19 NOTE — Telephone Encounter (Signed)
   Outreach Note  03/19/2024 Name: Shannon Bryan MRN: [id number] DOB: May 21, 1961  Referred by: Carollynn Cirri, NP  Was unable to reach patient via telephone today and have left HIPAA compliant voicemail asking patient to return my call.    Follow Up Plan: Will collaborate with Care Guide to outreach to schedule follow up with me  Arthur Lash, PharmD, [facility] Clinical Pharmacist [facility] [phone]

## 2024-03-30 DIAGNOSIS — G4733 Obstructive sleep apnea (adult) (pediatric): Secondary | ICD-10-CM | POA: Diagnosis not present

## 2024-04-05 ENCOUNTER — Telehealth: Payer: Self-pay

## 2024-04-05 NOTE — Progress Notes (Signed)
 Complex Care Management Care Guide Note  04/05/2024 Name: Shannon Bryan MRN: [id number] DOB: [birth year]  Shannon Bryan is a 63 y.o. year old female who is a primary care patient of Carollynn Cirri, NP and is actively engaged with the care management team. I reached out to Towana Freshwater by phone today to assist with re-scheduling  with the Pharmacist.  Follow up plan: Unsuccessful telephone outreach attempt made. A HIPAA compliant phone message was left for the patient providing contact information and requesting a return call.  Lenton Rail , RMA     [facility]  [facility], [facility] Guide  Direct Dial: [phone]  Website: [email or web address]

## 2024-04-18 NOTE — Progress Notes (Signed)
 Complex Care Management Care Guide Note  04/18/2024 Name: Shannon Bryan MRN: [id number] DOB: 08/10/1961  Shannon Bryan is a 63 y.o. year old female who is a primary care patient of Carollynn Cirri, NP and is actively engaged with the care management team. I reached out to Towana Freshwater by phone today to assist with re-scheduling  with the Pharmacist.  Follow up plan: Unsuccessful telephone outreach attempt made. A HIPAA compliant phone message was left for the patient providing contact information and requesting a return call.  Lenton Rail , RMA     [facility]  [facility], [facility] Guide  Direct Dial: [phone]  Website: [email or web address]

## 2024-04-30 DIAGNOSIS — G4733 Obstructive sleep apnea (adult) (pediatric): Secondary | ICD-10-CM | POA: Diagnosis not present

## 2024-05-01 LAB — HM DIABETES EYE EXAM

## 2024-05-31 ENCOUNTER — Other Ambulatory Visit: Payer: Self-pay | Admitting: Internal Medicine

## 2024-06-01 NOTE — Telephone Encounter (Signed)
 Requested medication (s) are due for refill today: yes  Requested medication (s) are on the active medication list: yes  Last refill:  02/24/24 9 ml  Future visit scheduled: yes  Notes to clinic:  based on last Hgb A1c (abnormal) was pt going to have dose increased before appt   Requested Prescriptions  Pending Prescriptions Disp Refills   OZEMPIC , 0.25 OR 0.5 MG/DOSE, 2 MG/3ML SOPN [Pharmacy Med Name: Ozempic  (0.25 or 0.5 MG/DOSE) 2 MG/3ML Subcutaneous Solution Pen-injector] 9 mL 0    Sig: INJECT 0.5MG  SUBCUTANEOUSLY ONCE A WEEK     Endocrinology:  Diabetes - GLP-1 Receptor Agonists - semaglutide  Failed - 06/01/2024 10:38 AM      Failed - HBA1C in normal range and within 180 days    Hgb A1c MFr Bld  Date Value Ref Range Status  12/15/2023 7.4 (H) <5.7 % of total Hgb Final    Comment:    For someone without known diabetes, a hemoglobin A1c value of 6.5% or greater indicates that they may have  diabetes and this should be confirmed with a follow-up  test. . For someone with known diabetes, a value <7% indicates  that their diabetes is well controlled and a value  greater than or equal to 7% indicates suboptimal  control. A1c targets should be individualized based on  duration of diabetes, age, comorbid conditions, and  other considerations. . Currently, no consensus exists regarding use of hemoglobin A1c for diagnosis of diabetes for children. .          Passed - Cr in normal range and within 360 days    Creat  Date Value Ref Range Status  12/15/2023 0.79 0.50 - 1.05 mg/dL Final   Creatinine, Urine  Date Value Ref Range Status  06/09/2023 210 20 - 275 mg/dL Final         Passed - Valid encounter within last 6 months    Recent Outpatient Visits           3 months ago Acute non-recurrent frontal sinusitis   [place] [facility] [place], Marsa PARAS, DO   5 months ago Type 2 diabetes mellitus with hyperglycemia, without long-term current use of  insulin [facility])   [place] [facility] [place], Angeline ORN, NP       Future Appointments             In 1 week Antonette, Angeline ORN, NP [facility], [facility]

## 2024-06-12 ENCOUNTER — Ambulatory Visit: Payer: BC Managed Care – PPO | Admitting: Internal Medicine

## 2024-06-12 ENCOUNTER — Encounter: Payer: Self-pay | Admitting: Internal Medicine

## 2024-06-12 VITALS — BP 138/76 | HR 62 | Ht 69.0 in | Wt 268.0 lb

## 2024-06-12 DIAGNOSIS — E1165 Type 2 diabetes mellitus with hyperglycemia: Secondary | ICD-10-CM | POA: Diagnosis not present

## 2024-06-12 DIAGNOSIS — I10 Essential (primary) hypertension: Secondary | ICD-10-CM

## 2024-06-12 DIAGNOSIS — N959 Unspecified menopausal and perimenopausal disorder: Secondary | ICD-10-CM

## 2024-06-12 DIAGNOSIS — Z7985 Long-term (current) use of injectable non-insulin antidiabetic drugs: Secondary | ICD-10-CM

## 2024-06-12 DIAGNOSIS — I700 Atherosclerosis of aorta: Secondary | ICD-10-CM

## 2024-06-12 DIAGNOSIS — E785 Hyperlipidemia, unspecified: Secondary | ICD-10-CM

## 2024-06-12 DIAGNOSIS — K219 Gastro-esophageal reflux disease without esophagitis: Secondary | ICD-10-CM

## 2024-06-12 DIAGNOSIS — G4733 Obstructive sleep apnea (adult) (pediatric): Secondary | ICD-10-CM

## 2024-06-12 DIAGNOSIS — E1169 Type 2 diabetes mellitus with other specified complication: Secondary | ICD-10-CM

## 2024-06-12 DIAGNOSIS — Z23 Encounter for immunization: Secondary | ICD-10-CM | POA: Diagnosis not present

## 2024-06-12 DIAGNOSIS — M150 Primary generalized (osteo)arthritis: Secondary | ICD-10-CM

## 2024-06-12 DIAGNOSIS — E1142 Type 2 diabetes mellitus with diabetic polyneuropathy: Secondary | ICD-10-CM

## 2024-06-12 DIAGNOSIS — Z7984 Long term (current) use of oral hypoglycemic drugs: Secondary | ICD-10-CM

## 2024-06-12 MED ORDER — HYDROCHLOROTHIAZIDE 25 MG PO TABS: 25.0000 mg | ORAL_TABLET | Freq: Every day | ORAL | 1 refills | Status: AC

## 2024-06-12 MED ORDER — PAROXETINE HCL 20 MG PO TABS: 20.0000 mg | ORAL_TABLET | Freq: Every morning | ORAL | 1 refills | Status: AC

## 2024-06-12 MED ORDER — FAMOTIDINE 10 MG PO TABS
10.0000 mg | ORAL_TABLET | Freq: Every day | ORAL | Status: AC | PRN
Start: 2024-06-12 — End: ?

## 2024-06-12 NOTE — Progress Notes (Signed)
 Subjective:    Patient ID: Shannon Bryan, female    DOB: October 11, 1961, 63 y.o.   MRN: [id number]  HPI  Patient presents to clinic today for follow-up of chronic conditions.  HTN: Her BP today is 138/76.  She is taking amlodipine , losartan , carvedilol , HCTZ, spironolactone  and potassium as prescribed.  ECG from 11/2023 reviewed.   DM2 with neuropathy: Her last A1c was 7.4 %, 12/2023.  She is taking metformin  and ozempic  as prescribed.  She reports she skipped her metformin  1 time when her sugar was 90 and she felt jittery.  Her sugars range 99-210.  She checks her feet routinely.  Her last eye exam was 05/2024.  Flu 09/2023.  Pneumovax 06/2023.  COVID never.   HLD with aortic atherosclerosis: Her last LDL was 83, triglycerides 895, 09/2023.  She denies myalgias on atorvastatin .  She is taking aspirin as well.  She does not consume a low-fat diet.  GERD: This occurs about 2-3 x week. She is not sure what triggers this. She is taking famotidine  as needed, about 2-3 times a week, with some relief of symptoms. There is no upper GI on file.  OSA: She averages 4 hours of sleep per night with the use of her CPAP.  Sleep study from 07/2021 reviewed.   OA: Mainly in her hips, back and knees.  She takes muscle rub as needed with some relief of symptoms. She uses a heating pad and tens unit.  She follows with orthopedics.  Postmenopausal symptoms: She reports mainly irritability. She is not taking paroxetine  as prescribed because she ran out but she would like to get restarted on this. She is not currently seeing a therapist.  She denies SI/HI.  Review of Systems     Past Medical History:  Diagnosis Date   Asthma    Blood clot in vein    Chronic kidney disease    Diabetes mellitus without complication (HCC)    Hyperlipidemia    Hypertension    Kidney stone    Sleep apnea     Current Outpatient Medications  Medication Sig Dispense Refill   acetaminophen  (TYLENOL ) 500 MG tablet Take 500 mg by  mouth every 6 (six) hours as needed.     albuterol  (VENTOLIN  HFA) 108 (90 Base) MCG/ACT inhaler Inhale 1-2 puffs into the lungs every 4 (four) hours as needed for wheezing or shortness of breath. 18 g 2   amLODipine  (NORVASC ) 5 MG tablet Take 1 tablet (5 mg total) by mouth daily. 90 tablet 3   aspirin EC 81 MG tablet Take 81 mg by mouth daily.     atorvastatin  (LIPITOR) 20 MG tablet Take 1 tablet (20 mg total) by mouth daily. 90 tablet 1   azithromycin  (ZITHROMAX  Z-PAK) 250 MG tablet Take 2 tabs (500mg  total) on Day 1. Take 1 tab (250mg ) daily for next 4 days. 6 tablet 0   benzonatate  (TESSALON ) 100 MG capsule Take 1 capsule (100 mg total) by mouth 3 (three) times daily as needed for cough. 30 capsule 0   Blood Glucose Monitoring Suppl (ONETOUCH VERIO FLEX SYSTEM) w/Device KIT Use to check blood sugar up to twice daily as directed 1 kit 0   carvedilol  (COREG ) 6.25 MG tablet Take 1 tablet (6.25 mg total) by mouth 2 (two) times daily. 180 tablet 3   cetirizine  (ZYRTEC ) 10 MG tablet Take 10 mg by mouth daily.     glucose blood (ONETOUCH VERIO) test strip Use to check blood sugar up to twice daily  200 each 3   hydrochlorothiazide  (HYDRODIURIL ) 25 MG tablet Take 1 tablet (25 mg total) by mouth daily. 90 tablet 3   ibuprofen  (ADVIL ) 800 MG tablet Take 1 tablet by mouth three times daily as needed 90 tablet 0   Lancets (ONETOUCH DELICA PLUS LANCET30G) MISC Use to check blood sugar up to twice daily 200 each 3   losartan  (COZAAR ) 25 MG tablet Take 1 tablet (25 mg total) by mouth daily. 90 tablet 3   meclizine  (ANTIVERT ) 25 MG tablet Take 1 tablet (25 mg total) by mouth 3 (three) times daily as needed for dizziness. 30 tablet 0   metFORMIN  (GLUCOPHAGE ) 1000 MG tablet Take 1 tablet (1,000 mg total) by mouth 2 (two) times daily with a meal. 180 tablet 1   methocarbamol  (ROBAXIN ) 500 MG tablet Take 1 tablet (500 mg total) by mouth 4 (four) times daily. 30 tablet 3   Multiple Vitamins-Minerals (ONE-A-DAY 50  PLUS PO) Take 1 tablet by mouth daily.     nystatin  cream (MYCOSTATIN ) Apply 1 Application topically 3 (three) times daily. 30 g 0   OZEMPIC , 0.25 OR 0.5 MG/DOSE, 2 MG/3ML SOPN INJECT 0.25MG  SUBCUTANEOUSLY ONCE A WEEK FOR 4 WEEKS, THEN INCREASE TO 0.5MG  ONCE A WEEK 9 mL 0   PARoxetine  (PAXIL ) 20 MG tablet TAKE 1 TABLET BY MOUTH ONCE DAILY IN THE MORNING 90 tablet 0   potassium chloride  SA (KLOR-CON  M20) 20 MEQ tablet Take 1 tablet (20 mEq total) by mouth daily. 90 tablet 3   predniSONE  (DELTASONE ) 10 MG tablet Take 6 tabs with breakfast Day 1, 5 tabs Day 2, 4 tabs Day 3, 3 tabs Day 4, 2 tabs Day 5, 1 tab Day 6. 21 tablet 0   spironolactone  (ALDACTONE ) 25 MG tablet Take 1 tablet (25 mg total) by mouth daily. 90 tablet 3   No current facility-administered medications for this visit.    Allergies  Allergen Reactions   Bactrim [Sulfamethoxazole-Trimethoprim] Hives   Penicillins Swelling and Other (See Comments)   Sulfa Antibiotics Other (See Comments)   Cleocin  [Clindamycin  Hcl] Rash    Family History  Problem Relation Age of Onset   Heart disease Mother    Stroke Mother    Diabetes Mother    Cancer Mother        bone   Depression Mother    Diabetes Father    Emphysema Father    Breast cancer Neg Hx     Social History   Socioeconomic History   Marital status: Single    Spouse name: Not on file   Number of children: Not on file   Years of education: Not on file   Highest education level: Not on file  Occupational History   Not on file  Tobacco Use   Smoking status: Some Days    Current packs/day: 0.00    Average packs/day: 0.3 packs/day for 30.0 years (7.5 ttl pk-yrs)    Types: E-cigarettes, Cigarettes    Start date: 02/02/1990    Last attempt to quit: 02/03/2020    Years since quitting: 4.3   Smokeless tobacco: Former   Tobacco comments:    1/4 - 1/2 ppd had quit for 1 year in 2010  Vaping Use   Vaping status: Some Days   Devices: Vaping  Substance and Sexual Activity    Alcohol use: Yes    Alcohol/week: 1.0 standard drink of alcohol    Types: 1 Glasses of wine per week    Comment: occasional   Drug  use: No   Sexual activity: Never  Other Topics Concern   Not on file  Social History Narrative   Not on file   Social Drivers of Health   Financial Resource Strain: Not on file  Food Insecurity: Not on file  Transportation Needs: Not on file  Physical Activity: Not on file  Stress: Not on file  Social Connections: Not on file  Intimate Partner Violence: Not on file     Constitutional: Denies fever, malaise, fatigue, headache or abrupt weight changes.  HEENT: Patient reports lesion inside left lower eyelid.  Denies eye pain, eye redness, ear pain, ringing in the ears, wax buildup, runny nose, nasal congestion, bloody nose, or sore throat. Respiratory: Denies difficulty breathing, shortness of breath, cough or sputum production.   Cardiovascular: Denies chest pain, chest tightness, palpitations or swelling in the hands or feet.  Gastrointestinal: Pt reports intermittent reflux. Denies abdominal pain, bloating, constipation, diarrhea or blood in the stool.  GU: Denies urgency, frequency, pain with urination, burning sensation, blood in urine, odor or discharge. Musculoskeletal: Patient reports joint pain.  Denies decrease in range of motion, difficulty with gait, muscle pain or joint swelling.  Skin: Denies redness, rashes, lesions or ulcercations.  Neurological: Patient reports neuropathic pain.  Denies dizziness, difficulty with memory, difficulty with speech or problems with balance and coordination.  Psych: Pt reports irritability. Denies anxiety, depression, SI/HI.  No other specific complaints in a complete review of systems (except as listed in HPI above).  Objective:   Physical Exam  BP 138/76 (BP Location: Left Arm, Patient Position: Sitting, Cuff Size: Large)   Pulse 62   Ht 5' 9 (1.753 m)   Wt 268 lb (121.6 kg)   SpO2 100%   BMI  39.58 kg/m    Wt Readings from Last 3 Encounters:  02/14/24 264 lb 4 oz (119.9 kg)  12/15/23 280 lb 9.6 oz (127.3 kg)  11/21/23 284 lb (128.8 kg)    General: Appears her stated age, obese, in NAD. Skin: Warm, dry and intact. No ulcerations noted. HEENT: Head: normal shape and size; Eyes: sclera white, no icterus, conjunctiva pink, PERRLA and EOMs intact, small, slightly raised oval lesion noted in the left conjunctival sac;  Cardiovascular: Normal rate and rhythm. S1,S2 noted.  No murmur, rubs or gallops noted. No JVD. No BLE edema. No carotid bruits noted. Pulmonary/Chest: Normal effort and positive vesicular breath sounds. No respiratory distress. No wheezes, rales or ronchi noted.  Abdomen: Normal bowel sounds.  Musculoskeletal: Gait slow and steady without device. Neurological: Alert and oriented. Coordination normal.  Psychiatric: Mood and affect normal. Behavior is normal. Judgment and thought content normal.     BMET    Component Value Date/Time   NA 140 12/15/2023 0827   NA 141 06/11/2021 0927   K 3.6 12/15/2023 0827   CL 103 12/15/2023 0827   CO2 25 12/15/2023 0827   GLUCOSE 130 (H) 12/15/2023 0827   BUN 10 12/15/2023 0827   BUN 12 06/11/2021 0927   CREATININE 0.79 12/15/2023 0827   CALCIUM  9.4 12/15/2023 0827   GFRNONAA >60 03/31/2023 1049   GFRNONAA 70 01/05/2019 1101   GFRAA >60 06/20/2020 1936   GFRAA 81 01/05/2019 1101    Lipid Panel     Component Value Date/Time   CHOL 146 09/14/2023 0853   TRIG 104 09/14/2023 0853   HDL 43 (L) 09/14/2023 0853   CHOLHDL 3.4 09/14/2023 0853   VLDL 20 03/29/2017 0833   LDLCALC 83 09/14/2023 0853  CBC    Component Value Date/Time   WBC 10.6 09/14/2023 0853   RBC 4.69 09/14/2023 0853   HGB 12.8 09/14/2023 0853   HCT 39.5 09/14/2023 0853   PLT 298 09/14/2023 0853   MCV 84.2 09/14/2023 0853   MCH 27.3 09/14/2023 0853   MCHC 32.4 09/14/2023 0853   RDW 15.4 (H) 09/14/2023 0853   LYMPHSABS 2.8 01/18/2023 0814    MONOABS 0.7 01/18/2023 0814   EOSABS 0.1 01/18/2023 0814   BASOSABS 0.0 01/18/2023 0814    Hgb A1C Lab Results  Component Value Date   HGBA1C 7.4 (H) 12/15/2023          Assessment & Plan:     RTC in 3 months for your annual exam Angeline Laura, NP

## 2024-06-12 NOTE — Assessment & Plan Note (Signed)
 Encouraged continued diet and exercise for weight loss

## 2024-06-12 NOTE — Assessment & Plan Note (Signed)
 Encourage weight loss as this can help reduce sleep apnea symptoms Encourage CPAP use

## 2024-06-12 NOTE — Patient Instructions (Signed)

## 2024-06-12 NOTE — Assessment & Plan Note (Signed)
 C-Met and lipid profile reviewed Encouraged her to consume a low-fat diet Continue atorvastatin  20 mg and aspirin 81 mg daily

## 2024-06-12 NOTE — Assessment & Plan Note (Signed)
 Continue amlodipine  5 mg daily, losartan  25 mg daily, carvedilol  6.125 mg twice daily, HCTZ 25 mg daily, spironolactone  25 mg daily and potassium 20 mEq daily Reinforced DASH diet and exercise for weight loss C-Met today

## 2024-06-12 NOTE — Assessment & Plan Note (Signed)
 C-Met and lipid profile reviewed Encouraged her to consume a low-fat diet Continue atorvastatin  20 mg daily

## 2024-06-12 NOTE — Assessment & Plan Note (Signed)
 A1c and urine microalbumin today Encouraged her to consume a low-carb diet and exercise weight loss Continue metformin  1000 mg twice daily and ozempic  0.5 mg weekly Consider increasing Ozempic  to 1 mg weekly pending labs Encouraged routine eye exam Encouraged routine foot exam Flu shot UTD Pneumovax UTD Prevnar 20 today Encouraged her to get her COVID vaccines

## 2024-06-12 NOTE — Assessment & Plan Note (Signed)
 Encourage weight loss as this can help reduce joint pain Continue muscle rubs OTC as needed She will continue to follow with orthopedics

## 2024-06-12 NOTE — Assessment & Plan Note (Signed)
 Will restart paroxetine  20 mg daily We will monitor

## 2024-06-12 NOTE — Assessment & Plan Note (Signed)
 Encourage weight loss as this can help reduce reflux symptoms Try to identify and avoid foods that trigger reflux Continue famotidine  10 mg OTC as needed, advised her if this is occurring >3 days a week, that she would need to take famotidine  daily

## 2024-06-13 ENCOUNTER — Ambulatory Visit: Payer: Self-pay | Admitting: Internal Medicine

## 2024-06-13 LAB — LIPID PANEL
Cholesterol: 186 mg/dL (ref <200)
HDL: 40 mg/dL — ABNORMAL LOW (ref >=50)
LDL Cholesterol (Calc): 124 mg/dL — ABNORMAL HIGH
Non-HDL Cholesterol (Calc): 146 mg/dL — ABNORMAL HIGH (ref <130)
Total CHOL/HDL Ratio: 4.7 (calc) (ref <5.0)
Triglycerides: 113 mg/dL (ref <150)

## 2024-06-13 LAB — COMPREHENSIVE METABOLIC PANEL WITH GFR
AG Ratio: 1.5 (calc) (ref 1.0–2.5)
ALT: 13 U/L (ref 6–29)
AST: 13 U/L (ref 10–35)
Albumin: 4.1 g/dL (ref 3.6–5.1)
Alkaline phosphatase (APISO): 74 U/L (ref 37–153)
BUN: 10 mg/dL (ref 7–25)
CO2: 27 mmol/L (ref 20–32)
Calcium: 9.3 mg/dL (ref 8.6–10.4)
Chloride: 104 mmol/L (ref 98–110)
Creat: 0.82 mg/dL (ref 0.50–1.05)
Globulin: 2.7 g/dL (ref 1.9–3.7)
Glucose, Bld: 140 mg/dL — ABNORMAL HIGH (ref 65–99)
Potassium: 3.6 mmol/L (ref 3.5–5.3)
Sodium: 140 mmol/L (ref 135–146)
Total Bilirubin: 0.3 mg/dL (ref 0.2–1.2)
Total Protein: 6.8 g/dL (ref 6.1–8.1)
eGFR: 81 mL/min/1.73m2 (ref >=60)

## 2024-06-13 LAB — CBC
HCT: 41.7 % (ref 35.0–45.0)
Hemoglobin: 13.4 g/dL (ref 11.7–15.5)
MCH: 27.2 pg (ref 27.0–33.0)
MCHC: 32.1 g/dL (ref 32.0–36.0)
MCV: 84.8 fL (ref 80.0–100.0)
MPV: 10.7 fL (ref 7.5–12.5)
Platelets: 277 Thousand/uL (ref 140–400)
RBC: 4.92 Million/uL (ref 3.80–5.10)
RDW: 14.3 % (ref 11.0–15.0)
WBC: 9.7 Thousand/uL (ref 3.8–10.8)

## 2024-06-13 LAB — MICROALBUMIN / CREATININE URINE RATIO
Creatinine, Urine: 66 mg/dL (ref 20–275)
Microalb, Ur: <0.2 mg/dL

## 2024-06-13 LAB — HEMOGLOBIN A1C
Hgb A1c MFr Bld: 6.1 % — ABNORMAL HIGH (ref <5.7)
Mean Plasma Glucose: 128 mg/dL
eAG (mmol/L): 7.1 mmol/L

## 2024-06-13 MED ORDER — ATORVASTATIN CALCIUM 40 MG PO TABS: 40.0000 mg | ORAL_TABLET | Freq: Every day | ORAL | 1 refills | Status: AC

## 2024-06-18 MED ORDER — OZEMPIC (1 MG/DOSE) 4 MG/3ML ~~LOC~~ SOPN: 1.0000 mg | PEN_INJECTOR | SUBCUTANEOUS | 1 refills | Status: AC

## 2024-06-18 NOTE — Addendum Note (Signed)
 Addended by: ANTONETTE ANGELINE ORN on: 06/18/2024 11:27 AM   Modules accepted: Orders

## 2024-07-24 DIAGNOSIS — G4733 Obstructive sleep apnea (adult) (pediatric): Secondary | ICD-10-CM | POA: Diagnosis not present

## 2024-09-21 ENCOUNTER — Other Ambulatory Visit (HOSPITAL_COMMUNITY)
Admission: RE | Admit: 2024-09-21 | Discharge: 2024-09-21 | Disposition: A | Discharge status: Home / Self Care | Source: Ambulatory Visit | Attending: Internal Medicine | Admitting: Internal Medicine

## 2024-09-21 ENCOUNTER — Encounter: Payer: Self-pay | Admitting: Internal Medicine

## 2024-09-21 ENCOUNTER — Ambulatory Visit (INDEPENDENT_AMBULATORY_CARE_PROVIDER_SITE_OTHER): Admitting: Internal Medicine

## 2024-09-21 VITALS — BP 132/74 | Ht 69.0 in | Wt 255.8 lb

## 2024-09-21 DIAGNOSIS — Z124 Encounter for screening for malignant neoplasm of cervix: Secondary | ICD-10-CM | POA: Diagnosis not present

## 2024-09-21 DIAGNOSIS — J329 Chronic sinusitis, unspecified: Secondary | ICD-10-CM | POA: Diagnosis not present

## 2024-09-21 DIAGNOSIS — B9789 Other viral agents as the cause of diseases classified elsewhere: Secondary | ICD-10-CM

## 2024-09-21 DIAGNOSIS — Z6837 Body mass index (BMI) 37.0-37.9, adult: Secondary | ICD-10-CM

## 2024-09-21 DIAGNOSIS — Z7985 Long-term (current) use of injectable non-insulin antidiabetic drugs: Secondary | ICD-10-CM

## 2024-09-21 DIAGNOSIS — Z1231 Encounter for screening mammogram for malignant neoplasm of breast: Secondary | ICD-10-CM

## 2024-09-21 DIAGNOSIS — Z0001 Encounter for general adult medical examination with abnormal findings: Secondary | ICD-10-CM

## 2024-09-21 DIAGNOSIS — E1142 Type 2 diabetes mellitus with diabetic polyneuropathy: Secondary | ICD-10-CM | POA: Diagnosis not present

## 2024-09-21 DIAGNOSIS — Z780 Asymptomatic menopausal state: Secondary | ICD-10-CM

## 2024-09-21 DIAGNOSIS — Z7984 Long term (current) use of oral hypoglycemic drugs: Secondary | ICD-10-CM

## 2024-09-21 DIAGNOSIS — E66812 Obesity, class 2: Secondary | ICD-10-CM

## 2024-09-21 LAB — COMPREHENSIVE METABOLIC PANEL WITH GFR
AG Ratio: 1.5 (calc) (ref 1.0–2.5)
ALT: 10 U/L (ref 6–29)
AST: 13 U/L (ref 10–35)
Albumin: 4.1 g/dL (ref 3.6–5.1)
Alkaline phosphatase (APISO): 71 U/L (ref 37–153)
BUN: 9 mg/dL (ref 7–25)
CO2: 24 mmol/L (ref 20–32)
Calcium: 9.1 mg/dL (ref 8.6–10.4)
Chloride: 106 mmol/L (ref 98–110)
Creat: 0.84 mg/dL (ref 0.50–1.05)
Globulin: 2.7 g/dL (ref 1.9–3.7)
Glucose, Bld: 96 mg/dL (ref 65–99)
Potassium: 3.7 mmol/L (ref 3.5–5.3)
Sodium: 139 mmol/L (ref 135–146)
Total Bilirubin: 0.4 mg/dL (ref 0.2–1.2)
Total Protein: 6.8 g/dL (ref 6.1–8.1)
eGFR: 79 mL/min/1.73m2 (ref >=60)

## 2024-09-21 LAB — LIPID PANEL
Cholesterol: 138 mg/dL (ref <200)
HDL: 41 mg/dL — ABNORMAL LOW (ref >=50)
LDL Cholesterol (Calc): 79 mg/dL
Non-HDL Cholesterol (Calc): 97 mg/dL (ref <130)
Total CHOL/HDL Ratio: 3.4 (calc) (ref <5.0)
Triglycerides: 94 mg/dL (ref <150)

## 2024-09-21 LAB — CBC
HCT: 43.4 % (ref 35.0–45.0)
Hemoglobin: 13.9 g/dL (ref 11.7–15.5)
MCH: 26.9 pg — ABNORMAL LOW (ref 27.0–33.0)
MCHC: 32 g/dL (ref 32.0–36.0)
MCV: 84.1 fL (ref 80.0–100.0)
MPV: 11.2 fL (ref 7.5–12.5)
Platelets: 260 Thousand/uL (ref 140–400)
RBC: 5.16 Million/uL — ABNORMAL HIGH (ref 3.80–5.10)
RDW: 14.7 % (ref 11.0–15.0)
WBC: 7.7 Thousand/uL (ref 3.8–10.8)

## 2024-09-21 LAB — HEMOGLOBIN A1C
Hgb A1c MFr Bld: 5.6 % (ref <5.7)
Mean Plasma Glucose: 114 mg/dL
eAG (mmol/L): 6.3 mmol/L

## 2024-09-21 MED ORDER — PREDNISONE 10 MG PO TABS: ORAL_TABLET | ORAL | 0 refills | Status: AC

## 2024-09-21 MED ORDER — IBUPROFEN 800 MG PO TABS: 800.0000 mg | ORAL_TABLET | Freq: Three times a day (TID) | ORAL | 0 refills | Status: AC | PRN

## 2024-09-21 MED ORDER — CARVEDILOL 6.25 MG PO TABS: 6.2500 mg | ORAL_TABLET | Freq: Two times a day (BID) | ORAL | 1 refills | Status: AC

## 2024-09-21 MED ORDER — LOSARTAN POTASSIUM 25 MG PO TABS: 25.0000 mg | ORAL_TABLET | Freq: Every day | ORAL | 1 refills | Status: AC

## 2024-09-21 NOTE — Progress Notes (Signed)
 Subjective:    Patient ID: Shannon Bryan, female    DOB: [birth year], 63 y.o.   MRN: [id number]  HPI  Patient presents to clinic today for her annual exam.  She also reports right ear pain, right sided facial pressure and nasal congestion.  She reports this started 1 week ago.  She reports she is not able to blow anything out of her nose.  She denies headache, runny nose, sore throat.  She does have a slight cough.  She denies shortness of breath, nausea, vomiting, diarrhea.  She denies fever, chills or bodyaches.  She has tried Flonase  with minimal relief of symptoms.  Flu: 08/2024 Tetanus: 09/2023 COVID: x 4 Pneumovax: 06/2023 Prevnar: 06/2024 Shingrix : 12/2018, 07/2019 Pap smear: Hysterectomy Mammogram: 07/2023 Bone density: never Colon screening: 04/2022 Vision screening: annually, [place] [facility]ist: as needed  Diet: She does eat some meat. She consumes fruits and veggies. She does not eat fried foods. She drinks mostly coffee, water. Exercise: water, juice  Review of Systems     Past Medical History:  Diagnosis Date   Asthma    Blood clot in vein    Chronic kidney disease    Diabetes mellitus without complication (HCC)    Hyperlipidemia    Hypertension    Kidney stone    Sleep apnea     Current Outpatient Medications  Medication Sig Dispense Refill   acetaminophen  (TYLENOL ) 500 MG tablet Take 500 mg by mouth every 6 (six) hours as needed.     albuterol  (VENTOLIN  HFA) 108 (90 Base) MCG/ACT inhaler Inhale 1-2 puffs into the lungs every 4 (four) hours as needed for wheezing or shortness of breath. 18 g 2   amLODipine  (NORVASC ) 5 MG tablet Take 1 tablet (5 mg total) by mouth daily. 90 tablet 3   aspirin EC 81 MG tablet Take 81 mg by mouth daily.     atorvastatin  (LIPITOR) 40 MG tablet Take 1 tablet (40 mg total) by mouth daily. 90 tablet 1   Blood Glucose Monitoring Suppl (ONETOUCH VERIO FLEX SYSTEM) w/Device KIT Use to check blood sugar up to twice daily as directed 1 kit  0   carvedilol  (COREG ) 6.25 MG tablet Take 1 tablet (6.25 mg total) by mouth 2 (two) times daily. 180 tablet 3   cetirizine  (ZYRTEC ) 10 MG tablet Take 10 mg by mouth daily.     famotidine  (PEPCID ) 10 MG tablet Take 1 tablet (10 mg total) by mouth daily as needed for heartburn or indigestion.     glucose blood (ONETOUCH VERIO) test strip Use to check blood sugar up to twice daily 200 each 3   hydrochlorothiazide  (HYDRODIURIL ) 25 MG tablet Take 1 tablet (25 mg total) by mouth daily. 90 tablet 1   ibuprofen  (ADVIL ) 800 MG tablet Take 1 tablet by mouth three times daily as needed 90 tablet 0   Lancets (ONETOUCH DELICA PLUS LANCET30G) MISC Use to check blood sugar up to twice daily 200 each 3   losartan  (COZAAR ) 25 MG tablet Take 1 tablet (25 mg total) by mouth daily. 90 tablet 3   metFORMIN  (GLUCOPHAGE ) 1000 MG tablet Take 1 tablet (1,000 mg total) by mouth 2 (two) times daily with a meal. (Patient taking differently: Take 1,000 mg by mouth as needed.) 180 tablet 1   Multiple Vitamins-Minerals (ONE-A-DAY 50 PLUS PO) Take 1 tablet by mouth daily.     OZEMPIC , 1 MG/DOSE, 4 MG/3ML SOPN Inject 1 mg into the skin once a week. 9 mL 1  PARoxetine  (PAXIL ) 20 MG tablet Take 1 tablet (20 mg total) by mouth every morning. 90 tablet 1   potassium chloride  SA (KLOR-CON  M20) 20 MEQ tablet Take 1 tablet (20 mEq total) by mouth daily. 90 tablet 3   spironolactone  (ALDACTONE ) 25 MG tablet Take 1 tablet (25 mg total) by mouth daily. 90 tablet 3   No current facility-administered medications for this visit.    Allergies  Allergen Reactions   Bactrim [Sulfamethoxazole-Trimethoprim] Hives   Penicillins Swelling and Other (See Comments)   Sulfa Antibiotics Other (See Comments)   Cleocin  [Clindamycin  Hcl] Rash    Family History  Problem Relation Age of Onset   Heart disease Mother    Stroke Mother    Diabetes Mother    Cancer Mother        bone   Depression Mother    Diabetes Father    Emphysema Father     Breast cancer Neg Hx     Social History   Socioeconomic History   Marital status: Single    Spouse name: Not on file   Number of children: Not on file   Years of education: Not on file   Highest education level: Not on file  Occupational History   Not on file  Tobacco Use   Smoking status: Former    Current packs/day: 0.00    Average packs/day: 0.3 packs/day for 30.0 years (7.5 ttl pk-yrs)    Types: E-cigarettes, Cigarettes    Start date: 02/02/1990    Quit date: 02/03/2020    Years since quitting: 4.6   Smokeless tobacco: Former   Tobacco comments:    1/4 - 1/2 ppd had quit for 1 year in 2010  Vaping Use   Vaping status: Every Day   Devices: Vaping  Substance and Sexual Activity   Alcohol use: Yes    Alcohol/week: 1.0 standard drink of alcohol    Types: 1 Glasses of wine per week    Comment: occasional   Drug use: No   Sexual activity: Never  Other Topics Concern   Not on file  Social History Narrative   Not on file   Social Drivers of Health   Financial Resource Strain: Not on file  Food Insecurity: Not on file  Transportation Needs: Not on file  Physical Activity: Not on file  Stress: Not on file  Social Connections: Not on file  Intimate Partner Violence: Not on file     Constitutional: Denies fever, malaise, fatigue, headache or abrupt weight changes.  HEENT: Patient reports sinus pressure, ear pain and nasal congestion.  Denies eye pain, eye redness, ear pain, ringing in the ears, wax buildup, runny nose, bloody nose, or sore throat. Respiratory: Patient reports cough.  Denies difficulty breathing, shortness of breath, or sputum production.   Cardiovascular: Denies chest pain, chest tightness, palpitations or swelling in the hands or feet.  Gastrointestinal: Pt reports reflux. Denies abdominal pain, bloating, constipation, diarrhea or blood in the stool.  GU: Denies urgency, frequency, pain with urination, burning sensation, blood in urine, odor or  discharge. Musculoskeletal: Patient reports joint pain.  Denies decrease in range of motion, difficulty with gait, muscle pain or joint swelling.  Skin: Denies redness, rashes, lesions or ulcercations.  Neurological: Pt reports neuropathic pain. Denies dizziness, difficulty with memory, difficulty with speech or problems with balance and coordination.  Psych: Denies anxiety, depression, SI/HI.  No other specific complaints in a complete review of systems (except as listed in HPI above).  Objective:  Physical Exam BP 132/74 (BP Location: Left Arm, Patient Position: Sitting, Cuff Size: Large)   Ht 5' 9 (1.753 m)   Wt 255 lb 12.8 oz (116 kg)   BMI 37.78 kg/m   Wt Readings from Last 3 Encounters:  06/12/24 268 lb (121.6 kg)  02/14/24 264 lb 4 oz (119.9 kg)  12/15/23 280 lb 9.6 oz (127.3 kg)    General: Appears her stated age, obese, in NAD. Skin: Warm, dry and intact. No ulcerations noted. HEENT: Head: normal shape and size, right maxillary sinus tenderness noted; Eyes: sclera white, no icterus, conjunctiva pink, PERRLA and EOMs intact; Ears: TMs gray and intact, normal light reflex; Nose: mucosa dry and boggy, turbinates swollen. Neck:  Neck supple, trachea midline. No masses, lumps or thyromegaly present.  Cardiovascular: Normal rate and rhythm. S1,S2 noted.  No murmur, rubs or gallops noted. No JVD. Trace pitting BLE edema. No carotid bruits noted. Pulmonary/Chest: Normal effort and positive vesicular breath sounds. No respiratory distress. No wheezes, rales or ronchi noted.  Abdomen: Soft and nontender. Normal bowel sounds.  Pelvic: Normal female anatomy.  Cervix without mass or lesion.  No CMT.  Adnexa nonpalpable. Musculoskeletal: Strength 5/5 BUE/BLE. Gait slow and steady without device. Neurological: Alert and oriented. Cranial nerves II-XII grossly intact. Coordination normal.  Psychiatric: Mood and affect normal. Behavior is normal. Judgment and thought content normal.      BMET    Component Value Date/Time   NA 140 06/12/2024 0903   NA 141 06/11/2021 0927   K 3.6 06/12/2024 0903   CL 104 06/12/2024 0903   CO2 27 06/12/2024 0903   GLUCOSE 140 (H) 06/12/2024 0903   BUN 10 06/12/2024 0903   BUN 12 06/11/2021 0927   CREATININE 0.82 06/12/2024 0903   CALCIUM  9.3 06/12/2024 0903   GFRNONAA >60 03/31/2023 1049   GFRNONAA 70 01/05/2019 1101   GFRAA >60 06/20/2020 1936   GFRAA 81 01/05/2019 1101    Lipid Panel     Component Value Date/Time   CHOL 186 06/12/2024 0903   TRIG 113 06/12/2024 0903   HDL 40 (L) 06/12/2024 0903   CHOLHDL 4.7 06/12/2024 0903   VLDL 20 03/29/2017 0833   LDLCALC 124 (H) 06/12/2024 0903    CBC    Component Value Date/Time   WBC 9.7 06/12/2024 0903   RBC 4.92 06/12/2024 0903   HGB 13.4 06/12/2024 0903   HCT 41.7 06/12/2024 0903   PLT 277 06/12/2024 0903   MCV 84.8 06/12/2024 0903   MCH 27.2 06/12/2024 0903   MCHC 32.1 06/12/2024 0903   RDW 14.3 06/12/2024 0903   LYMPHSABS 2.8 01/18/2023 0814   MONOABS 0.7 01/18/2023 0814   EOSABS 0.1 01/18/2023 0814   BASOSABS 0.0 01/18/2023 0814    Hgb A1C Lab Results  Component Value Date   HGBA1C 6.1 (H) 06/12/2024          Assessment & Plan:   Preventative health maintenance:  Flu shot UTD Tetanus UTD Encouraged her to get her COVID-vaccine Pneumovax and Prevnar UTD Shingrix  UTD Pap smear today Mammogram ordered-she will call to schedule Bone density ordered-she will call to schedule Colon screening UTD Encouraged her to consume a balanced diet and exercise regimen Advised her to see an eye doctor and dentist annually We will check CBC, c-Met, lipid, A1c today  Viral sinusitis:  Encouraged rest and fluids Can use a Nettie pot which can be purchased from her local pharmacy She feels like Flonase  has made her symptoms worse Rx  for Pred 10 mg taper x 6 days for symptom management  RTC in 6 months, follow-up chronic conditions Angeline Laura, NP

## 2024-09-21 NOTE — Patient Instructions (Signed)
 Health Maintenance for Postmenopausal Women Menopause is a normal process in which your ability to get pregnant comes to an end. This process happens slowly over many months or years, usually between the ages of 76 and 38. Menopause is complete when you have missed your menstrual period for 12 months. It is important to talk with your health care provider about some of the most common conditions that affect women after menopause (postmenopausal women). These include heart disease, cancer, and bone loss (osteoporosis). Adopting a healthy lifestyle and getting preventive care can help to promote your health and wellness. The actions you take can also lower your chances of developing some of these common conditions. What are the signs and symptoms of menopause? During menopause, you may have the following symptoms: Hot flashes. These can be moderate or severe. Night sweats. Decrease in sex drive. Mood swings. Headaches. Tiredness (fatigue). Irritability. Memory problems. Problems falling asleep or staying asleep. Talk with your health care provider about treatment options for your symptoms. Do I need hormone replacement therapy? Hormone replacement therapy is effective in treating symptoms that are caused by menopause, such as hot flashes and night sweats. Hormone replacement carries certain risks, especially as you become older. If you are thinking about using estrogen or estrogen with progestin, discuss the benefits and risks with your health care provider. How can I reduce my risk for heart disease and stroke? The risk of heart disease, heart attack, and stroke increases as you age. One of the causes may be a change in the body's hormones during menopause. This can affect how your body uses dietary fats, triglycerides, and cholesterol. Heart attack and stroke are medical emergencies. There are many things that you can do to help prevent heart disease and stroke. Watch your blood pressure High  blood pressure causes heart disease and increases the risk of stroke. This is more likely to develop in people who have high blood pressure readings or are overweight. Have your blood pressure checked: Every 3-5 years if you are 32-23 years of age. Every year if you are 31 years old or older. Eat a healthy diet  Eat a diet that includes plenty of vegetables, fruits, low-fat dairy products, and lean protein. Do not eat a lot of foods that are high in solid fats, added sugars, or sodium. Get regular exercise Get regular exercise. This is one of the most important things you can do for your health. Most adults should: Try to exercise for at least 150 minutes each week. The exercise should increase your heart rate and make you sweat (moderate-intensity exercise). Try to do strengthening exercises at least twice each week. Do these in addition to the moderate-intensity exercise. Spend less time sitting. Even light physical activity can be beneficial. Other tips Work with your health care provider to achieve or maintain a healthy weight. Do not use any products that contain nicotine or tobacco. These products include cigarettes, chewing tobacco, and vaping devices, such as e-cigarettes. If you need help quitting, ask your health care provider. Know your numbers. Ask your health care provider to check your cholesterol and your blood sugar (glucose). Continue to have your blood tested as directed by your health care provider. Do I need screening for cancer? Depending on your health history and family history, you may need to have cancer screenings at different stages of your life. This may include screening for: Breast cancer. Cervical cancer. Lung cancer. Colorectal cancer. What is my risk for osteoporosis? After menopause, you may be  at increased risk for osteoporosis. Osteoporosis is a condition in which bone destruction happens more quickly than new bone creation. To help prevent osteoporosis or  the bone fractures that can happen because of osteoporosis, you may take the following actions: If you are 24-54 years old, get at least 1,000 mg of calcium and at least 600 international units (IU) of vitamin D  per day. If you are older than age 75 but younger than age 30, get at least 1,200 mg of calcium and at least 600 international units (IU) of vitamin D  per day. If you are older than age 8, get at least 1,200 mg of calcium and at least 800 international units (IU) of vitamin D  per day. Smoking and drinking excessive alcohol increase the risk of osteoporosis. Eat foods that are rich in calcium and vitamin D , and do weight-bearing exercises several times each week as directed by your health care provider. How does menopause affect my mental health? Depression may occur at any age, but it is more common as you become older. Common symptoms of depression include: Feeling depressed. Changes in sleep patterns. Changes in appetite or eating patterns. Feeling an overall lack of motivation or enjoyment of activities that you previously enjoyed. Frequent crying spells. Talk with your health care provider if you think that you are experiencing any of these symptoms. General instructions See your health care provider for regular wellness exams and vaccines. This may include: Scheduling regular health, dental, and eye exams. Getting and maintaining your vaccines. These include: Influenza vaccine. Get this vaccine each year before the flu season begins. Pneumonia vaccine. Shingles vaccine. Tetanus, diphtheria, and pertussis (Tdap) booster vaccine. Your health care provider may also recommend other immunizations. Tell your health care provider if you have ever been abused or do not feel safe at home. Summary Menopause is a normal process in which your ability to get pregnant comes to an end. This condition causes hot flashes, night sweats, decreased interest in sex, mood swings, headaches, or lack  of sleep. Treatment for this condition may include hormone replacement therapy. Take actions to keep yourself healthy, including exercising regularly, eating a healthy diet, watching your weight, and checking your blood pressure and blood sugar levels. Get screened for cancer and depression. Make sure that you are up to date with all your vaccines. This information is not intended to replace advice given to you by your health care provider. Make sure you discuss any questions you have with your health care provider. Document Revised: 03/09/2021 Document Reviewed: 03/09/2021 Elsevier Patient Education  2024 [employer].

## 2024-09-21 NOTE — Assessment & Plan Note (Signed)
 Encouraged diet and exercise for weight loss ?

## 2024-09-23 DIAGNOSIS — G4733 Obstructive sleep apnea (adult) (pediatric): Secondary | ICD-10-CM | POA: Diagnosis not present

## 2024-09-24 ENCOUNTER — Encounter: Payer: Self-pay | Admitting: Internal Medicine

## 2024-09-24 ENCOUNTER — Ambulatory Visit: Payer: Self-pay | Admitting: Internal Medicine

## 2024-09-25 LAB — CYTOLOGY - PAP
Adequacy: ABSENT
Comment: NEGATIVE
Comment: NEGATIVE
Comment: NEGATIVE
Diagnosis: UNDETERMINED — AB
HPV 16: NEGATIVE
HPV 18 / 45: NEGATIVE
High risk HPV: POSITIVE — AB

## 2024-11-07 ENCOUNTER — Ambulatory Visit
Admission: RE | Admit: 2024-11-07 | Discharge: 2024-11-07 | Disposition: A | Discharge status: Home / Self Care | Source: Ambulatory Visit | Attending: Internal Medicine | Admitting: Internal Medicine

## 2024-11-07 DIAGNOSIS — Z780 Asymptomatic menopausal state: Secondary | ICD-10-CM

## 2024-11-07 DIAGNOSIS — Z1231 Encounter for screening mammogram for malignant neoplasm of breast: Secondary | ICD-10-CM | POA: Insufficient documentation

## 2024-11-12 ENCOUNTER — Telehealth: Payer: Self-pay

## 2024-11-12 ENCOUNTER — Other Ambulatory Visit (HOSPITAL_COMMUNITY): Payer: Self-pay

## 2024-11-12 NOTE — Telephone Encounter (Signed)
 Pharmacy Patient Advocate Encounter   Received notification from [facility] KEY that prior authorization for Ozempic  4 is required/requested.   Insurance verification completed.   The patient is insured through [facility].   Per test claim: PA required; PA submitted to above mentioned insurance via Latent Key/confirmation #/EOC [id number] Status is pending

## 2024-11-19 ENCOUNTER — Telehealth: Payer: Self-pay

## 2024-11-19 ENCOUNTER — Other Ambulatory Visit (HOSPITAL_COMMUNITY): Payer: Self-pay

## 2024-11-19 NOTE — Telephone Encounter (Signed)
 Pharmacy Patient Advocate Encounter  Received notification from [facility] that Prior Authorization for Ozempic  4 has been APPROVED from 11/12/24 to 11/12/25. Ran test claim, Copay is $49.99. This test claim was processed through [facility]- copay amounts may vary at other pharmacies due to pharmacy/plan contracts, or as the patient moves through the different stages of their insurance plan.   PA #/Case ID/Reference #: # [id number]

## 2025-03-22 ENCOUNTER — Ambulatory Visit: Admitting: Internal Medicine
